# Patient Record
Sex: Female | Born: 1935 | Race: White | Hispanic: No | Smoking: Former smoker
Health system: Southern US, Community
[De-identification: ages and names within clinical notes are randomized; demographics above are authoritative.]

## PROBLEM LIST (undated history)

## (undated) DIAGNOSIS — J449 Chronic obstructive pulmonary disease, unspecified: Secondary | ICD-10-CM

## (undated) DIAGNOSIS — F329 Major depressive disorder, single episode, unspecified: Secondary | ICD-10-CM

## (undated) DIAGNOSIS — I10 Essential (primary) hypertension: Secondary | ICD-10-CM

## (undated) DIAGNOSIS — T7840XA Allergy, unspecified, initial encounter: Secondary | ICD-10-CM

## (undated) DIAGNOSIS — F419 Anxiety disorder, unspecified: Secondary | ICD-10-CM

## (undated) DIAGNOSIS — F32A Depression, unspecified: Secondary | ICD-10-CM

## (undated) HISTORY — DX: Anxiety disorder, unspecified: F41.9

## (undated) HISTORY — DX: Depression, unspecified: F32.A

## (undated) HISTORY — DX: Essential (primary) hypertension: I10

## (undated) HISTORY — DX: Chronic obstructive pulmonary disease, unspecified: J44.9

## (undated) HISTORY — PX: ABDOMINAL HYSTERECTOMY: SHX81

## (undated) HISTORY — DX: Allergy, unspecified, initial encounter: T78.40XA

---
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
## 2002-06-13 ENCOUNTER — Encounter: Admission: RE | Admit: 2002-06-13 | Discharge: 2002-06-13 | Payer: Self-pay | Admitting: Family Medicine

## 2002-06-13 ENCOUNTER — Encounter: Payer: Self-pay | Admitting: Family Medicine

## 2016-04-04 ENCOUNTER — Other Ambulatory Visit: Payer: Self-pay | Admitting: *Deleted

## 2016-04-07 MED ORDER — ALPRAZOLAM 0.5 MG PO TABS: 0.5000 mg | ORAL_TABLET | Freq: Two times a day (BID) | ORAL | 1 refills | Status: DC | PRN

## 2016-09-15 ENCOUNTER — Other Ambulatory Visit: Payer: Self-pay | Admitting: Physician Assistant

## 2016-09-25 NOTE — Patient Instructions (Signed)
Your procedure is scheduled on: 10/03/2016  Report to [facility] at  46  AM.  Call this number if you have problems the morning of surgery: [phone]   Do not eat food or drink liquids :After Midnight.      Take these medicines the morning of surgery with A SIP OF WATER: Norvasc and xanax (if needed).   Do not wear jewelry, make-up or nail polish.  Do not wear lotions, powders, or perfumes. You may wear deodorant.  Do not shave 48 hours prior to surgery.  Do not bring valuables to the hospital.  Contacts, dentures or bridgework may not be worn into surgery.  Leave suitcase in the car. After surgery it may be brought to your room.  For patients admitted to the hospital, checkout time is 11:00 AM the day of discharge.   Patients discharged the day of surgery will not be allowed to drive home.  :     Please read over the following fact sheets that you were given: Coughing and Deep Breathing, Surgical Site Infection Prevention, Anesthesia Post-op Instructions and Care and Recovery After Surgery    Cataract A cataract is a clouding of the lens of the eye. When a lens becomes cloudy, vision is reduced based on the degree and nature of the clouding. Many cataracts reduce vision to some degree. Some cataracts make people more near-sighted as they develop. Other cataracts increase glare. Cataracts that are ignored and become worse can sometimes look white. The white color can be seen through the pupil. CAUSES   Aging. However, cataracts may occur at any age, even in newborns.   Certain drugs.   Trauma to the eye.   Certain diseases such as diabetes.   Specific eye diseases such as chronic inflammation inside the eye or a sudden attack of a rare form of glaucoma.   Inherited or acquired medical problems.  SYMPTOMS   Gradual, progressive drop in vision in the affected eye.   Severe, rapid visual loss. This most often happens when trauma is the cause.  DIAGNOSIS  To detect a cataract,  an eye doctor examines the lens. Cataracts are best diagnosed with an exam of the eyes with the pupils enlarged (dilated) by drops.  TREATMENT  For an early cataract, vision may improve by using different eyeglasses or stronger lighting. If that does not help your vision, surgery is the only effective treatment. A cataract needs to be surgically removed when vision loss interferes with your everyday activities, such as driving, reading, or watching TV. A cataract may also have to be removed if it prevents examination or treatment of another eye problem. Surgery removes the cloudy lens and usually replaces it with a substitute lens (intraocular lens, IOL).  At a time when both you and your doctor agree, the cataract will be surgically removed. If you have cataracts in both eyes, only one is usually removed at a time. This allows the operated eye to heal and be out of danger from any possible problems after surgery (such as infection or poor wound healing). In rare cases, a cataract may be doing damage to your eye. In these cases, your caregiver may advise surgical removal right away. The vast majority of people who have cataract surgery have better vision afterward. HOME CARE INSTRUCTIONS  If you are not planning surgery, you may be asked to do the following:  Use different eyeglasses.   Use stronger or brighter lighting.   Ask your eye doctor about reducing  your medicine dose or changing medicines if it is thought that a medicine caused your cataract. Changing medicines does not make the cataract go away on its own.   Become familiar with your surroundings. Poor vision can lead to injury. Avoid bumping into things on the affected side. You are at a higher risk for tripping or falling.   Exercise extreme care when driving or operating machinery.   Wear sunglasses if you are sensitive to bright light or experiencing problems with glare.  SEEK IMMEDIATE MEDICAL CARE IF:   You have a worsening or  sudden vision loss.   You notice redness, swelling, or increasing pain in the eye.   You have a fever.  Document Released: 07/07/2005 Document Revised: 06/26/2011 Document Reviewed: 02/28/2011 [facility] Patient Information 2012 [place].PATIENT INSTRUCTIONS POST-ANESTHESIA  IMMEDIATELY FOLLOWING SURGERY:  Do not drive or operate machinery for the first twenty four hours after surgery.  Do not make any important decisions for twenty four hours after surgery or while taking narcotic pain medications or sedatives.  If you develop intractable nausea and vomiting or a severe headache please notify your doctor immediately.  FOLLOW-UP:  Please make an appointment with your surgeon as instructed. You do not need to follow up with anesthesia unless specifically instructed to do so.  WOUND CARE INSTRUCTIONS (if applicable):  Keep a dry clean dressing on the anesthesia/puncture wound site if there is drainage.  Once the wound has quit draining you may leave it open to air.  Generally you should leave the bandage intact for twenty four hours unless there is drainage.  If the epidural site drains for more than 36-48 hours please call the anesthesia department.  QUESTIONS?:  Please feel free to call your physician or the hospital operator if you have any questions, and they will be happy to assist you.

## 2016-09-26 ENCOUNTER — Encounter: Payer: Self-pay | Admitting: Physician Assistant

## 2016-09-26 ENCOUNTER — Ambulatory Visit (INDEPENDENT_AMBULATORY_CARE_PROVIDER_SITE_OTHER): Payer: Medicare Other | Admitting: Physician Assistant

## 2016-09-26 VITALS — BP 142/76 | HR 103 | Temp 96.7°F | Ht 61.0 in | Wt 109.8 lb

## 2016-09-26 DIAGNOSIS — N3000 Acute cystitis without hematuria: Secondary | ICD-10-CM | POA: Diagnosis not present

## 2016-09-26 DIAGNOSIS — R300 Dysuria: Secondary | ICD-10-CM | POA: Diagnosis not present

## 2016-09-26 DIAGNOSIS — I10 Essential (primary) hypertension: Secondary | ICD-10-CM | POA: Diagnosis not present

## 2016-09-26 DIAGNOSIS — K5900 Constipation, unspecified: Secondary | ICD-10-CM | POA: Diagnosis not present

## 2016-09-26 DIAGNOSIS — F419 Anxiety disorder, unspecified: Secondary | ICD-10-CM | POA: Insufficient documentation

## 2016-09-26 LAB — URINALYSIS, COMPLETE
Bilirubin, UA: NEGATIVE
Glucose, UA: NEGATIVE
Ketones, UA: NEGATIVE
Nitrite, UA: NEGATIVE
Protein, UA: NEGATIVE
Specific Gravity, UA: 1.01 (ref 1.005–1.030)
Urobilinogen, Ur: 0.2 mg/dL (ref 0.2–1.0)
pH, UA: 7 (ref 5.0–7.5)

## 2016-09-26 LAB — MICROSCOPIC EXAMINATION: Bacteria, UA: NONE SEEN

## 2016-09-26 MED ORDER — AMLODIPINE BESYLATE 5 MG PO TABS: 5.0000 mg | ORAL_TABLET | Freq: Every day | ORAL | 11 refills | Status: DC

## 2016-09-26 MED ORDER — POLYETHYLENE GLYCOL 3350 17 GM/SCOOP PO POWD: 17.0000 g | Freq: Once | ORAL | 1 refills | Status: AC

## 2016-09-26 MED ORDER — SULFAMETHOXAZOLE-TRIMETHOPRIM 800-160 MG PO TABS: 1.0000 | ORAL_TABLET | Freq: Two times a day (BID) | ORAL | 0 refills | Status: DC

## 2016-09-26 MED ORDER — ALPRAZOLAM 0.5 MG PO TABS: 0.5000 mg | ORAL_TABLET | Freq: Two times a day (BID) | ORAL | 2 refills | Status: DC | PRN

## 2016-09-26 MED ORDER — SENNA 8.6 MG PO TABS: 1.0000 | ORAL_TABLET | Freq: Every day | ORAL | 0 refills | Status: DC

## 2016-09-26 NOTE — Patient Instructions (Signed)
Constipation, Adult °Constipation is when a person: °· Poops (has a bowel movement) fewer times in a week than normal. °· Has a hard time pooping. °· Has poop that is dry, hard, or bigger than normal. ° °Follow these instructions at home: °Eating and drinking ° °· Eat foods that have a lot of fiber, such as: °? Fresh fruits and vegetables. °? Whole grains. °? Beans. °· Eat less of foods that are high in fat, low in fiber, or overly processed, such as: °? French fries. °? Hamburgers. °? Cookies. °? Candy. °? Soda. °· Drink enough fluid to keep your pee (urine) clear or pale yellow. °General instructions °· Exercise regularly or as told by your doctor. °· Go to the restroom when you feel like you need to poop. Do not hold it in. °· Take over-the-counter and prescription medicines only as told by your doctor. These include any fiber supplements. °· Do pelvic floor retraining exercises, such as: °? Doing deep breathing while relaxing your lower belly (abdomen). °? Relaxing your pelvic floor while pooping. °· Watch your condition for any changes. °· Keep all follow-up visits as told by your doctor. This is important. °Contact a doctor if: °· You have pain that gets worse. °· You have a fever. °· You have not pooped for 4 days. °· You throw up (vomit). °· You are not hungry. °· You lose weight. °· You are bleeding from the anus. °· You have thin, pencil-like poop (stool). °Get help right away if: °· You have a fever, and your symptoms suddenly get worse. °· You leak poop or have blood in your poop. °· Your belly feels hard or bigger than normal (is bloated). °· You have very bad belly pain. °· You feel dizzy or you faint. °This information is not intended to replace advice given to you by your health care provider. Make sure you discuss any questions you have with your health care provider. °Document Released: 12/24/2007 Document Revised: 01/25/2016 Document Reviewed: 12/26/2015 °Elsevier Interactive Patient Education ©  2017 [employer]. ° °

## 2016-09-26 NOTE — Progress Notes (Signed)
BP (!) 142/76   Pulse (!) 103   Temp (!) 96.7 F (35.9 C) (Oral)   Ht 5\' 1"  (1.549 m)   Wt 109 lb 12.8 oz (49.8 kg)   BMI 20.75 kg/m    Subjective:    Patient ID: Kelly Peters, female    DOB: March 20, 1930, 83 y.o.   MRN: [id number]  HPI: Kelly Peters is a 83 y.o. female presenting on 09/26/2016 for Dysuria; Urinary Frequency; and Urinary Tract Infection  This patient comes in for periodic recheck on medications and conditions including UTI, constipation, hypertension, anxiety.   Urine buring and frequency for one week. Denies fever or chills. Denies blood in urine. Nocturia 2-3 times. Has chronic constipation and had a very difficult time with a bowel movement in the days before the UTI symptoms.  Denies soiling. Has not been routinely caring for constipation.  All medications are reviewed today. There are no reports of any problems with the medications. All of the medical conditions are reviewed and updated.  Lab work is reviewed and will be ordered as medically necessary. There are no new problems reported with today's visit.   Past Medical History:  Diagnosis Date  . Hypertension    Relevant past medical, surgical, family and social history reviewed and updated as indicated. Interim medical history since our last visit reviewed. Allergies and medications reviewed and updated. DATA REVIEWED: CHART IN EPIC  Social History   Social History  . Marital status: Widowed    Spouse name: N/A  . Number of children: N/A  . Years of education: N/A   Occupational History  . Not on file.   Social History Main Topics  . Smoking status: Current Every Day Smoker  . Smokeless tobacco: Never Used  . Alcohol use No  . Drug use: No  . Sexual activity: Not on file   Other Topics Concern  . Not on file   Social History Narrative  . No narrative on file    History reviewed. No pertinent surgical history.  History reviewed. No pertinent family history.  Review of Systems    Constitutional: Negative.  Negative for activity change, fatigue and fever.  HENT: Negative.   Eyes: Negative.   Respiratory: Negative.  Negative for cough.   Cardiovascular: Negative.  Negative for chest pain.  Gastrointestinal: Positive for abdominal pain, constipation and rectal pain. Negative for anal bleeding, blood in stool, nausea and vomiting.  Endocrine: Negative.   Genitourinary: Positive for dysuria, frequency and urgency.  Musculoskeletal: Negative.   Skin: Negative.   Neurological: Negative.     Allergies as of 09/26/2016      Reactions   Penicillins Rash   Has patient had a PCN reaction causing immediate rash, facial/tongue/throat swelling, SOB or lightheadedness with hypotension:No Has patient had a PCN reaction causing severe rash involving mucus membranes or skin necrosis:Yes Has patient had a PCN reaction that required hospitalization:No Has patient had a PCN reaction occurring within the last 10 years:No If all of the above answers are "NO", then may proceed with Cephalosporin use.      Medication List       Accurate as of 09/26/16 12:20 PM. Always use your most recent med list.          ALPRAZolam 0.5 MG tablet Commonly known as:  XANAX Take 1 tablet (0.5 mg total) by mouth 2 (two) times daily as needed for anxiety.   amLODipine 5 MG tablet Commonly known as:  NORVASC Take 1 tablet (  5 mg total) by mouth daily.   aspirin EC 81 MG tablet Take 81 mg by mouth daily.   polyethylene glycol powder powder Commonly known as:  GLYCOLAX/MIRALAX Take 17 g by mouth once. Take 1/2 cap daily   senna 8.6 MG Tabs tablet Commonly known as:  SENOKOT Take 1 tablet (8.6 mg total) by mouth daily.   sulfamethoxazole-trimethoprim 800-160 MG tablet Commonly known as:  BACTRIM DS Take 1 tablet by mouth 2 (two) times daily.          Objective:    BP (!) 142/76   Pulse (!) 103   Temp (!) 96.7 F (35.9 C) (Oral)   Ht 5\' 1"  (1.549 m)   Wt 109 lb 12.8 oz (49.8 kg)    BMI 20.75 kg/m   Allergies  Allergen Reactions  . Penicillins Rash    Has patient had a PCN reaction causing immediate rash, facial/tongue/throat swelling, SOB or lightheadedness with hypotension:No Has patient had a PCN reaction causing severe rash involving mucus membranes or skin necrosis:Yes Has patient had a PCN reaction that required hospitalization:No Has patient had a PCN reaction occurring within the last 10 years:No If all of the above answers are "NO", then may proceed with Cephalosporin use.     Wt Readings from Last 3 Encounters:  09/26/16 109 lb 12.8 oz (49.8 kg)    Physical Exam  Constitutional: She is oriented to person, place, and time. She appears well-developed and well-nourished.  HENT:  Head: Normocephalic and atraumatic.  Eyes: Conjunctivae are normal. Pupils are equal, round, and reactive to light.  Cardiovascular: Normal rate, regular rhythm, normal heart sounds and intact distal pulses.   Pulmonary/Chest: Effort normal and breath sounds normal.  Abdominal: Soft. Bowel sounds are normal. She exhibits no distension and no mass. There is tenderness in the suprapubic area. There is no rebound, no guarding and no CVA tenderness.  Neurological: She is alert and oriented to person, place, and time. She has normal reflexes.  Skin: Skin is warm and dry. No rash noted.  Psychiatric: She has a normal mood and affect. Her behavior is normal. Judgment and thought content normal.  Nursing note and vitals reviewed.   No results found for this or any previous visit.    Assessment & Plan:   1. Dysuria - Urine culture - Urinalysis, Complete - sulfamethoxazole-trimethoprim (BACTRIM DS) 800-160 MG tablet; Take 1 tablet by mouth 2 (two) times daily.  Dispense: 14 tablet; Refill: 0  2. Essential hypertension - amLODipine (NORVASC) 5 MG tablet; Take 1 tablet (5 mg total) by mouth daily.  Dispense: 30 tablet; Refill: 11  3. Anxiety - ALPRAZolam (XANAX) 0.5 MG tablet;  Take 1 tablet (0.5 mg total) by mouth 2 (two) times daily as needed for anxiety.  Dispense: 60 tablet; Refill: 2  4. Constipation, unspecified constipation type - polyethylene glycol powder (GLYCOLAX/MIRALAX) powder; Take 17 g by mouth once. Take 1/2 cap daily  Dispense: 3350 g; Refill: 1 - senna (SENOKOT) 8.6 MG TABS tablet; Take 1 tablet (8.6 mg total) by mouth daily.  Dispense: 120 each; Refill: 0  5. Acute cystitis without hematuria - sulfamethoxazole-trimethoprim (BACTRIM DS) 800-160 MG tablet; Take 1 tablet by mouth 2 (two) times daily.  Dispense: 14 tablet; Refill: 0   Continue all other maintenance medications as listed above.  Follow up plan: Return in about 6 months (around 03/29/2017) for recheck.  Educational handout given for constipation  Terald Sleeper PA-C [facility] [address]  [place], [place] [address] [phone]   09/26/2016, 12:20 PM

## 2016-09-29 LAB — URINE CULTURE

## 2016-09-30 ENCOUNTER — Encounter (HOSPITAL_COMMUNITY)
Admission: RE | Admit: 2016-09-30 | Discharge: 2016-09-30 | Disposition: A | Discharge status: Home / Self Care | Payer: Medicare Other | Source: Ambulatory Visit | Attending: Ophthalmology | Admitting: Ophthalmology

## 2016-09-30 ENCOUNTER — Encounter (HOSPITAL_COMMUNITY): Payer: Self-pay

## 2016-09-30 DIAGNOSIS — H2512 Age-related nuclear cataract, left eye: Secondary | ICD-10-CM | POA: Diagnosis present

## 2016-09-30 DIAGNOSIS — I10 Essential (primary) hypertension: Secondary | ICD-10-CM | POA: Diagnosis not present

## 2016-09-30 DIAGNOSIS — F419 Anxiety disorder, unspecified: Secondary | ICD-10-CM | POA: Diagnosis not present

## 2016-09-30 DIAGNOSIS — Z79899 Other long term (current) drug therapy: Secondary | ICD-10-CM | POA: Diagnosis not present

## 2016-09-30 DIAGNOSIS — F17200 Nicotine dependence, unspecified, uncomplicated: Secondary | ICD-10-CM | POA: Diagnosis not present

## 2016-09-30 LAB — CBC WITH DIFFERENTIAL/PLATELET
Basophils Absolute: 0.1 10*3/uL (ref 0.0–0.1)
Basophils Relative: 1 %
EOS ABS: 0.1 10*3/uL (ref 0.0–0.7)
Eosinophils Relative: 1 %
HCT: 38.6 % (ref 36.0–46.0)
HEMOGLOBIN: 12.8 g/dL (ref 12.0–15.0)
LYMPHS ABS: 1.1 10*3/uL (ref 0.7–4.0)
LYMPHS PCT: 16 %
MCH: 31.2 pg (ref 26.0–34.0)
MCHC: 33.2 g/dL (ref 30.0–36.0)
MCV: 94.1 fL (ref 78.0–100.0)
Monocytes Absolute: 0.7 10*3/uL (ref 0.1–1.0)
Monocytes Relative: 10 %
NEUTROS PCT: 72 %
Neutro Abs: 4.8 10*3/uL (ref 1.7–7.7)
Platelets: 236 10*3/uL (ref 150–400)
RBC: 4.1 MIL/uL (ref 3.87–5.11)
RDW: 14 % (ref 11.5–15.5)
WBC: 6.7 10*3/uL (ref 4.0–10.5)

## 2016-09-30 LAB — BASIC METABOLIC PANEL
Anion gap: 12 (ref 5–15)
BUN: 14 mg/dL (ref 6–20)
CHLORIDE: 97 mmol/L — AB (ref 101–111)
CO2: 24 mmol/L (ref 22–32)
Calcium: 9.2 mg/dL (ref 8.9–10.3)
Creatinine, Ser: 1.57 mg/dL — ABNORMAL HIGH (ref 0.44–1.00)
GFR calc Af Amer: 34 mL/min — ABNORMAL LOW (ref >60)
GFR calc non Af Amer: 29 mL/min — ABNORMAL LOW (ref >60)
Glucose, Bld: 88 mg/dL (ref 65–99)
POTASSIUM: 3.6 mmol/L (ref 3.5–5.1)
SODIUM: 133 mmol/L — AB (ref 135–145)

## 2016-10-02 MED ORDER — CYCLOPENTOLATE-PHENYLEPHRINE 0.2-1 % OP SOLN
OPHTHALMIC | Status: AC
  Filled 2016-10-02: qty 2

## 2016-10-02 MED ORDER — LIDOCAINE HCL 3.5 % OP GEL
OPHTHALMIC | Status: AC
  Filled 2016-10-02: qty 1

## 2016-10-02 MED ORDER — PHENYLEPHRINE HCL 2.5 % OP SOLN
OPHTHALMIC | Status: AC
  Filled 2016-10-02: qty 15

## 2016-10-02 MED ORDER — TETRACAINE HCL 0.5 % OP SOLN
OPHTHALMIC | Status: AC
  Filled 2016-10-02: qty 4

## 2016-10-02 MED ORDER — LIDOCAINE HCL (PF) 1 % IJ SOLN
INTRAMUSCULAR | Status: AC
  Filled 2016-10-02: qty 2

## 2016-10-02 MED ORDER — NEOMYCIN-POLYMYXIN-DEXAMETH 3.5-10000-0.1 OP SUSP
OPHTHALMIC | Status: AC
  Filled 2016-10-02: qty 5

## 2016-10-03 ENCOUNTER — Encounter (HOSPITAL_COMMUNITY)
Admission: RE | Disposition: A | Discharge status: Home / Self Care | Payer: Self-pay | Source: Ambulatory Visit | Attending: Ophthalmology

## 2016-10-03 ENCOUNTER — Ambulatory Visit (HOSPITAL_COMMUNITY)
Admission: RE | Admit: 2016-10-03 | Discharge: 2016-10-03 | Disposition: A | Discharge status: Home / Self Care | Payer: Medicare Other | Source: Ambulatory Visit | Attending: Ophthalmology | Admitting: Ophthalmology

## 2016-10-03 ENCOUNTER — Ambulatory Visit (HOSPITAL_COMMUNITY): Payer: Medicare Other | Admitting: Anesthesiology

## 2016-10-03 DIAGNOSIS — I10 Essential (primary) hypertension: Secondary | ICD-10-CM | POA: Diagnosis not present

## 2016-10-03 DIAGNOSIS — H2512 Age-related nuclear cataract, left eye: Secondary | ICD-10-CM | POA: Insufficient documentation

## 2016-10-03 DIAGNOSIS — Z79899 Other long term (current) drug therapy: Secondary | ICD-10-CM | POA: Insufficient documentation

## 2016-10-03 DIAGNOSIS — F419 Anxiety disorder, unspecified: Secondary | ICD-10-CM | POA: Insufficient documentation

## 2016-10-03 DIAGNOSIS — F17200 Nicotine dependence, unspecified, uncomplicated: Secondary | ICD-10-CM | POA: Insufficient documentation

## 2016-10-03 HISTORY — PX: CATARACT EXTRACTION W/PHACO: SHX586

## 2016-10-03 SURGERY — PHACOEMULSIFICATION, CATARACT, WITH IOL INSERTION
Anesthesia: Monitor Anesthesia Care | Site: Eye | Laterality: Left

## 2016-10-03 MED ORDER — CYCLOPENTOLATE-PHENYLEPHRINE 0.2-1 % OP SOLN
1.0000 [drp] | OPHTHALMIC | Status: AC
  Administered 2016-10-03 (×3): 1 [drp] via OPHTHALMIC

## 2016-10-03 MED ORDER — TETRACAINE HCL 0.5 % OP SOLN
1.0000 [drp] | OPHTHALMIC | Status: AC
  Administered 2016-10-03 (×3): 1 [drp] via OPHTHALMIC

## 2016-10-03 MED ORDER — EPINEPHRINE PF 1 MG/ML IJ SOLN
INTRAMUSCULAR | Status: AC
  Filled 2016-10-03: qty 1

## 2016-10-03 MED ORDER — BSS IO SOLN
INTRAOCULAR | Status: DC | PRN
  Administered 2016-10-03: 15 mL via INTRAOCULAR

## 2016-10-03 MED ORDER — PHENYLEPHRINE HCL 2.5 % OP SOLN
1.0000 [drp] | OPHTHALMIC | Status: AC
  Administered 2016-10-03 (×3): 1 [drp] via OPHTHALMIC

## 2016-10-03 MED ORDER — SODIUM HYALURONATE 23 MG/ML IO SOLN
INTRAOCULAR | Status: DC | PRN
  Administered 2016-10-03: 0.6 mL via INTRAOCULAR

## 2016-10-03 MED ORDER — LIDOCAINE HCL 3.5 % OP GEL
1.0000 "application " | Freq: Once | OPHTHALMIC | Status: AC
  Administered 2016-10-03: 1 via OPHTHALMIC

## 2016-10-03 MED ORDER — FENTANYL CITRATE (PF) 100 MCG/2ML IJ SOLN
25.0000 ug | Freq: Once | INTRAMUSCULAR | Status: AC
  Administered 2016-10-03: 25 ug via INTRAVENOUS

## 2016-10-03 MED ORDER — FENTANYL CITRATE (PF) 100 MCG/2ML IJ SOLN
INTRAMUSCULAR | Status: AC
  Filled 2016-10-03: qty 2

## 2016-10-03 MED ORDER — NEOMYCIN-POLYMYXIN-DEXAMETH 3.5-10000-0.1 OP SUSP
OPHTHALMIC | Status: DC | PRN
  Administered 2016-10-03: 2 [drp] via OPHTHALMIC

## 2016-10-03 MED ORDER — EPINEPHRINE PF 1 MG/ML IJ SOLN
INTRAMUSCULAR | Status: DC | PRN
  Administered 2016-10-03: 500 mL

## 2016-10-03 MED ORDER — LACTATED RINGERS IV SOLN
INTRAVENOUS | Status: DC
  Administered 2016-10-03: 1000 mL via INTRAVENOUS

## 2016-10-03 MED ORDER — POVIDONE-IODINE 5 % OP SOLN
OPHTHALMIC | Status: DC | PRN
  Administered 2016-10-03: 1 via OPHTHALMIC

## 2016-10-03 MED ORDER — MIDAZOLAM HCL 2 MG/2ML IJ SOLN
INTRAMUSCULAR | Status: AC
  Filled 2016-10-03: qty 2

## 2016-10-03 MED ORDER — PROVISC 10 MG/ML IO SOLN
INTRAOCULAR | Status: DC | PRN
  Administered 2016-10-03: 0.85 mL via INTRAOCULAR

## 2016-10-03 MED ORDER — LIDOCAINE HCL (PF) 1 % IJ SOLN
INTRAOCULAR | Status: DC | PRN
  Administered 2016-10-03: .9 mL via OPHTHALMIC

## 2016-10-03 MED ORDER — MIDAZOLAM HCL 2 MG/2ML IJ SOLN
1.0000 mg | INTRAMUSCULAR | Status: AC
  Administered 2016-10-03: 2 mg via INTRAVENOUS

## 2016-10-03 SURGICAL SUPPLY — 18 items
CLOTH BEACON ORANGE TIMEOUT ST (SAFETY) ×3 IMPLANT
EYE SHIELD UNIVERSAL CLEAR (GAUZE/BANDAGES/DRESSINGS) ×3 IMPLANT
GLOVE BIOGEL PI IND STRL 6.5 (GLOVE) ×1 IMPLANT
GLOVE BIOGEL PI IND STRL 7.0 (GLOVE) ×1 IMPLANT
GLOVE BIOGEL PI IND STRL 7.5 (GLOVE) ×1 IMPLANT
GLOVE BIOGEL PI INDICATOR 6.5 (GLOVE) ×2
GLOVE BIOGEL PI INDICATOR 7.0 (GLOVE) ×2
GLOVE BIOGEL PI INDICATOR 7.5 (GLOVE) ×2
GLOVE EXAM NITRILE LRG STRL (GLOVE) ×3 IMPLANT
GLOVE EXAM NITRILE MD LF STRL (GLOVE) IMPLANT
NEEDLE HYPO 18GX1.5 BLUNT FILL (NEEDLE) ×3 IMPLANT
PAD ARMBOARD 7.5X6 YLW CONV (MISCELLANEOUS) ×3 IMPLANT
RING MALYGIN (MISCELLANEOUS) IMPLANT
SIGHTPATH CAT PROC W REG LENS (Ophthalmic Related) ×3 IMPLANT
SYR TB 1ML LL NO SAFETY (SYRINGE) ×3 IMPLANT
TAPE TRANSPARENT 1/2IN (GAUZE/BANDAGES/DRESSINGS) ×3 IMPLANT
VISCOELASTIC ADDITIONAL (OPHTHALMIC RELATED) IMPLANT
WATER STERILE IRR 250ML POUR (IV SOLUTION) ×3 IMPLANT

## 2016-10-03 NOTE — H&P (Signed)
The H and P was reviewed and updated. The patient was examined.  No changes were found after exam.  The surgical eye was marked.  

## 2016-10-03 NOTE — Anesthesia Procedure Notes (Signed)
Procedure Name: MAC Date/Time: 10/03/2016 7:30 AM Performed by: Vista Deck Pre-anesthesia Checklist: Patient identified, Emergency Drugs available, Suction available, Timeout performed and Patient being monitored Patient Re-evaluated:Patient Re-evaluated prior to inductionOxygen Delivery Method: Nasal Cannula

## 2016-10-03 NOTE — Anesthesia Postprocedure Evaluation (Signed)
Anesthesia Post Note  Patient: Kelly Peters  Procedure(s) Performed: Procedure(s) (LRB): CATARACT EXTRACTION PHACO AND INTRAOCULAR LENS PLACEMENT LEFT EYE CDE - 25.42 (Left)  Patient location during evaluation: Short Stay Anesthesia Type: MAC Level of consciousness: awake and alert Pain management: pain level controlled Vital Signs Assessment: post-procedure vital signs reviewed and stable Respiratory status: spontaneous breathing Anesthetic complications: no     Last Vitals:  Vitals:   10/03/16 0700 10/03/16 0705  BP: (!) 106/54 (!) 114/57  Resp: (!) 30 16  Temp:      Last Pain: There were no vitals filed for this visit.               Drucie Opitz

## 2016-10-03 NOTE — Discharge Instructions (Signed)
Please discharge patient when stable, will follow up today with Dr. Marisa Hua at the [facility] office at 11:15AM.  Leave shield in place until visit.  All paperwork with discharge instructions will be given at the office.

## 2016-10-03 NOTE — Op Note (Signed)
Date of procedure:   Pre-operative diagnosis: Visually significant cataract, Left Eye  Post-operative diagnosis: Visually significant cataract, Left Eye  Procedure: Removal of cataract via phacoemulsification and insertion of intra-ocular lens AMO PCB00 +26.5D into the capsular bag of the Left Eye  Attending surgeon: Gerda Diss. Chesnie Capell, MD, MA  Anesthesia: MAC, Topical Akten  Complications: None  Estimated Blood Loss: <81m (minimal)  Specimens: None  Implants: As above  Indications:  Visually significant cataract, Left Eye  Procedure:  The patient was seen and identified in the pre-operative area. The operative eye was identified and dilated.  The operative eye was marked.  Topical anesthesia was administered to the operative eye.     The patient was then to the operative suite and placed in the supine position.  A timeout was performed confirming the patient, procedure to be performed, and all other relevant information.   The patient's face was prepped and draped in the usual fashion for intra-ocular surgery.  A lid speculum was placed into the operative eye and the surgical microscope moved into place and focused.  A superotemporal paracentesis was created using a 20 gauge paracentesis blade.  Shugarcaine was injected into the anterior chamber.  Viscoelastic was injected into the anterior chamber.  A temporal clear-corneal main wound incision was created using a 2.461mmicrokeratome.  A continuous curvilinear capsulorrhexis was initiated using an irrigating cystitome and completed using capsulorrhexis forceps.  Hydrodissection and hydrodeliniation were performed.  Viscoelastic was injected into the anterior chamber.  A phacoemulsification handpiece and a chopper as a second instrument were used to remove the nucleus and epinucleus. The irrigation/aspiration handpiece was used to remove any remaining cortical material.   The capsular bag was reinflated with viscoelastic, checked, and found  to be intact. The AMO PCB00 +26.5 intraocular lens was inserted into the capsular bag and dialed into place using a Sinskey hook.  The irrigation/aspiration handpiece was used to remove any remaining viscoelastic.  The clear corneal wound and paracentesis wounds were then hydrated and checked with Weck-Cels to be watertight.  The lid-speculum and drape was removed, and the patient's face was cleaned with a wet and dry 4x4.  Maxitrol was instilled in the eye before a clear shield was taped over the eye. The patient was taken to the post-operative care unit in good condition, having tolerated the procedure well.  Post-Op Instructions: [facility] and will receive all other orders and instructions.

## 2016-10-03 NOTE — Transfer of Care (Signed)
Immediate Anesthesia Transfer of Care Note  Patient: Kelly Peters  Procedure(s) Performed: Procedure(s) (LRB): CATARACT EXTRACTION PHACO AND INTRAOCULAR LENS PLACEMENT LEFT EYE CDE - 25.42 (Left)  Patient Location: Shortstay  Anesthesia Type: MAC  Level of Consciousness: awake  Airway & Oxygen Therapy: Patient Spontanous Breathing   Post-op Assessment: Report given to PACU RN, Post -op Vital signs reviewed and stable and Patient moving all extremities  Post vital signs: Reviewed and stable  Complications: No apparent anesthesia complications

## 2016-10-03 NOTE — Anesthesia Preprocedure Evaluation (Signed)
Anesthesia Evaluation  Patient identified by MRN, date of birth, ID band Patient awake    Reviewed: Allergy & Precautions, NPO status , Patient's Chart, lab work & pertinent test results  Airway Mallampati: I  TM Distance: >3 FB Neck ROM: Full    Dental  (+) Edentulous Upper   Pulmonary Current Smoker,    breath sounds clear to auscultation       Cardiovascular hypertension, Pt. on medications  Rhythm:Regular Rate:Normal     Neuro/Psych PSYCHIATRIC DISORDERS Anxiety    GI/Hepatic negative GI ROS, Neg liver ROS,   Endo/Other    Renal/GU negative Renal ROS     Musculoskeletal   Abdominal   Peds  Hematology   Anesthesia Other Findings   Reproductive/Obstetrics                             Anesthesia Physical Anesthesia Plan  ASA: II  Anesthesia Plan: MAC   Post-op Pain Management:    Induction: Intravenous  Airway Management Planned: Nasal Cannula  Additional Equipment:   Intra-op Plan:   Post-operative Plan:   Informed Consent: I have reviewed the patients History and Physical, chart, labs and discussed the procedure including the risks, benefits and alternatives for the proposed anesthesia with the patient or authorized representative who has indicated his/her understanding and acceptance.     Plan Discussed with:   Anesthesia Plan Comments:         Anesthesia Quick Evaluation  

## 2016-10-06 ENCOUNTER — Encounter (HOSPITAL_COMMUNITY): Payer: Self-pay | Admitting: Ophthalmology

## 2016-10-16 ENCOUNTER — Ambulatory Visit (INDEPENDENT_AMBULATORY_CARE_PROVIDER_SITE_OTHER): Payer: Medicare Other | Admitting: Family Medicine

## 2016-10-16 ENCOUNTER — Encounter: Payer: Self-pay | Admitting: Family Medicine

## 2016-10-16 ENCOUNTER — Telehealth: Payer: Self-pay | Admitting: Physician Assistant

## 2016-10-16 VITALS — BP 128/78 | HR 98 | Temp 97.0°F | Ht 61.0 in | Wt 108.4 lb

## 2016-10-16 DIAGNOSIS — N3000 Acute cystitis without hematuria: Secondary | ICD-10-CM | POA: Diagnosis not present

## 2016-10-16 LAB — URINALYSIS, COMPLETE
Bilirubin, UA: NEGATIVE
Glucose, UA: NEGATIVE
Ketones, UA: NEGATIVE
Nitrite, UA: NEGATIVE
PH UA: 6.5 (ref 5.0–7.5)
PROTEIN UA: NEGATIVE
RBC, UA: NEGATIVE
Specific Gravity, UA: 1.01 (ref 1.005–1.030)
Urobilinogen, Ur: 1 mg/dL (ref 0.2–1.0)

## 2016-10-16 LAB — MICROSCOPIC EXAMINATION: RENAL EPITHEL UA: NONE SEEN /HPF

## 2016-10-16 MED ORDER — CIPROFLOXACIN HCL 250 MG PO TABS: 250.0000 mg | ORAL_TABLET | Freq: Two times a day (BID) | ORAL | 0 refills | Status: DC

## 2016-10-16 NOTE — Progress Notes (Signed)
   HPI  Patient presents today here with dysuria.  Patient states that she was treated for UTI earlier this month, she did complete resolution after that and has had return of symptoms about one week ago.  She describes dysuria and increased urinary frequency. She denies any foul-smelling urine or change in the appearance of urine.  She does not have any new abdominal pain, back pain. She does not have any fevers or chills. She states that she is cold natured and stays cold most the time.  She is tolerating foods and fluids normally.    PMH: Smoking status noted ROS: Per HPI  Objective: BP 128/78   Pulse 98   Temp 97 F (36.1 C) (Oral)   Ht 5\' 1"  (1.549 m)   Wt 108 lb 6.4 oz (49.2 kg)   BMI 20.48 kg/m  Gen: NAD, alert, cooperative with exam HEENT: NCAT CV: RRR, good S1/S2, no murmur Resp: CTABL, no wheezes, non-labored Abd: SNTND, BS present, no guarding or organomegaly, no CVA tenderness, no suprapubic tenderness Ext: No edema, warm Neuro: Alert and oriented, No gross deficits  Assessment and plan:  # UTI UA C/w UTI, Treat with cipro ( bactrim given earlier this month), seven-day duration given age Culture Non septic RTC with any concerns   Orders Placed This Encounter  Procedures  . Urine culture  . Urinalysis, Complete    Meds ordered this encounter  Medications  . ciprofloxacin (CIPRO) 250 MG tablet    Sig: Take 1 tablet (250 mg total) by mouth 2 (two) times daily.    Dispense:  14 tablet    Refill:  0    Laroy Apple, MD [facility] 10/16/2016, 11:07 AM

## 2016-10-16 NOTE — Telephone Encounter (Signed)
What is the name of the medication? Bactrim. syptoms from 3-9 are not gone yet. Thinks she needs a refill. Saw angel for this.  Have you contacted your pharmacy to request a refill? no  Which pharmacy would you like this sent to? The drug store in [place]   Patient notified that their request is being sent to the clinical staff for review and that they should receive a call once it is complete. If they do not receive a call within 24 hours they can check with their pharmacy or our office.

## 2016-10-16 NOTE — Patient Instructions (Signed)
Great to meet you!   Urinary Tract Infection, Adult A urinary tract infection (UTI) is an infection of any part of the urinary tract, which includes the kidneys, ureters, bladder, and urethra. These organs make, store, and get rid of urine in the body. UTI can be a bladder infection (cystitis) or kidney infection (pyelonephritis). What are the causes? This infection may be caused by fungi, viruses, or bacteria. Bacteria are the most common cause of UTIs. This condition can also be caused by repeated incomplete emptying of the bladder during urination. What increases the risk? This condition is more likely to develop if:  You ignore your need to urinate or hold urine for long periods of time.  You do not empty your bladder completely during urination.  You wipe back to front after urinating or having a bowel movement, if you are female.  You are uncircumcised, if you are female.  You are constipated.  You have a urinary catheter that stays in place (indwelling).  You have a weak defense (immune) system.  You have a medical condition that affects your bowels, kidneys, or bladder.  You have diabetes.  You take antibiotic medicines frequently or for long periods of time, and the antibiotics no longer work well against certain types of infections (antibiotic resistance).  You take medicines that irritate your urinary tract.  You are exposed to chemicals that irritate your urinary tract.  You are female. What are the signs or symptoms? Symptoms of this condition include:  Fever.  Frequent urination or passing small amounts of urine frequently.  Needing to urinate urgently.  Pain or burning with urination.  Urine that smells bad or unusual.  Cloudy urine.  Pain in the lower abdomen or back.  Trouble urinating.  Blood in the urine.  Vomiting or being less hungry than normal.  Diarrhea or abdominal pain.  Vaginal discharge, if you are female. How is this  diagnosed? This condition is diagnosed with a medical history and physical exam. You will also need to provide a urine sample to test your urine. Other tests may be done, including:  Blood tests.  Sexually transmitted disease (STD) testing. If you have had more than one UTI, a cystoscopy or imaging studies may be done to determine the cause of the infections. How is this treated? Treatment for this condition often includes a combination of two or more of the following:  Antibiotic medicine.  Other medicines to treat less common causes of UTI.  Over-the-counter medicines to treat pain.  Drinking enough water to stay hydrated. Follow these instructions at home:  Take over-the-counter and prescription medicines only as told by your health care provider.  If you were prescribed an antibiotic, take it as told by your health care provider. Do not stop taking the antibiotic even if you start to feel better.  Avoid alcohol, caffeine, tea, and carbonated beverages. They can irritate your bladder.  Drink enough fluid to keep your urine clear or pale yellow.  Keep all follow-up visits as told by your health care provider. This is important.  Make sure to:  Empty your bladder often and completely. Do not hold urine for long periods of time.  Empty your bladder before and after sex.  Wipe from front to back after a bowel movement if you are female. Use each tissue one time when you wipe. Contact a health care provider if:  You have back pain.  You have a fever.  You feel nauseous or vomit.  Your symptoms  vomit.  Your symptoms do not get better after 3 days.  Your symptoms go away and then return. Get help right away if:  You have severe back pain or lower abdominal pain.  You are vomiting and cannot keep down any medicines or water. This information is not intended to replace advice given to you by your health care provider. Make sure you discuss any questions you have with your health care  provider. Document Released: 04/16/2005 Document Revised: 12/19/2015 Document Reviewed: 05/28/2015 Elsevier Interactive Patient Education  2017 [employer]  

## 2016-10-16 NOTE — Telephone Encounter (Signed)
Patient advised that she needs to be seen and appointment scheduled.

## 2016-10-22 ENCOUNTER — Encounter (HOSPITAL_COMMUNITY): Payer: Self-pay

## 2016-10-22 LAB — URINE CULTURE

## 2016-10-23 ENCOUNTER — Encounter (HOSPITAL_COMMUNITY)
Admission: RE | Admit: 2016-10-23 | Discharge: 2016-10-23 | Disposition: A | Discharge status: Home / Self Care | Payer: Medicare Other | Source: Ambulatory Visit | Attending: Ophthalmology | Admitting: Ophthalmology

## 2016-10-24 ENCOUNTER — Ambulatory Visit (HOSPITAL_COMMUNITY)
Admission: RE | Admit: 2016-10-24 | Discharge: 2016-10-24 | Disposition: A | Discharge status: Home / Self Care | Payer: Medicare Other | Source: Ambulatory Visit | Attending: Ophthalmology | Admitting: Ophthalmology

## 2016-10-24 ENCOUNTER — Ambulatory Visit (HOSPITAL_COMMUNITY): Payer: Medicare Other | Admitting: Anesthesiology

## 2016-10-24 ENCOUNTER — Encounter (HOSPITAL_COMMUNITY)
Admission: RE | Disposition: A | Discharge status: Home / Self Care | Payer: Self-pay | Source: Ambulatory Visit | Attending: Ophthalmology

## 2016-10-24 ENCOUNTER — Encounter (HOSPITAL_COMMUNITY): Payer: Self-pay | Admitting: *Deleted

## 2016-10-24 DIAGNOSIS — F17200 Nicotine dependence, unspecified, uncomplicated: Secondary | ICD-10-CM | POA: Insufficient documentation

## 2016-10-24 DIAGNOSIS — H2511 Age-related nuclear cataract, right eye: Secondary | ICD-10-CM | POA: Diagnosis present

## 2016-10-24 DIAGNOSIS — I10 Essential (primary) hypertension: Secondary | ICD-10-CM | POA: Insufficient documentation

## 2016-10-24 DIAGNOSIS — Z79899 Other long term (current) drug therapy: Secondary | ICD-10-CM | POA: Insufficient documentation

## 2016-10-24 DIAGNOSIS — F419 Anxiety disorder, unspecified: Secondary | ICD-10-CM | POA: Diagnosis not present

## 2016-10-24 HISTORY — PX: CATARACT EXTRACTION W/PHACO: SHX586

## 2016-10-24 SURGERY — PHACOEMULSIFICATION, CATARACT, WITH IOL INSERTION
Anesthesia: Monitor Anesthesia Care | Laterality: Right

## 2016-10-24 MED ORDER — LIDOCAINE HCL 3.5 % OP GEL: 1.0000 "application " | Freq: Once | OPHTHALMIC | Status: DC

## 2016-10-24 MED ORDER — TETRACAINE HCL 0.5 % OP SOLN
1.0000 [drp] | OPHTHALMIC | Status: AC
  Administered 2016-10-24 (×3): 1 [drp] via OPHTHALMIC

## 2016-10-24 MED ORDER — FENTANYL CITRATE (PF) 100 MCG/2ML IJ SOLN
INTRAMUSCULAR | Status: AC
  Filled 2016-10-24: qty 2

## 2016-10-24 MED ORDER — FENTANYL CITRATE (PF) 100 MCG/2ML IJ SOLN
25.0000 ug | Freq: Once | INTRAMUSCULAR | Status: AC
  Administered 2016-10-24: 25 ug via INTRAVENOUS

## 2016-10-24 MED ORDER — PROVISC 10 MG/ML IO SOLN
INTRAOCULAR | Status: DC | PRN
  Administered 2016-10-24: 0.85 mL via INTRAOCULAR

## 2016-10-24 MED ORDER — LACTATED RINGERS IV SOLN
INTRAVENOUS | Status: DC
  Administered 2016-10-24: 1000 mL via INTRAVENOUS

## 2016-10-24 MED ORDER — BSS IO SOLN
INTRAOCULAR | Status: DC | PRN
  Administered 2016-10-24: 15 mL via INTRAOCULAR

## 2016-10-24 MED ORDER — EPINEPHRINE PF 1 MG/ML IJ SOLN
INTRAMUSCULAR | Status: AC
  Filled 2016-10-24: qty 1

## 2016-10-24 MED ORDER — BSS IO SOLN
INTRAOCULAR | Status: DC | PRN
  Administered 2016-10-24: 500 mL

## 2016-10-24 MED ORDER — MIDAZOLAM HCL 2 MG/2ML IJ SOLN
INTRAMUSCULAR | Status: AC
  Filled 2016-10-24: qty 2

## 2016-10-24 MED ORDER — LIDOCAINE HCL (PF) 1 % IJ SOLN
INTRAOCULAR | Status: DC | PRN
  Administered 2016-10-24: .9 mL via OPHTHALMIC

## 2016-10-24 MED ORDER — MIDAZOLAM HCL 2 MG/2ML IJ SOLN
1.0000 mg | INTRAMUSCULAR | Status: AC
  Administered 2016-10-24: 2 mg via INTRAVENOUS

## 2016-10-24 MED ORDER — CYCLOPENTOLATE-PHENYLEPHRINE 0.2-1 % OP SOLN
1.0000 [drp] | OPHTHALMIC | Status: AC
  Administered 2016-10-24 (×3): 1 [drp] via OPHTHALMIC

## 2016-10-24 MED ORDER — SODIUM HYALURONATE 23 MG/ML IO SOLN
INTRAOCULAR | Status: DC | PRN
  Administered 2016-10-24: 0.6 mL via INTRAOCULAR

## 2016-10-24 MED ORDER — NEOMYCIN-POLYMYXIN-DEXAMETH 3.5-10000-0.1 OP SUSP
OPHTHALMIC | Status: DC | PRN
  Administered 2016-10-24: 2 [drp] via OPHTHALMIC

## 2016-10-24 MED ORDER — PHENYLEPHRINE HCL 2.5 % OP SOLN
1.0000 [drp] | OPHTHALMIC | Status: AC
  Administered 2016-10-24 (×3): 1 [drp] via OPHTHALMIC

## 2016-10-24 MED ORDER — POVIDONE-IODINE 5 % OP SOLN
OPHTHALMIC | Status: DC | PRN
  Administered 2016-10-24: 1 via OPHTHALMIC

## 2016-10-24 SURGICAL SUPPLY — 18 items
CLOTH BEACON ORANGE TIMEOUT ST (SAFETY) ×3 IMPLANT
EYE SHIELD UNIVERSAL CLEAR (GAUZE/BANDAGES/DRESSINGS) ×3 IMPLANT
GLOVE BIOGEL PI IND STRL 6.5 (GLOVE) ×1 IMPLANT
GLOVE BIOGEL PI IND STRL 7.0 (GLOVE) ×1 IMPLANT
GLOVE BIOGEL PI IND STRL 7.5 (GLOVE) ×1 IMPLANT
GLOVE BIOGEL PI INDICATOR 6.5 (GLOVE) ×2
GLOVE BIOGEL PI INDICATOR 7.0 (GLOVE) ×2
GLOVE BIOGEL PI INDICATOR 7.5 (GLOVE) ×2
GLOVE EXAM NITRILE LRG STRL (GLOVE) IMPLANT
GLOVE EXAM NITRILE MD LF STRL (GLOVE) IMPLANT
NEEDLE HYPO 18GX1.5 BLUNT FILL (NEEDLE) ×3 IMPLANT
PAD ARMBOARD 7.5X6 YLW CONV (MISCELLANEOUS) ×3 IMPLANT
RING MALYGIN (MISCELLANEOUS) IMPLANT
SIGHTPATH CAT PROC W REG LENS (Ophthalmic Related) ×3 IMPLANT
SYR TB 1ML LL NO SAFETY (SYRINGE) ×3 IMPLANT
TAPE TRANSPARENT 1/2IN (GAUZE/BANDAGES/DRESSINGS) ×3 IMPLANT
VISCOELASTIC ADDITIONAL (OPHTHALMIC RELATED) IMPLANT
WATER STERILE IRR 250ML POUR (IV SOLUTION) ×3 IMPLANT

## 2016-10-24 NOTE — Anesthesia Preprocedure Evaluation (Signed)
Anesthesia Evaluation  Patient identified by MRN, date of birth, ID band Patient awake    Reviewed: Allergy & Precautions, NPO status , Patient's Chart, lab work & pertinent test results  Airway Mallampati: I  TM Distance: >3 FB Neck ROM: Full    Dental  (+) Edentulous Upper   Pulmonary Current Smoker,    breath sounds clear to auscultation       Cardiovascular hypertension, Pt. on medications  Rhythm:Regular Rate:Normal     Neuro/Psych PSYCHIATRIC DISORDERS Anxiety    GI/Hepatic negative GI ROS, Neg liver ROS,   Endo/Other    Renal/GU negative Renal ROS     Musculoskeletal   Abdominal   Peds  Hematology   Anesthesia Other Findings   Reproductive/Obstetrics                             Anesthesia Physical Anesthesia Plan  ASA: II  Anesthesia Plan: MAC   Post-op Pain Management:    Induction: Intravenous  Airway Management Planned: Nasal Cannula  Additional Equipment:   Intra-op Plan:   Post-operative Plan:   Informed Consent: I have reviewed the patients History and Physical, chart, labs and discussed the procedure including the risks, benefits and alternatives for the proposed anesthesia with the patient or authorized representative who has indicated his/her understanding and acceptance.     Plan Discussed with:   Anesthesia Plan Comments:         Anesthesia Quick Evaluation

## 2016-10-24 NOTE — Op Note (Addendum)
Date of procedure: 10/24/16  Pre-operative diagnosis: Visually significant cataract, Right Eye  Post-operative diagnosis: Visually significant cataract, Right Eye  Procedure: Removal of cataract via phacoemulsification and insertion of intra-ocular lens AMO PCB00  +23.0D into the capsular bag of the Right Eye  Attending surgeon: Gerda Diss. Zeferino Mounts, MD, MA  Anesthesia: MAC, Topical Akten  Complications: None  Estimated Blood Loss: <23m (minimal)  Specimens: None  Implants: As above  Indications:  Visually significant cataract, Right Eye  Procedure:  The patient was seen and identified in the pre-operative area. The operative eye was identified and dilated.  The operative eye was marked.  Topical anesthesia was administered to the operative eye.     The patient was then to the operative suite and placed in the supine position.  A timeout was performed confirming the patient, procedure to be performed, and all other relevant information.   The patient's face was prepped and draped in the usual fashion for intra-ocular surgery.  A lid speculum was placed into the operative eye and the surgical microscope moved into place and focused.  A superotemporal paracentesis was created using a 20 gauge paracentesis blade.  Shugarcaine was injected into the anterior chamber.  Viscoelastic was injected into the anterior chamber.  A temporal clear-corneal main wound incision was created using a 2.457mmicrokeratome.  A continuous curvilinear capsulorrhexis was initiated using an irrigating cystitome and completed using capsulorrhexis forceps.  Hydrodissection and hydrodeliniation were performed.  Viscoelastic was injected into the anterior chamber.  A phacoemulsification handpiece and a chopper as a second instrument were used to remove the nucleus and epinucleus. The irrigation/aspiration handpiece was used to remove any remaining cortical material.   The capsular bag was reinflated with viscoelastic,  checked, and found to be intact.  The intraocular lens was inserted into the capsular bag and dialed into place using a Kuglen hook.  The irrigation/aspiration handpiece was used to remove any remaining viscoelastic.  The clear corneal wound and paracentesis wounds were then hydrated and checked with Weck-Cels to be watertight.  The lid-speculum and drape was removed, and the patient's face was cleaned with a wet and dry 4x4.  Maxitrol was instilled in the eye before a clear shield was taped over the eye. The patient was taken to the post-operative care unit in good condition, having tolerated the procedure well.  Post-Op Instructions: [facility] and will receive all other orders and instructions.

## 2016-10-24 NOTE — Anesthesia Postprocedure Evaluation (Signed)
Anesthesia Post Note  Patient: Kelly Peters  Procedure(s) Performed: Procedure(s) (LRB): CATARACT EXTRACTION PHACO AND INTRAOCULAR LENS PLACEMENT RIGHT EYE CDE= 24.99 (Right)  Patient location during evaluation: Short Stay Anesthesia Type: MAC Level of consciousness: awake and alert, oriented and patient cooperative Pain management: pain level controlled Vital Signs Assessment: post-procedure vital signs reviewed and stable Respiratory status: spontaneous breathing Cardiovascular status: blood pressure returned to baseline and stable Postop Assessment: no headache, adequate PO intake and no backache Anesthetic complications: no     Last Vitals:  Vitals:   10/24/16 0715 10/24/16 0730  BP: 133/64 135/72  Resp: 19 (!) 32  Temp:      Last Pain:  Vitals:   10/24/16 0710  TempSrc: Oral                 Aracelly Tencza

## 2016-10-24 NOTE — H&P (Signed)
The H and P was reviewed and updated. The patient was examined.  No changes were found after exam.  The surgical eye was marked.  

## 2016-10-24 NOTE — Transfer of Care (Signed)
Immediate Anesthesia Transfer of Care Note  Patient: Kelly Peters  Procedure(s) Performed: Procedure(s) with comments: CATARACT EXTRACTION PHACO AND INTRAOCULAR LENS PLACEMENT RIGHT EYE CDE= 24.99 (Right) - right  Patient Location: Short Stay  Anesthesia Type:MAC  Level of Consciousness: awake, alert , oriented and patient cooperative  Airway & Oxygen Therapy: Patient Spontanous Breathing  Post-op Assessment: Report given to RN and Post -op Vital signs reviewed and stable  Post vital signs: Reviewed and stable  Last Vitals:  Vitals:   10/24/16 0715 10/24/16 0730  BP: 133/64 135/72  Resp: 19 (!) 32  Temp:      Last Pain:  Vitals:   10/24/16 0710  TempSrc: Oral      Patients Stated Pain Goal: 8 (94/80/16 [id number])  Complications: No apparent anesthesia complications

## 2016-10-24 NOTE — Discharge Instructions (Signed)
Cataract Surgery Cataract surgery is a procedure to remove a cataract from your eye. A cataract is cloudiness on the lens of your eye. The lens focuses light inside the eye. When a lens becomes cloudy, your vision is affected. Cataract surgery is a procedure to remove the cloudy lens. A substitute lens (intraocular lens or IOL) is usually inserted as a replacement for the cloudy lens. Tell a health care provider about:  Any allergies you have.  All medicines you are taking, including vitamins, herbs, eye drops, creams, and over-the-counter medicines.  Any problems you or family members have had with anesthetic medicines.  Any blood disorders you have.  Any surgeries you have had, especially eye surgeries that include refractive surgery, such as PRK and LASIK.  Any medical conditions you have.  Whether you are pregnant or may be pregnant. What are the risks? Generally, this is a safe procedure. However, problems may occur, including:  Infection.  Bleeding.  Glaucoma.  Retinal detachment.  Allergic reactions to medicines.  Damage to other structures or organs.  Inflammation of the eye.  Clouding of the part of your eye that holds an IOL in place (after-cataract), if an IOL was inserted. This is fairly common.  An IOL moving out of position, if an IOL was inserted. This is very rare.  Loss of vision. This is rare. What happens before the procedure?  Follow instructions from your health care provider about eating or drinking restrictions.  Ask your health care provider about:  Changing or stopping your regular medicines, including any eye drops you have been prescribed. This is especially important if you are taking diabetes medicines or blood thinners.  Taking medicines such as aspirin and ibuprofen. These medicines can thin your blood. Do not take these medicines before your procedure if your health care provider instructs you not to.  Do not put contact lenses in  either eye on the day of your surgery.  Plan for someone to drive you to and from the procedure.  If you will be going home right after the procedure, plan to have someone with you for 24 hours. What happens during the procedure?  An IV tube may be inserted into one of your veins.  You will be given one or more of the following:  A medicine to help you relax (sedative).  A medicine to numb the area (local anesthetic). This may be numbing eye drops or an injection that is given behind the eye.  A small cut (incision) will be made to the edge of the clear, dome-shaped surface that covers the front of the eye (cornea).  A small probe will be inserted into the eye. This device gives off ultrasound waves that soften and break up the cloudy center of the lens. This makes it easier for the cloudy lens to be removed by suction.  An IOL may be implanted.  Part of the capsule that surrounds the lens will be left in the eye to support the IOL.  Your surgeon may use stitches (sutures) to close the incision. The procedure may vary among health care providers and hospitals. What happens after the procedure?  Your blood pressure, heart rate, breathing rate, and blood oxygen level will be monitored often until the medicines you were given have worn off.  You may be given a protective shield to wear over your eyes.  Do not drive for 24 hours if you received a sedative. This information is not intended to replace advice given to you  by your health care provider. Make sure you discuss any questions you have with your health care provider. Document Released: 06/26/2011 Document Revised: 12/13/2015 Document Reviewed: 05/17/2015 Elsevier Interactive Patient Education  2017 [employer]. Please discharge patient when stable, will follow up today with Dr. Marisa Hua at the [facility] office at 10:10AM.  Leave shield in place until visit.  All paperwork with discharge instructions will be given at  the office.

## 2016-10-27 ENCOUNTER — Encounter (HOSPITAL_COMMUNITY): Payer: Self-pay | Admitting: Ophthalmology

## 2016-11-17 ENCOUNTER — Other Ambulatory Visit: Payer: Medicare Other

## 2016-11-17 DIAGNOSIS — I10 Essential (primary) hypertension: Secondary | ICD-10-CM

## 2016-11-18 LAB — LIPID PANEL
CHOL/HDL RATIO: 3.7 ratio (ref 0.0–4.4)
Cholesterol, Total: 222 mg/dL — ABNORMAL HIGH (ref 100–199)
HDL: 60 mg/dL (ref >39)
LDL CALC: 139 mg/dL — AB (ref 0–99)
TRIGLYCERIDES: 114 mg/dL (ref 0–149)
VLDL CHOLESTEROL CAL: 23 mg/dL (ref 5–40)

## 2016-11-18 LAB — CMP14+EGFR
ALT: 7 IU/L (ref 0–32)
AST: 15 IU/L (ref 0–40)
Albumin/Globulin Ratio: 2 (ref 1.2–2.2)
Albumin: 4.9 g/dL — ABNORMAL HIGH (ref 3.5–4.7)
Alkaline Phosphatase: 103 IU/L (ref 39–117)
BUN/Creatinine Ratio: 14 (ref 12–28)
BUN: 16 mg/dL (ref 8–27)
Bilirubin Total: 0.6 mg/dL (ref 0.0–1.2)
CALCIUM: 10 mg/dL (ref 8.7–10.3)
CO2: 26 mmol/L (ref 18–29)
CREATININE: 1.11 mg/dL — AB (ref 0.57–1.00)
Chloride: 95 mmol/L — ABNORMAL LOW (ref 96–106)
GFR calc Af Amer: 53 mL/min/{1.73_m2} — ABNORMAL LOW (ref >59)
GFR, EST NON AFRICAN AMERICAN: 46 mL/min/{1.73_m2} — AB (ref >59)
GLOBULIN, TOTAL: 2.5 g/dL (ref 1.5–4.5)
GLUCOSE: 129 mg/dL — AB (ref 65–99)
Potassium: 4.9 mmol/L (ref 3.5–5.2)
SODIUM: 137 mmol/L (ref 134–144)
Total Protein: 7.4 g/dL (ref 6.0–8.5)

## 2016-11-21 ENCOUNTER — Other Ambulatory Visit: Payer: Self-pay | Admitting: Physician Assistant

## 2016-11-21 DIAGNOSIS — F419 Anxiety disorder, unspecified: Secondary | ICD-10-CM

## 2016-11-25 ENCOUNTER — Ambulatory Visit (INDEPENDENT_AMBULATORY_CARE_PROVIDER_SITE_OTHER): Payer: Medicare Other | Admitting: Physician Assistant

## 2016-11-25 ENCOUNTER — Encounter: Payer: Self-pay | Admitting: Physician Assistant

## 2016-11-25 VITALS — BP 116/65 | HR 89 | Temp 96.6°F | Ht 61.0 in | Wt 107.0 lb

## 2016-11-25 DIAGNOSIS — F419 Anxiety disorder, unspecified: Secondary | ICD-10-CM | POA: Diagnosis not present

## 2016-11-25 DIAGNOSIS — R300 Dysuria: Secondary | ICD-10-CM

## 2016-11-25 DIAGNOSIS — I10 Essential (primary) hypertension: Secondary | ICD-10-CM

## 2016-11-25 DIAGNOSIS — K5900 Constipation, unspecified: Secondary | ICD-10-CM

## 2016-11-25 DIAGNOSIS — Z1211 Encounter for screening for malignant neoplasm of colon: Secondary | ICD-10-CM | POA: Diagnosis not present

## 2016-11-25 LAB — MICROSCOPIC EXAMINATION
Bacteria, UA: NONE SEEN
Epithelial Cells (non renal): >10 /hpf — AB (ref 0–10)
RBC MICROSCOPIC, UA: NONE SEEN /HPF (ref 0–?)

## 2016-11-25 LAB — URINALYSIS, COMPLETE
Bilirubin, UA: NEGATIVE
GLUCOSE, UA: NEGATIVE
Ketones, UA: NEGATIVE
Leukocytes, UA: NEGATIVE
Nitrite, UA: NEGATIVE
Protein, UA: NEGATIVE
RBC, UA: NEGATIVE
Specific Gravity, UA: <1.005 — ABNORMAL LOW (ref 1.005–1.030)
Urobilinogen, Ur: 0.2 mg/dL (ref 0.2–1.0)
pH, UA: 6 (ref 5.0–7.5)

## 2016-11-25 MED ORDER — ALPRAZOLAM 0.5 MG PO TABS: 0.5000 mg | ORAL_TABLET | Freq: Two times a day (BID) | ORAL | 0 refills | Status: DC | PRN

## 2016-11-25 NOTE — Patient Instructions (Signed)
Stool for Occult Blood Test Why am I having this test? Stool for occult blood, or fecal occult blood test (FOBT), is a test that is used to screen for gastrointestinal (GI) bleeding, which may be an indicator of colon cancer. This test can also detect small amounts of blood in your stool (feces) from other causes, such as ulcers or hemorrhoids. This test is usually done as part of an annual routine examination after age 81. What kind of sample is taken? A sample of your stool is required for this test. Your health care provider may collect the sample with a swab of the rectum. Or, you may be instructed to collect the sample in a container at home. If you are instructed to collect the sample, your health care provider will provide you with the instructions and the supplies that you will need to do that. How do I collect samples at home? A stool sample may need to be collected at home. When collecting a sample at home, make sure that you:  Use the sterile containers and other supplies that were given to you from the lab.  Do not mix urine, toilet paper, or water with your sample.  Label all slides and containers with your name and the date when you collected the sample. Your health care provider or lab staff will give you one or more test "cards." You will collect a separate sample from three different stools, usually on different days that follow each other. Follow these steps for each sample: 1. Collect a stool sample into a clean container. 2. With an applicator stick, apply a thin smear of stool sample onto each filter paper square or window that is on the card. 3. Allow the filter paper to dry. After it is dry, the sample will be stable. Usually, you will return all of the samples to your health care provider or lab at the same time. How do I prepare for this test?  Do not eat any red meat within three days before testing.  Follow your health care provider's instructions about eating and  drinking prior to the test. Your health care provider may instruct you to avoid other foods or substances.  Ask your health care provider about taking or not taking your medicines prior to the test. You may be instructed to avoid certain medicines that are known to interfere with this test. How are the results reported? Your test results will be reported as either positive or negative. It is your responsibility to obtain your test results. Ask the lab or department performing the test when and how you will get your results. What do the results mean? A negative test result means that there is no occult blood within the stool. A negative result is normal. A positive test result may mean that there is blood in the stool. Causes of blood in the stool include:  GI tumors.  Certain GI diseases.  GI trauma or recent surgery.  Hemorrhoids. Talk with your health care provider to discuss your results, treatment options, and if necessary, the need for more tests. Talk with your health care provider if you have any questions about your results. Talk with your health care provider to discuss your results, treatment options, and if necessary, the need for more tests. Talk with your health care provider if you have any questions about your results. This information is not intended to replace advice given to you by your health care provider. Make sure you discuss any questions you  have with your health care provider. Document Released: 08/01/2004 Document Revised: 03/05/2016 Document Reviewed: 12/02/2013 Elsevier Interactive Patient Education  2017 [employer].

## 2016-11-27 NOTE — Progress Notes (Signed)
BP 116/65   Pulse 89   Temp (!) 96.6 F (35.9 C) (Oral)   Ht 5\' 1"  (1.549 m)   Wt 107 lb (48.5 kg)   BMI 20.22 kg/m    Subjective:    Patient ID: Kelly Peters, female    DOB: 1930-05-11, 83 y.o.   MRN: [id number]  HPI: Kelly Peters is a 83 y.o. female presenting on 11/25/2016 for Medication Refill and Follow-up  This patient comes in for periodic recheck on medications and conditions including hypertension, anxiety, constipation, dysuria. She has been stable overall and no complaints otherwise.  All medications are reviewed today. There are no reports of any problems with the medications. All of the medical conditions are reviewed and updated.  Lab work is reviewed and will be ordered as medically necessary. There are no new problems reported with today's visit.   Relevant past medical, surgical, family and social history reviewed and updated as indicated. Allergies and medications reviewed and updated.  Past Medical History:  Diagnosis Date  . Hypertension     Past Surgical History:  Procedure Laterality Date  . ABDOMINAL HYSTERECTOMY    . CATARACT EXTRACTION W/PHACO Left 10/03/2016   Procedure: CATARACT EXTRACTION PHACO AND INTRAOCULAR LENS PLACEMENT LEFT EYE CDE - 25.42;  Surgeon: Baruch Goldmann, MD;  Location: [facility];  Service: Ophthalmology;  Laterality: Left;  left  . CATARACT EXTRACTION W/PHACO Right 10/24/2016   Procedure: CATARACT EXTRACTION PHACO AND INTRAOCULAR LENS PLACEMENT RIGHT EYE CDE= 24.99;  Surgeon: Baruch Goldmann, MD;  Location: [facility];  Service: Ophthalmology;  Laterality: Right;  right    Review of Systems  Constitutional: Negative.   HENT: Negative.   Eyes: Negative.   Respiratory: Negative.   Gastrointestinal: Negative.   Genitourinary: Negative.     Allergies as of 11/25/2016      Reactions   Penicillins Rash   Has patient had a PCN reaction causing immediate rash, facial/tongue/throat swelling, SOB or lightheadedness with hypotension:No Has  patient had a PCN reaction causing severe rash involving mucus membranes or skin necrosis:Yes Has patient had a PCN reaction that required hospitalization:No Has patient had a PCN reaction occurring within the last 10 years:No If all of the above answers are "NO", then may proceed with Cephalosporin use.      Medication List       Accurate as of 11/25/16 11:59 PM. Always use your most recent med list.          acetaminophen 500 MG tablet Commonly known as:  TYLENOL Take 500 mg by mouth as needed for moderate pain or headache.   ALPRAZolam 0.5 MG tablet Commonly known as:  XANAX Take 1 tablet (0.5 mg total) by mouth 2 (two) times daily as needed for anxiety.   amLODipine 5 MG tablet Commonly known as:  NORVASC Take 1 tablet (5 mg total) by mouth daily.   aspirin EC 81 MG tablet Take 81 mg by mouth daily.   polyethylene glycol packet Commonly known as:  MIRALAX / GLYCOLAX Take 8.5 g by mouth at bedtime.   senna 8.6 MG Tabs tablet Commonly known as:  SENOKOT Take 1 tablet (8.6 mg total) by mouth daily.          Objective:    BP 116/65   Pulse 89   Temp (!) 96.6 F (35.9 C) (Oral)   Ht 5\' 1"  (1.549 m)   Wt 107 lb (48.5 kg)   BMI 20.22 kg/m   Allergies  Allergen Reactions  .  Penicillins Rash    Has patient had a PCN reaction causing immediate rash, facial/tongue/throat swelling, SOB or lightheadedness with hypotension:No Has patient had a PCN reaction causing severe rash involving mucus membranes or skin necrosis:Yes Has patient had a PCN reaction that required hospitalization:No Has patient had a PCN reaction occurring within the last 10 years:No If all of the above answers are "NO", then may proceed with Cephalosporin use.     Physical Exam  Constitutional: She is oriented to person, place, and time. She appears well-developed and well-nourished.  HENT:  Head: Normocephalic and atraumatic.  Eyes: Conjunctivae and EOM are normal. Pupils are equal, round,  and reactive to light.  Cardiovascular: Normal rate, regular rhythm, normal heart sounds and intact distal pulses.   Pulmonary/Chest: Effort normal and breath sounds normal.  Abdominal: Soft. Bowel sounds are normal.  Neurological: She is alert and oriented to person, place, and time. She has normal reflexes.  Skin: Skin is warm and dry. No rash noted.  Psychiatric: She has a normal mood and affect. Her behavior is normal. Judgment and thought content normal.  Nursing note and vitals reviewed.   Results for orders placed or performed in visit on 11/25/16  Microscopic Examination  Result Value Ref Range   WBC, UA 0-5 0 - 5 /hpf   RBC, UA None seen 0 - 2 /hpf   Epithelial Cells (non renal) >10 (A) 0 - 10 /hpf   Bacteria, UA None seen None seen/Few  Urinalysis, Complete  Result Value Ref Range   Specific Gravity, UA <1.005 (L) 1.005 - 1.030   pH, UA 6.0 5.0 - 7.5   Color, UA Yellow Yellow   Appearance Ur Clear Clear   Leukocytes, UA Negative Negative   Protein, UA Negative Negative/Trace   Glucose, UA Negative Negative   Ketones, UA Negative Negative   RBC, UA Negative Negative   Bilirubin, UA Negative Negative   Urobilinogen, Ur 0.2 0.2 - 1.0 mg/dL   Nitrite, UA Negative Negative   Microscopic Examination See below:       Assessment & Plan:   1. Dysuria - Urinalysis, Complete - Microscopic Examination  2. Essential hypertension  3. Constipation, unspecified constipation type  4. Anxiety - ALPRAZolam (XANAX) 0.5 MG tablet; Take 1 tablet (0.5 mg total) by mouth 2 (two) times daily as needed for anxiety.  Dispense: 180 tablet; Refill: 0  5. Screening for colon cancer - Fecal occult blood, imunochemical; Future   Current Outpatient Prescriptions:  .  acetaminophen (TYLENOL) 500 MG tablet, Take 500 mg by mouth as needed for moderate pain or headache., Disp: , Rfl:  .  ALPRAZolam (XANAX) 0.5 MG tablet, Take 1 tablet (0.5 mg total) by mouth 2 (two) times daily as needed  for anxiety., Disp: 180 tablet, Rfl: 0 .  amLODipine (NORVASC) 5 MG tablet, Take 1 tablet (5 mg total) by mouth daily., Disp: 30 tablet, Rfl: 11 .  aspirin EC 81 MG tablet, Take 81 mg by mouth daily., Disp: , Rfl:  .  polyethylene glycol (MIRALAX / GLYCOLAX) packet, Take 8.5 g by mouth at bedtime., Disp: , Rfl:  .  senna (SENOKOT) 8.6 MG TABS tablet, Take 1 tablet (8.6 mg total) by mouth daily. (Patient taking differently: Take 1 tablet by mouth at bedtime. ), Disp: 120 each, Rfl: 0  Continue all other maintenance medications as listed above.  Follow up plan: Return in about 6 months (around 05/28/2017).  Educational handout given for occult blood  Terald Sleeper PA-C  [facility] [address]  [place], [place] [address] [phone]   11/27/2016, 9:37 PM

## 2017-03-05 ENCOUNTER — Other Ambulatory Visit: Payer: Self-pay | Admitting: Physician Assistant

## 2017-03-05 DIAGNOSIS — F419 Anxiety disorder, unspecified: Secondary | ICD-10-CM

## 2017-03-05 DIAGNOSIS — I10 Essential (primary) hypertension: Secondary | ICD-10-CM

## 2017-03-06 NOTE — Telephone Encounter (Signed)
Last seen 11/25/16  Kelly Peters  If approved route to nurse to call into [facility]

## 2017-03-09 NOTE — Telephone Encounter (Signed)
Phoned in.

## 2017-06-05 ENCOUNTER — Other Ambulatory Visit: Payer: Self-pay | Admitting: Physician Assistant

## 2017-06-05 DIAGNOSIS — I10 Essential (primary) hypertension: Secondary | ICD-10-CM

## 2017-06-08 ENCOUNTER — Ambulatory Visit (INDEPENDENT_AMBULATORY_CARE_PROVIDER_SITE_OTHER): Payer: Medicare Other | Admitting: Physician Assistant

## 2017-06-08 ENCOUNTER — Encounter: Payer: Self-pay | Admitting: Physician Assistant

## 2017-06-08 VITALS — BP 136/83 | HR 101 | Temp 96.8°F | Ht 61.0 in | Wt 107.6 lb

## 2017-06-08 DIAGNOSIS — F419 Anxiety disorder, unspecified: Secondary | ICD-10-CM

## 2017-06-08 DIAGNOSIS — R300 Dysuria: Secondary | ICD-10-CM

## 2017-06-08 DIAGNOSIS — K649 Unspecified hemorrhoids: Secondary | ICD-10-CM | POA: Diagnosis not present

## 2017-06-08 DIAGNOSIS — N3000 Acute cystitis without hematuria: Secondary | ICD-10-CM | POA: Diagnosis not present

## 2017-06-08 LAB — MICROSCOPIC EXAMINATION
RBC, UA: NONE SEEN /hpf (ref 0–?)
RENAL EPITHEL UA: NONE SEEN /HPF

## 2017-06-08 LAB — URINALYSIS, COMPLETE
Bilirubin, UA: NEGATIVE
GLUCOSE, UA: NEGATIVE
Ketones, UA: NEGATIVE
NITRITE UA: NEGATIVE
PH UA: 7 (ref 5.0–7.5)
Protein, UA: NEGATIVE
RBC, UA: NEGATIVE
Specific Gravity, UA: <1.005 — ABNORMAL LOW (ref 1.005–1.030)
Urobilinogen, Ur: 0.2 mg/dL (ref 0.2–1.0)

## 2017-06-08 MED ORDER — SULFAMETHOXAZOLE-TRIMETHOPRIM 800-160 MG PO TABS: 1.0000 | ORAL_TABLET | Freq: Two times a day (BID) | ORAL | 0 refills | Status: DC

## 2017-06-08 MED ORDER — CITALOPRAM HYDROBROMIDE 20 MG PO TABS: 20.0000 mg | ORAL_TABLET | Freq: Every day | ORAL | 3 refills | Status: DC

## 2017-06-08 MED ORDER — HYDROCORTISONE 2.5 % RE CREA: 1.0000 "application " | TOPICAL_CREAM | Freq: Two times a day (BID) | RECTAL | 2 refills | Status: DC

## 2017-06-08 NOTE — Patient Instructions (Signed)
In a few days you may receive a survey in the mail or online from [employer] regarding your visit with us today. Please take a moment to fill this out. Your feedback is very important to our whole office. It can help us better understand your needs as well as improve your experience and satisfaction. Thank you for taking your time to complete it. We care about you.  Lashe Oliveira, PA-C  

## 2017-06-08 NOTE — Progress Notes (Signed)
BP 136/83   Pulse (!) 101   Temp (!) 96.8 F (36 C) (Oral)   Ht 5\' 1"  (1.549 m)   Wt 107 lb 9.6 oz (48.8 kg)   BMI 20.33 kg/m    Subjective:    Patient ID: Kelly Peters, female    DOB: Oct 20, 1929, 83 y.o.   MRN: [id number]  HPI: Kelly Peters is a 83 y.o. female presenting on 06/08/2017 for Dysuria and Urinary Frequency  Patient comes in for symptoms of a urinary tract infection.  She has had 3 this year.  He has not had any fever or chills.  It is causing urinary frequency and burning in the perineal area.  She also has had increased constipation and therefore her hemorrhoid is flared up.  She is only been using over-the-counter Preparation H without any benefit.  She does not have constant hemorrhoids only when she gets constipated.  She has been trying to use Senokot regularly and MiraLAX as needed.  I have encouraged her to take the Senokot daily as her stool softener and the MiraLAX to or more times per week as maintenance.  Patient is also having increased anxiety.  She had stopped her citalopram because she thought she did not need it anymore.  She would like to go back on it.  She has had more down episodes and feeling very anxious a lot of the time. There was also questions about the need for colonoscopy.  She is 83 years old and I will determine what the recommendations are.  Relevant past medical, surgical, family and social history reviewed and updated as indicated. Allergies and medications reviewed and updated.  Past Medical History:  Diagnosis Date  . Hypertension     Past Surgical History:  Procedure Laterality Date  . ABDOMINAL HYSTERECTOMY    . CATARACT EXTRACTION PHACO AND INTRAOCULAR LENS PLACEMENT LEFT EYE CDE - 25.42 Left 10/03/2016   Performed by Baruch Goldmann, MD at [facility]  . CATARACT EXTRACTION PHACO AND INTRAOCULAR LENS PLACEMENT RIGHT EYE CDE= 24.99 Right 10/24/2016   Performed by Baruch Goldmann, MD at [facility]    Review of Systems  Constitutional:  Negative.   HENT: Negative.   Eyes: Negative.   Respiratory: Negative.   Gastrointestinal: Positive for blood in stool and rectal pain.  Genitourinary: Positive for difficulty urinating, dysuria and urgency. Negative for flank pain.    Allergies as of 06/08/2017      Reactions   Penicillins Rash   Has patient had a PCN reaction causing immediate rash, facial/tongue/throat swelling, SOB or lightheadedness with hypotension:No Has patient had a PCN reaction causing severe rash involving mucus membranes or skin necrosis:Yes Has patient had a PCN reaction that required hospitalization:No Has patient had a PCN reaction occurring within the last 10 years:No If all of the above answers are "NO", then may proceed with Cephalosporin use.      Medication List        Accurate as of 06/08/17 12:12 PM. Always use your most recent med list.          acetaminophen 500 MG tablet Commonly known as:  TYLENOL Take 500 mg by mouth as needed for moderate pain or headache.   ALPRAZolam 0.5 MG tablet Commonly known as:  XANAX TAKE ONE TABLET TWICE DAILY AS NEEDED   amLODipine 5 MG tablet Commonly known as:  NORVASC TAKE ONE (1) TABLET EACH DAY   aspirin EC 81 MG tablet Take 81 mg by mouth daily.  citalopram 20 MG tablet Commonly known as:  CELEXA Take 1 tablet (20 mg total) daily by mouth.   hydrocortisone 2.5 % rectal cream Commonly known as:  ANUSOL-HC Place 1 application 2 (two) times daily rectally.   meloxicam 7.5 MG tablet Commonly known as:  MOBIC TAKE ONE (1) TABLET EACH DAY   polyethylene glycol packet Commonly known as:  MIRALAX / GLYCOLAX Take 8.5 g by mouth at bedtime.   senna 8.6 MG Tabs tablet Commonly known as:  SENOKOT Take 1 tablet (8.6 mg total) by mouth daily.   sulfamethoxazole-trimethoprim 800-160 MG tablet Commonly known as:  BACTRIM DS Take 1 tablet 2 (two) times daily by mouth.          Objective:    BP 136/83   Pulse (!) 101   Temp (!) 96.8  F (36 C) (Oral)   Ht 5\' 1"  (1.549 m)   Wt 107 lb 9.6 oz (48.8 kg)   BMI 20.33 kg/m   Allergies  Allergen Reactions  . Penicillins Rash    Has patient had a PCN reaction causing immediate rash, facial/tongue/throat swelling, SOB or lightheadedness with hypotension:No Has patient had a PCN reaction causing severe rash involving mucus membranes or skin necrosis:Yes Has patient had a PCN reaction that required hospitalization:No Has patient had a PCN reaction occurring within the last 10 years:No If all of the above answers are "NO", then may proceed with Cephalosporin use.     Physical Exam  Constitutional: She is oriented to person, place, and time. She appears well-developed and well-nourished.  HENT:  Head: Normocephalic and atraumatic.  Eyes: Conjunctivae are normal. Pupils are equal, round, and reactive to light.  Cardiovascular: Normal rate, regular rhythm, normal heart sounds and intact distal pulses.  Pulmonary/Chest: Effort normal and breath sounds normal.  Abdominal: Soft. Bowel sounds are normal. She exhibits no distension and no mass. There is tenderness in the suprapubic area. There is no rebound, no guarding and no CVA tenderness.  Neurological: She is alert and oriented to person, place, and time. She has normal reflexes.  Skin: Skin is warm and dry. No rash noted.  Psychiatric: She has a normal mood and affect. Her behavior is normal. Judgment and thought content normal.  Nursing note and vitals reviewed.       Assessment & Plan:   1. Dysuria - Urine Culture - Urinalysis, Complete  2. Acute cystitis without hematuria - sulfamethoxazole-trimethoprim (BACTRIM DS) 800-160 MG tablet; Take 1 tablet 2 (two) times daily by mouth.  Dispense: 20 tablet; Refill: 0  3. Anxiety - citalopram (CELEXA) 20 MG tablet; Take 1 tablet (20 mg total) daily by mouth.  Dispense: 30 tablet; Refill: 3  4. Hemorrhoids, unspecified hemorrhoid type - hydrocortisone (ANUSOL-HC) 2.5 %  rectal cream; Place 1 application 2 (two) times daily rectally.  Dispense: 30 g; Refill: 2    Current Outpatient Medications:  .  acetaminophen (TYLENOL) 500 MG tablet, Take 500 mg by mouth as needed for moderate pain or headache., Disp: , Rfl:  .  ALPRAZolam (XANAX) 0.5 MG tablet, TAKE ONE TABLET TWICE DAILY AS NEEDED, Disp: 180 tablet, Rfl: 0 .  amLODipine (NORVASC) 5 MG tablet, TAKE ONE (1) TABLET EACH DAY, Disp: 90 tablet, Rfl: 0 .  aspirin EC 81 MG tablet, Take 81 mg by mouth daily., Disp: , Rfl:  .  citalopram (CELEXA) 20 MG tablet, Take 1 tablet (20 mg total) daily by mouth., Disp: 30 tablet, Rfl: 3 .  hydrocortisone (ANUSOL-HC) 2.5 % rectal  cream, Place 1 application 2 (two) times daily rectally., Disp: 30 g, Rfl: 2 .  meloxicam (MOBIC) 7.5 MG tablet, TAKE ONE (1) TABLET EACH DAY, Disp: 90 tablet, Rfl: 0 .  polyethylene glycol (MIRALAX / GLYCOLAX) packet, Take 8.5 g by mouth at bedtime., Disp: , Rfl:  .  senna (SENOKOT) 8.6 MG TABS tablet, Take 1 tablet (8.6 mg total) by mouth daily. (Patient taking differently: Take 1 tablet by mouth at bedtime. ), Disp: 120 each, Rfl: 0 .  sulfamethoxazole-trimethoprim (BACTRIM DS) 800-160 MG tablet, Take 1 tablet 2 (two) times daily by mouth., Disp: 20 tablet, Rfl: 0 Continue all other maintenance medications as listed above.  Follow up plan: Return in about 4 weeks (around 07/06/2017) for recheck.  Educational handout given for [place] PA-C [facility] [address]  [place], [place] [address] [phone]   06/08/2017, 12:12 PM

## 2017-06-10 LAB — URINE CULTURE

## 2017-06-15 ENCOUNTER — Other Ambulatory Visit: Payer: Self-pay | Admitting: *Deleted

## 2017-06-15 DIAGNOSIS — R3911 Hesitancy of micturition: Secondary | ICD-10-CM

## 2017-06-15 DIAGNOSIS — R300 Dysuria: Secondary | ICD-10-CM

## 2017-06-15 NOTE — Progress Notes (Signed)
Recommend urology appointment.  Please place referral for [facility] for dysuria and urinary hesitancy.

## 2017-06-16 ENCOUNTER — Telehealth: Payer: Self-pay | Admitting: Physician Assistant

## 2017-06-16 NOTE — Telephone Encounter (Signed)
Pt has no DPR on file; Called daughter and informed her of this and that she needs to bring her in and had a form signed. Daughter was given the number to [facility]

## 2017-06-23 ENCOUNTER — Encounter (HOSPITAL_COMMUNITY): Payer: Self-pay | Admitting: Emergency Medicine

## 2017-06-23 ENCOUNTER — Emergency Department (HOSPITAL_COMMUNITY)
Admission: EM | Admit: 2017-06-23 | Discharge: 2017-06-23 | Disposition: A | Discharge status: Home / Self Care | Payer: Medicare Other | Attending: Emergency Medicine | Admitting: Emergency Medicine

## 2017-06-23 ENCOUNTER — Emergency Department (HOSPITAL_COMMUNITY): Payer: Medicare Other

## 2017-06-23 ENCOUNTER — Other Ambulatory Visit: Payer: Self-pay

## 2017-06-23 DIAGNOSIS — Z79899 Other long term (current) drug therapy: Secondary | ICD-10-CM | POA: Insufficient documentation

## 2017-06-23 DIAGNOSIS — I10 Essential (primary) hypertension: Secondary | ICD-10-CM | POA: Diagnosis not present

## 2017-06-23 DIAGNOSIS — Z7982 Long term (current) use of aspirin: Secondary | ICD-10-CM | POA: Diagnosis not present

## 2017-06-23 DIAGNOSIS — F17200 Nicotine dependence, unspecified, uncomplicated: Secondary | ICD-10-CM | POA: Diagnosis not present

## 2017-06-23 DIAGNOSIS — R39198 Other difficulties with micturition: Secondary | ICD-10-CM | POA: Diagnosis not present

## 2017-06-23 DIAGNOSIS — N814 Uterovaginal prolapse, unspecified: Secondary | ICD-10-CM | POA: Insufficient documentation

## 2017-06-23 DIAGNOSIS — R102 Pelvic and perineal pain: Secondary | ICD-10-CM

## 2017-06-23 DIAGNOSIS — K5900 Constipation, unspecified: Secondary | ICD-10-CM | POA: Insufficient documentation

## 2017-06-23 LAB — URINALYSIS, ROUTINE W REFLEX MICROSCOPIC
BILIRUBIN URINE: NEGATIVE
GLUCOSE, UA: NEGATIVE mg/dL
HGB URINE DIPSTICK: NEGATIVE
KETONES UR: NEGATIVE mg/dL
Leukocytes, UA: NEGATIVE
Nitrite: NEGATIVE
PROTEIN: NEGATIVE mg/dL
Specific Gravity, Urine: 1.003 — ABNORMAL LOW (ref 1.005–1.030)
pH: 6 (ref 5.0–8.0)

## 2017-06-23 LAB — CBC WITH DIFFERENTIAL/PLATELET
BASOS ABS: 0 10*3/uL (ref 0.0–0.1)
BASOS PCT: 1 %
EOS PCT: 0 %
Eosinophils Absolute: 0 10*3/uL (ref 0.0–0.7)
HCT: 39 % (ref 36.0–46.0)
Hemoglobin: 12.3 g/dL (ref 12.0–15.0)
Lymphocytes Relative: 15 %
Lymphs Abs: 1 10*3/uL (ref 0.7–4.0)
MCH: 30.6 pg (ref 26.0–34.0)
MCHC: 31.5 g/dL (ref 30.0–36.0)
MCV: 97 fL (ref 78.0–100.0)
MONO ABS: 0.5 10*3/uL (ref 0.1–1.0)
Monocytes Relative: 7 %
Neutro Abs: 5.3 10*3/uL (ref 1.7–7.7)
Neutrophils Relative %: 77 %
PLATELETS: 234 10*3/uL (ref 150–400)
RBC: 4.02 MIL/uL (ref 3.87–5.11)
RDW: 13.2 % (ref 11.5–15.5)
WBC: 6.8 10*3/uL (ref 4.0–10.5)

## 2017-06-23 LAB — COMPREHENSIVE METABOLIC PANEL
ALBUMIN: 4.3 g/dL (ref 3.5–5.0)
ALT: 11 U/L — ABNORMAL LOW (ref 14–54)
ANION GAP: 9 (ref 5–15)
AST: 17 U/L (ref 15–41)
Alkaline Phosphatase: 76 U/L (ref 38–126)
BUN: 15 mg/dL (ref 6–20)
CHLORIDE: 98 mmol/L — AB (ref 101–111)
CO2: 28 mmol/L (ref 22–32)
Calcium: 9.6 mg/dL (ref 8.9–10.3)
Creatinine, Ser: 0.97 mg/dL (ref 0.44–1.00)
GFR calc Af Amer: >60 mL/min (ref >60)
GFR, EST NON AFRICAN AMERICAN: 52 mL/min — AB (ref >60)
Glucose, Bld: 109 mg/dL — ABNORMAL HIGH (ref 65–99)
POTASSIUM: 4.5 mmol/L (ref 3.5–5.1)
Sodium: 135 mmol/L (ref 135–145)
Total Bilirubin: 0.8 mg/dL (ref 0.3–1.2)
Total Protein: 7.2 g/dL (ref 6.5–8.1)

## 2017-06-23 MED ORDER — IOPAMIDOL (ISOVUE-300) INJECTION 61%
100.0000 mL | Freq: Once | INTRAVENOUS | Status: AC | PRN
  Administered 2017-06-23: 100 mL via INTRAVENOUS

## 2017-06-23 NOTE — ED Triage Notes (Signed)
Pt c/o intermittent lower abdominal pain, back pain, and pain in rectum/vagina for months. Reports pain has gotten worse. States she was treated for UTI in November. Pt also reports constipation and difficulty urinating.

## 2017-06-23 NOTE — ED Provider Notes (Signed)
[facility] EMERGENCY DEPARTMENT Provider Note   CSN: [id number] Arrival date & time: 06/23/17  0814     History   Chief Complaint Chief Complaint  Patient presents with  . Abdominal Pain    HPI Kelly Peters is a 83 y.o. female. Chief complaint is pelvic pain  HPI: 83 year old female who by review of her chart is complained of pelvic and urinary symptoms for several months. Seen by primary care and treated for possible UTI with antibiotics about 1 week ago. However culture was negative/multiple organisms.  She complains of difficulty obtaining her bladder at times. She complains of difficulty having bowel movements at times. She is a senna. She was told you senna daily by her primary care physician and use MiraLAX when necessary. She occasionally uses Anusol suppositories or Preparation H as this relieves rectal pain. No vaginal bleeding or discharge. No blood in her urine. No blood in her stools or known external hemorrhoids.  Per family report has history of diverticuli. His never had frank diverticulitis although she states that she eats nuts or seeds that "flares me up".  Past Medical History:  Diagnosis Date  . Hypertension     Patient Active Problem List   Diagnosis Date Noted  . Essential hypertension 09/26/2016  . Anxiety 09/26/2016  . Constipation 09/26/2016  . Acute cystitis without hematuria 09/26/2016    Past Surgical History:  Procedure Laterality Date  . ABDOMINAL HYSTERECTOMY    . CATARACT EXTRACTION W/PHACO Left 10/03/2016   Procedure: CATARACT EXTRACTION PHACO AND INTRAOCULAR LENS PLACEMENT LEFT EYE CDE - 25.42;  Surgeon: Baruch Goldmann, MD;  Location: [facility];  Service: Ophthalmology;  Laterality: Left;  left  . CATARACT EXTRACTION W/PHACO Right 10/24/2016   Procedure: CATARACT EXTRACTION PHACO AND INTRAOCULAR LENS PLACEMENT RIGHT EYE CDE= 24.99;  Surgeon: Baruch Goldmann, MD;  Location: [facility];  Service: Ophthalmology;  Laterality: Right;  right    OB  History    No data available       Home Medications    Prior to Admission medications   Medication Sig Start Date End Date Taking? Authorizing Provider  acetaminophen (TYLENOL) 500 MG tablet Take 500 mg by mouth as needed for moderate pain or headache.   Yes [provider]  ALPRAZolam Duanne Moron) 0.5 MG tablet TAKE ONE TABLET TWICE DAILY AS NEEDED 03/09/17  Yes Terald Sleeper, PA-C  amLODipine (NORVASC) 5 MG tablet TAKE ONE (1) TABLET EACH DAY 06/05/17  Yes Terald Sleeper, PA-C  aspirin EC 81 MG tablet Take 81 mg by mouth daily.   Yes [provider]  citalopram (CELEXA) 20 MG tablet Take 1 tablet (20 mg total) daily by mouth. 06/08/17  Yes Terald Sleeper, PA-C  hydrocortisone (ANUSOL-HC) 2.5 % rectal cream Place 1 application 2 (two) times daily rectally. 06/08/17  Yes Terald Sleeper, PA-C  meloxicam (MOBIC) 7.5 MG tablet Take 7.5 mg by mouth daily as needed for pain.   Yes [provider]  polyethylene glycol (MIRALAX / GLYCOLAX) packet Take 17 g by mouth daily as needed for moderate constipation.    Yes [provider]  senna (SENOKOT) 8.6 MG TABS tablet Take 1 tablet (8.6 mg total) by mouth daily. Patient taking differently: Take 1 tablet by mouth at bedtime.  09/26/16  Yes Terald Sleeper, PA-C  sulfamethoxazole-trimethoprim (BACTRIM DS) 800-160 MG tablet Take 1 tablet 2 (two) times daily by mouth. Patient not taking: Reported on 06/23/2017 06/08/17   Terald Sleeper, PA-C  Family History No family history on file.  Social History Social History   Tobacco Use  . Smoking status: Current Every Day Smoker    Packs/day: 1.00    Years: 50.00    Pack years: 50.00  . Smokeless tobacco: Never Used  Substance Use Topics  . Alcohol use: No  . Drug use: No     Allergies   Penicillins   Review of Systems Review of Systems  Constitutional: Negative for appetite change, chills, diaphoresis, fatigue and fever.  HENT: Negative for mouth sores, sore  throat and trouble swallowing.   Eyes: Negative for visual disturbance.  Respiratory: Negative for cough, chest tightness, shortness of breath and wheezing.   Cardiovascular: Negative for chest pain.  Gastrointestinal: Positive for abdominal pain and constipation. Negative for abdominal distention, diarrhea, nausea and vomiting.  Endocrine: Negative for polydipsia, polyphagia and polyuria.  Genitourinary: Positive for difficulty urinating and pelvic pain. Negative for dysuria, frequency and hematuria.  Musculoskeletal: Negative for gait problem.  Skin: Negative for color change, pallor and rash.  Neurological: Negative for dizziness, syncope, light-headedness and headaches.  Hematological: Does not bruise/bleed easily.  Psychiatric/Behavioral: Negative for behavioral problems and confusion. The patient is nervous/anxious.      Physical Exam Updated Vital Signs BP (!) 142/78   Pulse 78   Temp 97.7 F (36.5 C) (Oral)   Resp 17   Ht 5\' 1"  (1.549 m)   Wt 48.5 kg (107 lb)   SpO2 96%   BMI 20.22 kg/m   Physical Exam  Constitutional: She is oriented to person, place, and time. She appears well-developed and well-nourished. No distress.  HENT:  Head: Normocephalic.  Eyes: Conjunctivae are normal. Pupils are equal, round, and reactive to light. No scleral icterus.  Neck: Normal range of motion. Neck supple. No thyromegaly present.  Cardiovascular: Normal rate and regular rhythm. Exam reveals no gallop and no friction rub.  No murmur heard. Pulmonary/Chest: Effort normal and breath sounds normal. No respiratory distress. She has no wheezes. She has no rales.  Abdominal: Soft. Bowel sounds are normal. She exhibits no distension. There is no tenderness. There is no rebound.  She indicates an area just left of midline or suprapubic abdomen with deep palpation "hurts a little" no peritoneal irritation.  Genitourinary:  Genitourinary Comments: Pelvic exam performed. Patient is surgically  absent uterus. She has cystocele with Valsalva  Musculoskeletal: Normal range of motion.  Neurological: She is alert and oriented to person, place, and time.  Skin: Skin is warm and dry. No rash noted.  Psychiatric: She has a normal mood and affect. Her behavior is normal.     ED Treatments / Results  Labs (all labs ordered are listed, but only abnormal results are displayed) Labs Reviewed  URINALYSIS, ROUTINE W REFLEX MICROSCOPIC - Abnormal; Notable for the following components:      Result Value   Color, Urine STRAW (*)    Specific Gravity, Urine 1.003 (*)    All other components within normal limits  COMPREHENSIVE METABOLIC PANEL - Abnormal; Notable for the following components:   Chloride 98 (*)    Glucose, Bld 109 (*)    ALT 11 (*)    GFR calc non Af Amer 52 (*)    All other components within normal limits  CBC WITH DIFFERENTIAL/PLATELET    EKG  EKG Interpretation None       Radiology Ct Abdomen Pelvis W Contrast  Result Date: 06/23/2017 CLINICAL DATA:  Abdominal pain with diverticulitis suspected. EXAM: CT  ABDOMEN AND PELVIS WITH CONTRAST TECHNIQUE: Multidetector CT imaging of the abdomen and pelvis was performed using the standard protocol following bolus administration of intravenous contrast. CONTRAST:  151mL ISOVUE-300 IOPAMIDOL (ISOVUE-300) INJECTION 61% COMPARISON:  None. FINDINGS: Lower chest: Atherosclerotic calcifications seen in the RCA. No acute finding. Hepatobiliary: No focal liver abnormality.No evidence of biliary obstruction or stone. Pancreas: No acute or aggressive finding.  Generalized atrophy. Spleen: Unremarkable. Adrenals/Urinary Tract: Negative adrenals. No hydronephrosis or stone. Mild symmetric bilateral renal atrophy. Unremarkable bladder. Stomach/Bowel: No obstruction. No appendicitis. Advanced diverticulosis of the colon, especially at the sigmoid. No active inflammation is seen. Vascular/Lymphatic: Advanced atherosclerosis. Separate origins of  the common hepatic and splenic arteries that are both atheromatous and narrowed. Atherosclerosis of the patent SMA with likely moderate narrowing proximally. Bulky plaque at the aortic bifurcation and proximal iliacs with bilateral proximal iliac stenosis. No mass or adenopathy. Reproductive:  Hysterectomy.  Negative adnexae. Other: No ascites or pneumoperitoneum. Small fatty left inguinal hernia. Musculoskeletal: No acute abnormalities. Advanced lumbar facet arthropathy with slight anterolisthesis at L5-S1. Advanced lower thoracic degenerative disc narrowing with endplate sclerosis. IMPRESSION: 1. No acute finding. 2. Extensive colonic diverticulosis without visible diverticulitis. 3. 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 4. Small fatty left inguinal hernia. Electronically Signed   By: Monte Fantasia M.D.   On: 06/23/2017 11:45    Procedures Procedures (including critical care time)  Medications Ordered in ED Medications  iopamidol (ISOVUE-300) 61 % injection 100 mL (100 mLs Intravenous Contrast Given 06/23/17 1122)     Initial Impression / Assessment and Plan / ED Course  I have reviewed the triage vital signs and the nursing notes.  Pertinent labs & imaging results that were available during my care of the patient were reviewed by me and considered in my medical decision making (see chart for details).    Urine does not appear infected. Patient and family state that she is "consumed "by the fact that she prayed she has cancer. As discussed with her that there is no indication for emergency on CT scanning today. Try to relieve her anxiety about her constipation. Take senna twice per day and daily MiraLAX. Anusol to relieve symptoms. If she has difficulty urinating sitting position with her cystocele asked her to use the shower or bathtub to urinate standing or laying. GYN follow-up.  Final Clinical Impressions(s) / ED Diagnoses   Final diagnoses:  Pelvic  pain in female  Cystocele with prolapse    ED Discharge Orders    None       Tanna Furry, MD 06/23/17 1306

## 2017-06-23 NOTE — ED Notes (Signed)
In with edp to perfrom pelvic

## 2017-06-23 NOTE — Discharge Instructions (Signed)
Senna am and pm MiraLax daily if no BM for 24 hours. Urinate in shower or bath if having difficulty emptying in sitting position.

## 2017-06-23 NOTE — ED Notes (Signed)
Pt returned from ct

## 2017-07-06 ENCOUNTER — Encounter: Payer: Self-pay | Admitting: Physician Assistant

## 2017-07-06 ENCOUNTER — Ambulatory Visit (INDEPENDENT_AMBULATORY_CARE_PROVIDER_SITE_OTHER): Payer: Medicare Other | Admitting: Physician Assistant

## 2017-07-06 VITALS — BP 117/63 | HR 81 | Temp 97.0°F | Ht 61.0 in | Wt 106.2 lb

## 2017-07-06 DIAGNOSIS — F419 Anxiety disorder, unspecified: Secondary | ICD-10-CM | POA: Diagnosis not present

## 2017-07-06 DIAGNOSIS — F339 Major depressive disorder, recurrent, unspecified: Secondary | ICD-10-CM

## 2017-07-06 DIAGNOSIS — K5900 Constipation, unspecified: Secondary | ICD-10-CM

## 2017-07-06 DIAGNOSIS — N8111 Cystocele, midline: Secondary | ICD-10-CM | POA: Insufficient documentation

## 2017-07-06 NOTE — Patient Instructions (Addendum)
Magnesium 400 mg Cranberry   In a few days you may receive a survey in the mail or online from [employer] regarding your visit with [place] today. Please take a moment to fill this out. Your feedback is very important to our whole office. It can help [place] better understand your needs as well as improve your experience and satisfaction. Thank you for taking your time to complete it. We care about you.  Particia Nearing, PA-C

## 2017-07-06 NOTE — Progress Notes (Signed)
BP 117/63   Pulse 81   Temp (!) 97 F (36.1 C) (Oral)   Ht 5\' 1"  (1.549 m)   Wt 106 lb 3.2 oz (48.2 kg)   BMI 20.07 kg/m    Subjective:    Patient ID: Kelly Peters, female    DOB: [birth year], 83 y.o.   MRN: [id number]  HPI: Kelly Peters is a 83 y.o. female presenting on 07/06/2017 for Follow-up (4 week)  This patient comes in for periodic recheck on medications and conditions including her anxiety and depression.  She took one Celexa and had some itchiness on her back and she thought it was due to this.  She did not take another pill.  Her depression is not been greatly improved.  Her relative is here with her today.  Try the Celexa one more time.  She also is still having problems with her cystocele.  An appointment had been made in [place] but due to transportation issues she would like to go to the [place] office.  We also continue to work on her constipation issues.  She has a plan of what to use went she is having difficulties.  She is using something every day at this point.  She takes the Senokot daily and MiraLAX every 2-3 days.  She has had a great improvement in her bowels..   All medications are reviewed today. There are no reports of any problems with the medications. All of the medical conditions are reviewed and updated.  Lab work is reviewed and will be ordered as medically necessary. There are no new problems reported with today's visit.   Relevant past medical, surgical, family and social history reviewed and updated as indicated. Allergies and medications reviewed and updated.  Past Medical History:  Diagnosis Date  . Hypertension     Past Surgical History:  Procedure Laterality Date  . ABDOMINAL HYSTERECTOMY    . CATARACT EXTRACTION W/PHACO Left 10/03/2016   Procedure: CATARACT EXTRACTION PHACO AND INTRAOCULAR LENS PLACEMENT LEFT EYE CDE - 25.42;  Surgeon: Baruch Goldmann, MD;  Location: [facility];  Service: Ophthalmology;  Laterality: Left;  left  .  CATARACT EXTRACTION W/PHACO Right 10/24/2016   Procedure: CATARACT EXTRACTION PHACO AND INTRAOCULAR LENS PLACEMENT RIGHT EYE CDE= 24.99;  Surgeon: Baruch Goldmann, MD;  Location: [facility];  Service: Ophthalmology;  Laterality: Right;  right    Review of Systems  Constitutional: Negative.  Negative for activity change, fatigue and fever.  HENT: Negative.   Eyes: Negative.   Respiratory: Negative.  Negative for cough.   Cardiovascular: Negative.  Negative for chest pain.  Gastrointestinal: Positive for constipation. Negative for abdominal pain.  Endocrine: Negative.   Genitourinary: Positive for difficulty urinating, dysuria and vaginal pain.  Musculoskeletal: Negative.   Skin: Negative.   Neurological: Negative.   Psychiatric/Behavioral: Positive for dysphoric mood. Negative for sleep disturbance and suicidal ideas. The patient is nervous/anxious.     Allergies as of 07/06/2017      Reactions   Penicillins Rash   Has patient had a PCN reaction causing immediate rash, facial/tongue/throat swelling, SOB or lightheadedness with hypotension:No Has patient had a PCN reaction causing severe rash involving mucus membranes or skin necrosis:Yes Has patient had a PCN reaction that required hospitalization:No Has patient had a PCN reaction occurring within the last 10 years:No If all of the above answers are "NO", then may proceed with Cephalosporin use.      Medication List        Accurate as  of 07/06/17  1:28 PM. Always use your most recent med list.          acetaminophen 500 MG tablet Commonly known as:  TYLENOL Take 500 mg by mouth as needed for moderate pain or headache.   ALPRAZolam 0.5 MG tablet Commonly known as:  XANAX TAKE ONE TABLET TWICE DAILY AS NEEDED   amLODipine 5 MG tablet Commonly known as:  NORVASC TAKE ONE (1) TABLET EACH DAY   aspirin EC 81 MG tablet Take 81 mg by mouth daily.   citalopram 20 MG tablet Commonly known as:  CELEXA Take 1 tablet (20 mg total)  daily by mouth.   hydrocortisone 2.5 % rectal cream Commonly known as:  ANUSOL-HC Place 1 application 2 (two) times daily rectally.   meloxicam 7.5 MG tablet Commonly known as:  MOBIC Take 7.5 mg by mouth daily as needed for pain.   polyethylene glycol packet Commonly known as:  MIRALAX / GLYCOLAX Take 17 g by mouth daily as needed for moderate constipation.   senna 8.6 MG Tabs tablet Commonly known as:  SENOKOT Take 1 tablet (8.6 mg total) by mouth daily.          Objective:    BP 117/63   Pulse 81   Temp (!) 97 F (36.1 C) (Oral)   Ht 5\' 1"  (1.549 m)   Wt 106 lb 3.2 oz (48.2 kg)   BMI 20.07 kg/m   Allergies  Allergen Reactions  . Penicillins Rash    Has patient had a PCN reaction causing immediate rash, facial/tongue/throat swelling, SOB or lightheadedness with hypotension:No Has patient had a PCN reaction causing severe rash involving mucus membranes or skin necrosis:Yes Has patient had a PCN reaction that required hospitalization:No Has patient had a PCN reaction occurring within the last 10 years:No If all of the above answers are "NO", then may proceed with Cephalosporin use.     Physical Exam  Constitutional: She is oriented to person, place, and time. She appears well-developed and well-nourished.  HENT:  Head: Normocephalic and atraumatic.  Right Ear: Tympanic membrane, external ear and ear canal normal.  Left Ear: Tympanic membrane, external ear and ear canal normal.  Nose: Nose normal. No rhinorrhea.  Mouth/Throat: Oropharynx is clear and moist and mucous membranes are normal. No oropharyngeal exudate or posterior oropharyngeal erythema.  Eyes: Conjunctivae and EOM are normal. Pupils are equal, round, and reactive to light.  Neck: Normal range of motion. Neck supple.  Cardiovascular: Normal rate, regular rhythm, normal heart sounds and intact distal pulses.  Pulmonary/Chest: Effort normal and breath sounds normal.  Abdominal: Soft. Bowel sounds are  normal.  Neurological: She is alert and oriented to person, place, and time. She has normal reflexes.  Skin: Skin is warm and dry. No rash noted.  Psychiatric: She has a normal mood and affect. Her behavior is normal. Judgment and thought content normal.  Nursing note and vitals reviewed.   Results for orders placed or performed during the hospital encounter of 06/23/17  Urinalysis, Routine w reflex microscopic  Result Value Ref Range   Color, Urine STRAW (A) YELLOW   APPearance CLEAR CLEAR   Specific Gravity, Urine 1.003 (L) 1.005 - 1.030   pH 6.0 5.0 - 8.0   Glucose, UA NEGATIVE NEGATIVE mg/dL   Hgb urine dipstick NEGATIVE NEGATIVE   Bilirubin Urine NEGATIVE NEGATIVE   Ketones, ur NEGATIVE NEGATIVE mg/dL   Protein, ur NEGATIVE NEGATIVE mg/dL   Nitrite NEGATIVE NEGATIVE   Leukocytes, UA NEGATIVE NEGATIVE  CBC with Differential/Platelet  Result Value Ref Range   WBC 6.8 4.0 - 10.5 K/uL   RBC 4.02 3.87 - 5.11 MIL/uL   Hemoglobin 12.3 12.0 - 15.0 g/dL   HCT 39.0 36.0 - 46.0 %   MCV 97.0 78.0 - 100.0 fL   MCH 30.6 26.0 - 34.0 pg   MCHC 31.5 30.0 - 36.0 g/dL   RDW 13.2 11.5 - 15.5 %   Platelets 234 150 - 400 K/uL   Neutrophils Relative % 77 %   Neutro Abs 5.3 1.7 - 7.7 K/uL   Lymphocytes Relative 15 %   Lymphs Abs 1.0 0.7 - 4.0 K/uL   Monocytes Relative 7 %   Monocytes Absolute 0.5 0.1 - 1.0 K/uL   Eosinophils Relative 0 %   Eosinophils Absolute 0.0 0.0 - 0.7 K/uL   Basophils Relative 1 %   Basophils Absolute 0.0 0.0 - 0.1 K/uL  Comprehensive metabolic panel  Result Value Ref Range   Sodium 135 135 - 145 mmol/L   Potassium 4.5 3.5 - 5.1 mmol/L   Chloride 98 (L) 101 - 111 mmol/L   CO2 28 22 - 32 mmol/L   Glucose, Bld 109 (H) 65 - 99 mg/dL   BUN 15 6 - 20 mg/dL   Creatinine, Ser 0.97 0.44 - 1.00 mg/dL   Calcium 9.6 8.9 - 10.3 mg/dL   Total Protein 7.2 6.5 - 8.1 g/dL   Albumin 4.3 3.5 - 5.0 g/dL   AST 17 15 - 41 U/L   ALT 11 (L) 14 - 54 U/L   Alkaline Phosphatase 76  38 - 126 U/L   Total Bilirubin 0.8 0.3 - 1.2 mg/dL   GFR calc non Af Amer 52 (L) >60 mL/min   GFR calc Af Amer >60 >60 mL/min   Anion gap 9 5 - 15      Assessment & Plan:   1. Anxiety  2. Depression, recurrent ([place])  3. Constipation, unspecified constipation type  4. Cystocele, midline - Ambulatory referral to Urology Pelvic performed at ED visit, cystocele observed   Current Outpatient Medications:  .  acetaminophen (TYLENOL) 500 MG tablet, Take 500 mg by mouth as needed for moderate pain or headache., Disp: , Rfl:  .  ALPRAZolam (XANAX) 0.5 MG tablet, TAKE ONE TABLET TWICE DAILY AS NEEDED, Disp: 180 tablet, Rfl: 0 .  amLODipine (NORVASC) 5 MG tablet, TAKE ONE (1) TABLET EACH DAY, Disp: 90 tablet, Rfl: 0 .  aspirin EC 81 MG tablet, Take 81 mg by mouth daily., Disp: , Rfl:  .  citalopram (CELEXA) 20 MG tablet, Take 1 tablet (20 mg total) daily by mouth., Disp: 30 tablet, Rfl: 3 .  hydrocortisone (ANUSOL-HC) 2.5 % rectal cream, Place 1 application 2 (two) times daily rectally., Disp: 30 g, Rfl: 2 .  meloxicam (MOBIC) 7.5 MG tablet, Take 7.5 mg by mouth daily as needed for pain., Disp: , Rfl:  .  polyethylene glycol (MIRALAX / GLYCOLAX) packet, Take 17 g by mouth daily as needed for moderate constipation. , Disp: , Rfl:  .  senna (SENOKOT) 8.6 MG TABS tablet, Take 1 tablet (8.6 mg total) by mouth daily. (Patient taking differently: Take 1 tablet by mouth at bedtime. ), Disp: 120 each, Rfl: 0 Continue all other maintenance medications as listed above.  Follow up plan: Return in about 3 months (around 10/04/2017) for recheck.  Educational handout given for [place] PA-C [facility] [address]  [place], [place] [address]  [phone]   07/06/2017, 1:28 PM

## 2017-07-22 ENCOUNTER — Other Ambulatory Visit: Payer: Self-pay | Admitting: Physician Assistant

## 2017-07-22 DIAGNOSIS — F419 Anxiety disorder, unspecified: Secondary | ICD-10-CM

## 2017-09-04 ENCOUNTER — Ambulatory Visit: Payer: Medicare Other | Admitting: Urology

## 2017-09-04 DIAGNOSIS — N816 Rectocele: Secondary | ICD-10-CM

## 2017-09-04 DIAGNOSIS — N3582 Other urethral stricture, female: Secondary | ICD-10-CM | POA: Diagnosis not present

## 2017-09-04 DIAGNOSIS — N952 Postmenopausal atrophic vaginitis: Secondary | ICD-10-CM | POA: Diagnosis not present

## 2017-09-05 ENCOUNTER — Other Ambulatory Visit: Payer: Self-pay | Admitting: Physician Assistant

## 2017-09-05 DIAGNOSIS — I10 Essential (primary) hypertension: Secondary | ICD-10-CM

## 2017-11-05 ENCOUNTER — Other Ambulatory Visit: Payer: Self-pay | Admitting: Physician Assistant

## 2017-11-05 DIAGNOSIS — F419 Anxiety disorder, unspecified: Secondary | ICD-10-CM

## 2017-12-09 ENCOUNTER — Other Ambulatory Visit: Payer: Self-pay | Admitting: Physician Assistant

## 2017-12-09 DIAGNOSIS — I10 Essential (primary) hypertension: Secondary | ICD-10-CM

## 2018-02-11 ENCOUNTER — Ambulatory Visit (INDEPENDENT_AMBULATORY_CARE_PROVIDER_SITE_OTHER): Payer: Medicare Other | Admitting: Physician Assistant

## 2018-02-11 ENCOUNTER — Encounter: Payer: Self-pay | Admitting: Physician Assistant

## 2018-02-11 VITALS — BP 137/75 | HR 104 | Temp 97.7°F | Ht 61.0 in | Wt 104.4 lb

## 2018-02-11 DIAGNOSIS — R5382 Chronic fatigue, unspecified: Secondary | ICD-10-CM | POA: Diagnosis not present

## 2018-02-11 DIAGNOSIS — R300 Dysuria: Secondary | ICD-10-CM

## 2018-02-11 DIAGNOSIS — R634 Abnormal weight loss: Secondary | ICD-10-CM

## 2018-02-11 LAB — MICROSCOPIC EXAMINATION: BACTERIA UA: NONE SEEN

## 2018-02-11 LAB — URINALYSIS, COMPLETE
Bilirubin, UA: NEGATIVE
Glucose, UA: NEGATIVE
KETONES UA: NEGATIVE
Nitrite, UA: NEGATIVE
PROTEIN UA: NEGATIVE
Urobilinogen, Ur: 0.2 mg/dL (ref 0.2–1.0)
pH, UA: 6.5 (ref 5.0–7.5)

## 2018-02-11 NOTE — Patient Instructions (Signed)
In a few days you may receive a survey in the mail or online from [employer] regarding your visit with us today. Please take a moment to fill this out. Your feedback is very important to our whole office. It can help us better understand your needs as well as improve your experience and satisfaction. Thank you for taking your time to complete it. We care about you.  Cyndal Kasson, PA-C  

## 2018-02-11 NOTE — Progress Notes (Signed)
BP 137/75   Pulse (!) 104   Temp 97.7 F (36.5 C) (Oral)   Ht '5\' 1"'  (1.549 m)   Wt 104 lb 6.4 oz (47.4 kg)   BMI 19.73 kg/m    Subjective:    Patient ID: Kelly Peters, female    DOB: [birth year], 84 y.o.   MRN: [id number]  HPI: Kelly Peters is a 84 y.o. female presenting on 02/11/2018 for Urinary Tract Infection and Urinary Frequency  Few days ago the patient was experiencing some nausea through the night.  She did get up and vomit.  She went back to bed she did have a lot of chills.  She had a put extra blankets on.  For the next 2 days she had significant amount of sleeping even throughout the day.  She denies any other fever or chills.  She states that sometimes her urine is a little bit dark.  She would like to have a check for infection.  It has been as sometime since she has had a urinary tract infection.  She does not know of any time when she has had a major pneumonia.  Past Medical History:  Diagnosis Date  . Hypertension    Relevant past medical, surgical, family and social history reviewed and updated as indicated. Interim medical history since our last visit reviewed. Allergies and medications reviewed and updated. DATA REVIEWED: CHART IN EPIC  Family History reviewed for pertinent findings.  Review of Systems  Constitutional: Positive for fatigue.  HENT: Negative.   Eyes: Negative.   Respiratory: Negative.  Negative for cough, shortness of breath and wheezing.   Cardiovascular: Negative for chest pain, palpitations and leg swelling.  Gastrointestinal: Positive for nausea and vomiting.  Genitourinary: Positive for dysuria. Negative for difficulty urinating.    Allergies as of 02/11/2018      Reactions   Penicillins Rash   Has patient had a PCN reaction causing immediate rash, facial/tongue/throat swelling, SOB or lightheadedness with hypotension:No Has patient had a PCN reaction causing severe rash involving mucus membranes or skin necrosis:Yes Has patient  had a PCN reaction that required hospitalization:No Has patient had a PCN reaction occurring within the last 10 years:No If all of the above answers are "NO", then may proceed with Cephalosporin use.      Medication List        Accurate as of 02/11/18 12:36 PM. Always use your most recent med list.          acetaminophen 500 MG tablet Commonly known as:  TYLENOL Take 500 mg by mouth as needed for moderate pain or headache.   ALPRAZolam 0.5 MG tablet Commonly known as:  XANAX TAKE ONE TABLET TWICE DAILY AS NEEDED   amLODipine 5 MG tablet Commonly known as:  NORVASC TAKE ONE (1) TABLET EACH DAY   aspirin EC 81 MG tablet Take 81 mg by mouth daily.   citalopram 20 MG tablet Commonly known as:  CELEXA Take 1 tablet (20 mg total) daily by mouth.   hydrocortisone 2.5 % rectal cream Commonly known as:  ANUSOL-HC Place 1 application 2 (two) times daily rectally.   meloxicam 7.5 MG tablet Commonly known as:  MOBIC Take 7.5 mg by mouth daily as needed for pain.   polyethylene glycol packet Commonly known as:  MIRALAX / GLYCOLAX Take 17 g by mouth daily as needed for moderate constipation.   senna 8.6 MG Tabs tablet Commonly known as:  SENOKOT Take 1 tablet (8.6 mg total) by mouth  daily.          Objective:    BP 137/75   Pulse (!) 104   Temp 97.7 F (36.5 C) (Oral)   Ht '5\' 1"'  (1.549 m)   Wt 104 lb 6.4 oz (47.4 kg)   BMI 19.73 kg/m   Allergies  Allergen Reactions  . Penicillins Rash    Has patient had a PCN reaction causing immediate rash, facial/tongue/throat swelling, SOB or lightheadedness with hypotension:No Has patient had a PCN reaction causing severe rash involving mucus membranes or skin necrosis:Yes Has patient had a PCN reaction that required hospitalization:No Has patient had a PCN reaction occurring within the last 10 years:No If all of the above answers are "NO", then may proceed with Cephalosporin use.     Wt Readings from Last 3 Encounters:    02/11/18 104 lb 6.4 oz (47.4 kg)  07/06/17 106 lb 3.2 oz (48.2 kg)  06/23/17 107 lb (48.5 kg)    Physical Exam  Constitutional: She is oriented to person, place, and time. She appears well-developed and well-nourished.  HENT:  Head: Normocephalic and atraumatic.  Right Ear: Tympanic membrane, external ear and ear canal normal.  Left Ear: Tympanic membrane, external ear and ear canal normal.  Nose: Nose normal. No rhinorrhea.  Mouth/Throat: Oropharynx is clear and moist and mucous membranes are normal. No oropharyngeal exudate or posterior oropharyngeal erythema.  Eyes: Pupils are equal, round, and reactive to light. Conjunctivae and EOM are normal.  Neck: Normal range of motion. Neck supple.  Cardiovascular: Normal rate, regular rhythm, normal heart sounds and intact distal pulses.  Pulmonary/Chest: Effort normal and breath sounds normal.  Abdominal: Soft. Bowel sounds are normal.  Neurological: She is alert and oriented to person, place, and time. She has normal reflexes.  Skin: Skin is warm and dry. No rash noted.  Psychiatric: She has a normal mood and affect. Her behavior is normal. Judgment and thought content normal.    Results for orders placed or performed in visit on 02/11/18  Microscopic Examination  Result Value Ref Range   WBC, UA 0-5 0 - 5 /hpf   RBC, UA 0-2 0 - 2 /hpf   Epithelial Cells (non renal) 0-10 0 - 10 /hpf   Renal Epithel, UA 0-10 (A) None seen /hpf   Bacteria, UA None seen None seen/Few  Urinalysis, Complete  Result Value Ref Range   Specific Gravity, UA <1.005 (L) 1.005 - 1.030   pH, UA 6.5 5.0 - 7.5   Color, UA Yellow Yellow   Appearance Ur Clear Clear   Leukocytes, UA Trace (A) Negative   Protein, UA Negative Negative/Trace   Glucose, UA Negative Negative   Ketones, UA Negative Negative   RBC, UA Trace (A) Negative   Bilirubin, UA Negative Negative   Urobilinogen, Ur 0.2 0.2 - 1.0 mg/dL   Nitrite, UA Negative Negative   Microscopic Examination  See below:       Assessment & Plan:   1. Dysuria - Urine Culture - Urinalysis, Complete   2. Chronic fatigue - CBC with Differential/Platelet - CMP14+EGFR - TSH  3. Weight loss - CBC with Differential/Platelet - CMP14+EGFR - TSH   Continue all other maintenance medications as listed above.  Follow up plan: Return if symptoms worsen or fail to improve.  Educational handout given for [place] PA-C [facility] [address]  [place], [place] [address] [phone]   02/11/2018, 12:36 PM

## 2018-02-12 LAB — CMP14+EGFR
ALT: 7 IU/L (ref 0–32)
AST: 12 IU/L (ref 0–40)
Albumin/Globulin Ratio: 1.8 (ref 1.2–2.2)
Albumin: 4.4 g/dL (ref 3.5–4.7)
Alkaline Phosphatase: 94 IU/L (ref 39–117)
BUN/Creatinine Ratio: 15 (ref 12–28)
BUN: 17 mg/dL (ref 8–27)
Bilirubin Total: 0.5 mg/dL (ref 0.0–1.2)
CALCIUM: 9.5 mg/dL (ref 8.7–10.3)
CO2: 24 mmol/L (ref 20–29)
CREATININE: 1.17 mg/dL — AB (ref 0.57–1.00)
Chloride: 98 mmol/L (ref 96–106)
GFR calc Af Amer: 49 mL/min/{1.73_m2} — ABNORMAL LOW (ref >59)
GFR, EST NON AFRICAN AMERICAN: 42 mL/min/{1.73_m2} — AB (ref >59)
GLOBULIN, TOTAL: 2.5 g/dL (ref 1.5–4.5)
Glucose: 113 mg/dL — ABNORMAL HIGH (ref 65–99)
Potassium: 4.6 mmol/L (ref 3.5–5.2)
Sodium: 140 mmol/L (ref 134–144)
TOTAL PROTEIN: 6.9 g/dL (ref 6.0–8.5)

## 2018-02-12 LAB — CBC WITH DIFFERENTIAL/PLATELET
BASOS: 1 %
Basophils Absolute: 0.1 10*3/uL (ref 0.0–0.2)
EOS (ABSOLUTE): 0.1 10*3/uL (ref 0.0–0.4)
EOS: 2 %
HEMATOCRIT: 39.5 % (ref 34.0–46.6)
HEMOGLOBIN: 12.9 g/dL (ref 11.1–15.9)
IMMATURE GRANS (ABS): 0 10*3/uL (ref 0.0–0.1)
IMMATURE GRANULOCYTES: 0 %
LYMPHS ABS: 1.1 10*3/uL (ref 0.7–3.1)
LYMPHS: 16 %
MCH: 30.1 pg (ref 26.6–33.0)
MCHC: 32.7 g/dL (ref 31.5–35.7)
MCV: 92 fL (ref 79–97)
MONOCYTES: 8 %
MONOS ABS: 0.6 10*3/uL (ref 0.1–0.9)
NEUTROS ABS: 5.2 10*3/uL (ref 1.4–7.0)
NEUTROS PCT: 73 %
Platelets: 188 10*3/uL (ref 150–450)
RBC: 4.29 x10E6/uL (ref 3.77–5.28)
RDW: 13.8 % (ref 12.3–15.4)
WBC: 7.1 10*3/uL (ref 3.4–10.8)

## 2018-02-12 LAB — URINE CULTURE

## 2018-02-12 LAB — TSH: TSH: 3.53 u[IU]/mL (ref 0.450–4.500)

## 2018-02-23 ENCOUNTER — Other Ambulatory Visit: Payer: Self-pay | Admitting: Physician Assistant

## 2018-02-23 DIAGNOSIS — F419 Anxiety disorder, unspecified: Secondary | ICD-10-CM

## 2018-03-10 ENCOUNTER — Ambulatory Visit: Payer: Medicare Other | Admitting: *Deleted

## 2018-03-19 ENCOUNTER — Other Ambulatory Visit: Payer: Self-pay | Admitting: Physician Assistant

## 2018-03-19 DIAGNOSIS — I10 Essential (primary) hypertension: Secondary | ICD-10-CM

## 2018-04-08 ENCOUNTER — Encounter: Payer: Self-pay | Admitting: Family Medicine

## 2018-04-08 ENCOUNTER — Ambulatory Visit (INDEPENDENT_AMBULATORY_CARE_PROVIDER_SITE_OTHER): Payer: Medicare Other

## 2018-04-08 ENCOUNTER — Ambulatory Visit (INDEPENDENT_AMBULATORY_CARE_PROVIDER_SITE_OTHER): Payer: Medicare Other | Admitting: Family Medicine

## 2018-04-08 VITALS — BP 135/82 | HR 100 | Temp 97.9°F | Ht 61.0 in | Wt 103.0 lb

## 2018-04-08 DIAGNOSIS — R05 Cough: Secondary | ICD-10-CM | POA: Diagnosis not present

## 2018-04-08 DIAGNOSIS — J441 Chronic obstructive pulmonary disease with (acute) exacerbation: Secondary | ICD-10-CM

## 2018-04-08 DIAGNOSIS — R062 Wheezing: Secondary | ICD-10-CM

## 2018-04-08 DIAGNOSIS — R0902 Hypoxemia: Secondary | ICD-10-CM

## 2018-04-08 DIAGNOSIS — R059 Cough, unspecified: Secondary | ICD-10-CM

## 2018-04-08 DIAGNOSIS — R5382 Chronic fatigue, unspecified: Secondary | ICD-10-CM | POA: Diagnosis not present

## 2018-04-08 DIAGNOSIS — F419 Anxiety disorder, unspecified: Secondary | ICD-10-CM

## 2018-04-08 DIAGNOSIS — F339 Major depressive disorder, recurrent, unspecified: Secondary | ICD-10-CM

## 2018-04-08 MED ORDER — METHYLPREDNISOLONE SODIUM SUCC 125 MG IJ SOLR
125.0000 mg | Freq: Once | INTRAMUSCULAR | Status: AC
  Administered 2018-04-08: 125 mg via INTRAMUSCULAR

## 2018-04-08 MED ORDER — ALBUTEROL SULFATE 1.25 MG/3ML IN NEBU
1.2500 mg | INHALATION_SOLUTION | Freq: Once | RESPIRATORY_TRACT | Status: AC
  Administered 2018-04-08: 1.25 mg via RESPIRATORY_TRACT

## 2018-04-08 MED ORDER — LEVOFLOXACIN 500 MG PO TABS: 500.0000 mg | ORAL_TABLET | Freq: Every day | ORAL | 0 refills | Status: DC

## 2018-04-08 MED ORDER — ALBUTEROL SULFATE HFA 108 (90 BASE) MCG/ACT IN AERS: 2.0000 | INHALATION_SPRAY | Freq: Four times a day (QID) | RESPIRATORY_TRACT | 3 refills | Status: DC

## 2018-04-08 MED ORDER — CITALOPRAM HYDROBROMIDE 20 MG PO TABS: 20.0000 mg | ORAL_TABLET | Freq: Every day | ORAL | 3 refills | Status: DC

## 2018-04-08 MED ORDER — ALBUTEROL SULFATE HFA 108 (90 BASE) MCG/ACT IN AERS
2.0000 | INHALATION_SPRAY | RESPIRATORY_TRACT | 0 refills | Status: DC | PRN
Start: 2018-04-08 — End: 2018-04-08

## 2018-04-08 NOTE — Progress Notes (Signed)
Subjective:    Patient ID: Kelly Peters, female    DOB: [birth year], 83 y.o.   MRN: [id number]  Chief Complaint:  Chest congestion, cough, SOB, runny nose (x 3-4 days; has not tried anything OTC)   HPI: Kelly Peters is a 84 y.o. female presenting on 04/08/2018 for Chest congestion, cough, SOB, runny nose (x 3-4 days; has not tried anything OTC)  Pt presents today with complaints of ongoing fatigue for 3-4 weeks. She reports chest congestion, productive cough, shortness of breath, and lack of appetite and energy with the fatigue. She denies fever, reports chills at times. Pt states the shortness of breath is worse with exertion and being outside. She denies orthopnea, chest pain, palpitations, edema, or syncope. She states the cough has been productive over the past 3 days, greenish-yellow in color and thick consistency. She is a 50 pack years smoker. States she has cut back to 1/2 pack per day over the last few months. She denies a history of respiratory disease.  Pt also reports she has been feeling more anxious and depressed and that she has not taken her medications in a long time. Pt denies HI/SI, but reports she does feel depressed. She states she does not like taking medications and this is why she stopped her Celexa.    Relevant past medical, surgical, family and social history reviewed and updated as indicated. Interim medical history since our last visit reviewed. Allergies and medications reviewed and updated. DATA REVIEWED: CHART IN EPIC  Family History reviewed for pertinent findings.  Past Medical History:  Diagnosis Date  . Hypertension     Past Surgical History:  Procedure Laterality Date  . ABDOMINAL HYSTERECTOMY    . CATARACT EXTRACTION W/PHACO Left 10/03/2016   Procedure: CATARACT EXTRACTION PHACO AND INTRAOCULAR LENS PLACEMENT LEFT EYE CDE - 25.42;  Surgeon: Baruch Goldmann, MD;  Location: [facility];  Service: Ophthalmology;  Laterality: Left;  left  . CATARACT  EXTRACTION W/PHACO Right 10/24/2016   Procedure: CATARACT EXTRACTION PHACO AND INTRAOCULAR LENS PLACEMENT RIGHT EYE CDE= 24.99;  Surgeon: Baruch Goldmann, MD;  Location: [facility];  Service: Ophthalmology;  Laterality: Right;  right    Social History   Socioeconomic History  . Marital status: Widowed    Spouse name: Not on file  . Number of children: Not on file  . Years of education: Not on file  . Highest education level: Not on file  Occupational History  . Not on file  Social Needs  . Financial resource strain: Not on file  . Food insecurity:    Worry: Not on file    Inability: Not on file  . Transportation needs:    Medical: Not on file    Non-medical: Not on file  Tobacco Use  . Smoking status: Current Every Day Smoker    Packs/day: 1.00    Years: 50.00    Pack years: 50.00  . Smokeless tobacco: Never Used  Substance and Sexual Activity  . Alcohol use: No  . Drug use: No  . Sexual activity: Not on file  Lifestyle  . Physical activity:    Days per week: Not on file    Minutes per session: Not on file  . Stress: Not on file  Relationships  . Social connections:    Talks on phone: Not on file    Gets together: Not on file    Attends religious service: Not on file    Active member of club or organization:  Not on file    Attends meetings of clubs or organizations: Not on file    Relationship status: Not on file  . Intimate partner violence:    Fear of current or ex partner: Not on file    Emotionally abused: Not on file    Physically abused: Not on file    Forced sexual activity: Not on file  Other Topics Concern  . Not on file  Social History Narrative  . Not on file    Outpatient Encounter Medications as of 04/08/2018  Medication Sig  . acetaminophen (TYLENOL) 500 MG tablet Take 500 mg by mouth as needed for moderate pain or headache.  . ALPRAZolam (XANAX) 0.5 MG tablet TAKE ONE TABLET TWICE DAILY AS NEEDED  . amLODipine (NORVASC) 5 MG tablet TAKE ONE (1) TABLET  EACH DAY  . aspirin EC 81 MG tablet Take 81 mg by mouth daily.  . citalopram (CELEXA) 20 MG tablet Take 1 tablet (20 mg total) by mouth daily.  . hydrocortisone (ANUSOL-HC) 2.5 % rectal cream Place 1 application 2 (two) times daily rectally.  . meloxicam (MOBIC) 7.5 MG tablet Take 7.5 mg by mouth daily as needed for pain.  . polyethylene glycol (MIRALAX / GLYCOLAX) packet Take 17 g by mouth daily as needed for moderate constipation.   . senna (SENOKOT) 8.6 MG TABS tablet Take 1 tablet (8.6 mg total) by mouth daily. (Patient taking differently: Take 1 tablet by mouth at bedtime. )  . [DISCONTINUED] citalopram (CELEXA) 20 MG tablet Take 1 tablet (20 mg total) daily by mouth.  Marland Kitchen albuterol (PROVENTIL HFA;VENTOLIN HFA) 108 (90 Base) MCG/ACT inhaler Inhale 2 puffs into the lungs QID.  Marland Kitchen levofloxacin (LEVAQUIN) 500 MG tablet Take 1 tablet (500 mg total) by mouth daily.  . [DISCONTINUED] albuterol (PROVENTIL HFA;VENTOLIN HFA) 108 (90 Base) MCG/ACT inhaler Inhale 2 puffs into the lungs every 4 (four) hours as needed for wheezing or shortness of breath (cough).  . [EXPIRED] albuterol (ACCUNEB) nebulizer solution 1.25 mg   . [EXPIRED] methylPREDNISolone sodium succinate (SOLU-MEDROL) 125 mg/2 mL injection 125 mg    No facility-administered encounter medications on file as of 04/08/2018.     Allergies  Allergen Reactions  . Penicillins Rash    Has patient had a PCN reaction causing immediate rash, facial/tongue/throat swelling, SOB or lightheadedness with hypotension:No Has patient had a PCN reaction causing severe rash involving mucus membranes or skin necrosis:Yes Has patient had a PCN reaction that required hospitalization:No Has patient had a PCN reaction occurring within the last 10 years:No If all of the above answers are "NO", then may proceed with Cephalosporin use.     Review of Systems  Constitutional: Positive for activity change, appetite change, chills and fatigue. Negative for  diaphoresis, fever and unexpected weight change.  HENT: Positive for congestion. Negative for postnasal drip, rhinorrhea and sore throat.   Respiratory: Positive for cough, shortness of breath and wheezing. Negative for choking and chest tightness.   Cardiovascular: Negative for chest pain, palpitations and leg swelling.  Gastrointestinal: Positive for constipation. Negative for diarrhea, nausea and vomiting.  Endocrine: Positive for cold intolerance. Negative for heat intolerance.  Musculoskeletal: Negative for myalgias.  Skin: Negative for color change and pallor.  Neurological: Negative for dizziness, syncope, weakness and numbness.  Psychiatric/Behavioral: Negative for suicidal ideas.       Depression and anxiety  All other systems reviewed and are negative.       Objective:    BP 135/82   Pulse 100  Temp 97.9 F (36.6 C) (Oral)   Ht _0  (1.549 m)   Wt 103 lb (46.7 kg)   SpO2 94% Comment: post albuterol nebulizer  BMI 19.46 kg/m    Wt Readings from Last 3 Encounters:  04/08/18 103 lb (46.7 kg)  02/11/18 104 lb 6.4 oz (47.4 kg)  07/06/17 106 lb 3.2 oz (48.2 kg)    Physical Exam  Constitutional: She is oriented to person, place, and time. She appears well-developed and well-nourished. She appears distressed (mildly).  HENT:  Head: Normocephalic.  Right Ear: Hearing, tympanic membrane, external ear and ear canal normal. Tympanic membrane is not erythematous.  Left Ear: Hearing, tympanic membrane, external ear and ear canal normal. Tympanic membrane is not erythematous.  Mouth/Throat: Uvula is midline, oropharynx is clear and moist and mucous membranes are normal.  Eyes: Pupils are equal, round, and reactive to light. Conjunctivae are normal.  Cardiovascular: Normal rate, regular rhythm, S1 normal, S2 normal, normal heart sounds, intact distal pulses and normal pulses. Exam reveals no S3 and no S4.  Pulmonary/Chest: No accessory muscle usage or stridor. She is in  respiratory distress (mild). She has wheezes. She has rhonchi. She has no rales. She exhibits no tenderness.  Neurological: She is alert and oriented to person, place, and time.  Skin: Skin is warm and dry. Capillary refill takes less than 2 seconds. No cyanosis. Nails show clubbing.  Psychiatric: Her speech is normal and behavior is normal. Judgment and thought content normal. Her mood appears anxious. She expresses no homicidal and no suicidal ideation.  Nursing note and vitals reviewed.  Oxygen saturation via pulse oximetry 86% on room air upon arrival to treatment room. Pt mildly short of breath. Chest x-ray, solu-medrol injection, albuterol nebulizer completed in office. Post albuterol nebulizer: Pt no longer short of breath, oxygen saturation increased to 93%, and wheezing minimal.  Results for orders placed or performed in visit on 02/11/18  Urine Culture  Result Value Ref Range   Urine Culture, Routine Final report    Organism ID, Bacteria Comment   Microscopic Examination  Result Value Ref Range   WBC, UA 0-5 0 - 5 /hpf   RBC, UA 0-2 0 - 2 /hpf   Epithelial Cells (non renal) 0-10 0 - 10 /hpf   Renal Epithel, UA 0-10 (A) None seen /hpf   Bacteria, UA None seen None seen/Few  Urinalysis, Complete  Result Value Ref Range   Specific Gravity, UA <1.005 (L) 1.005 - 1.030   pH, UA 6.5 5.0 - 7.5   Color, UA Yellow Yellow   Appearance Ur Clear Clear   Leukocytes, UA Trace (A) Negative   Protein, UA Negative Negative/Trace   Glucose, UA Negative Negative   Ketones, UA Negative Negative   RBC, UA Trace (A) Negative   Bilirubin, UA Negative Negative   Urobilinogen, Ur 0.2 0.2 - 1.0 mg/dL   Nitrite, UA Negative Negative   Microscopic Examination See below:   CBC with Differential/Platelet  Result Value Ref Range   WBC 7.1 3.4 - 10.8 x10E3/uL   RBC 4.29 3.77 - 5.28 x10E6/uL   Hemoglobin 12.9 11.1 - 15.9 g/dL   Hematocrit 39.5 34.0 - 46.6 %   MCV 92 79 - 97 fL   MCH 30.1 26.6 -  33.0 pg   MCHC 32.7 31.5 - 35.7 g/dL   RDW 13.8 12.3 - 15.4 %   Platelets 188 150 - 450 x10E3/uL   Neutrophils 73 Not Estab. %   Lymphs 16 Not  Estab. %   Monocytes 8 Not Estab. %   Eos 2 Not Estab. %   Basos 1 Not Estab. %   Neutrophils Absolute 5.2 1.4 - 7.0 x10E3/uL   Lymphocytes Absolute 1.1 0.7 - 3.1 x10E3/uL   Monocytes Absolute 0.6 0.1 - 0.9 x10E3/uL   EOS (ABSOLUTE) 0.1 0.0 - 0.4 x10E3/uL   Basophils Absolute 0.1 0.0 - 0.2 x10E3/uL   Immature Granulocytes 0 Not Estab. %   Immature Grans (Abs) 0.0 0.0 - 0.1 x10E3/uL  CMP14+EGFR  Result Value Ref Range   Glucose 113 (H) 65 - 99 mg/dL   BUN 17 8 - 27 mg/dL   Creatinine, Ser 1.17 (H) 0.57 - 1.00 mg/dL   GFR calc non Af Amer 42 (L) >59 mL/min/1.73   GFR calc Af Amer 49 (L) >59 mL/min/1.73   BUN/Creatinine Ratio 15 12 - 28   Sodium 140 134 - 144 mmol/L   Potassium 4.6 3.5 - 5.2 mmol/L   Chloride 98 96 - 106 mmol/L   CO2 24 20 - 29 mmol/L   Calcium 9.5 8.7 - 10.3 mg/dL   Total Protein 6.9 6.0 - 8.5 g/dL   Albumin 4.4 3.5 - 4.7 g/dL   Globulin, Total 2.5 1.5 - 4.5 g/dL   Albumin/Globulin Ratio 1.8 1.2 - 2.2   Bilirubin Total 0.5 0.0 - 1.2 mg/dL   Alkaline Phosphatase 94 39 - 117 IU/L   AST 12 0 - 40 IU/L   ALT 7 0 - 32 IU/L  TSH  Result Value Ref Range   TSH 3.530 0.450 - 4.500 uIU/mL     X:Ray: Chest: revealed COPD/chronic changes, hyperinflation consistent with COPD. No active disease.   Assessment & Plan:   1. Cough Stop smoking. Increase water intake. - DG Chest 2 View; Future - albuterol (PROVENTIL HFA;VENTOLIN HFA) 108 (90 Base) MCG/ACT inhaler; Inhale 2 puffs into the lungs QID.  Dispense: 1 Inhaler; Refill: 3  2. Hypoxia Oxygen saturation via pulse oximetry 86% on room air upon arrival to treatment room. Oxygen saturation increased to 93% post nebulizer treatment.  - DG Chest 2 View; Future - albuterol (ACCUNEB) nebulizer solution 1.25 mg  3. Chronic fatigue Ensure daily. Sleep hygiene. Restart Celexa.     4. Depression, recurrent ([place]) Restart medications. Report any increased depression or thoughts of suicide.  - citalopram (CELEXA) 20 MG tablet; Take 1 tablet (20 mg total) by mouth daily.  Dispense: 30 tablet; Refill: 3  5. Diffuse wheezing Albuterol as prescribed.  - albuterol (ACCUNEB) nebulizer solution 1.25 mg - methylPREDNISolone sodium succinate (SOLU-MEDROL) 125 mg/2 mL injection 125 mg - albuterol (PROVENTIL HFA;VENTOLIN HFA) 108 (90 Base) MCG/ACT inhaler; Inhale 2 puffs into the lungs QID.  Dispense: 1 Inhaler; Refill: 3  6. COPD with acute exacerbation (HCC) No previous diagnosis of COPD. Chest xray, long history of smoking, and presentation consistent with COPD. Pt with exacerbation today. Pt will need to return to office when well to have spirometry testing for diagnosis and to be placed on maintenance medications. Albuterol four times daily for control of symptoms until follow up appointment.  - methylPREDNISolone sodium succinate (SOLU-MEDROL) 125 mg/2 mL injection 125 mg - levofloxacin (LEVAQUIN) 500 MG tablet; Take 1 tablet (500 mg total) by mouth daily.  Dispense: 7 tablet; Refill: 0  7. Anxiety Pt has not been taking medications as prescribed. Pt encouraged to restart medication and to report any problems with the medication.  - citalopram (CELEXA) 20 MG tablet; Take 1 tablet (20 mg total)  by mouth daily.  Dispense: 30 tablet; Refill: 3   Continue all other maintenance medications.  Follow up plan: Return in about 5 days (around 04/13/2018), or if symptoms worsen or fail to improve.  Educational handout given for COPD, smoking cessation   The above assessment and management plan was discussed with the patient. The patient verbalized understanding of and has agreed to the management plan. Patient is aware to call the clinic if symptoms persist or worsen. Patient is aware when to return to the clinic for a follow-up visit. Patient educated on when it is appropriate to  go to the emergency department.   Monia Pouch, FNP-C [facility] [phone]

## 2018-04-08 NOTE — Patient Instructions (Signed)
Ventolin inhaler 4 times per day Follow up in 5-7 days for spirometry testing  Stop Smoking   Steps to Quit Smoking Smoking tobacco can be bad for your health. It can also affect almost every organ in your body. Smoking puts you and people around you at risk for many serious long-lasting (chronic) diseases. Quitting smoking is hard, but it is one of the best things that you can do for your health. It is never too late to quit. What are the benefits of quitting smoking? When you quit smoking, you lower your risk for getting serious diseases and conditions. They can include:  Lung cancer or lung disease.  Heart disease.  Stroke.  Heart attack.  Not being able to have children (infertility).  Weak bones (osteoporosis) and broken bones (fractures).  If you have coughing, wheezing, and shortness of breath, those symptoms may get better when you quit. You may also get sick less often. If you are pregnant, quitting smoking can help to lower your chances of having a baby of low birth weight. What can I do to help me quit smoking? Talk with your doctor about what can help you quit smoking. Some things you can do (strategies) include:  Quitting smoking totally, instead of slowly cutting back how much you smoke over a period of time.  Going to in-person counseling. You are more likely to quit if you go to many counseling sessions.  Using resources and support systems, such as: ? Database administrator with a Social worker. ? Phone quitlines. ? Careers information officer. ? Support groups or group counseling. ? Text messaging programs. ? Mobile phone apps or applications.  Taking medicines. Some of these medicines may have nicotine in them. If you are pregnant or breastfeeding, do not take any medicines to quit smoking unless your doctor says it is okay. Talk with your doctor about counseling or other things that can help you.  Talk with your doctor about using more than one strategy at the same time,  such as taking medicines while you are also going to in-person counseling. This can help make quitting easier. What things can I do to make it easier to quit? Quitting smoking might feel very hard at first, but there is a lot that you can do to make it easier. Take these steps:  Talk to your family and friends. Ask them to support and encourage you.  Call phone quitlines, reach out to support groups, or work with a Social worker.  Ask people who smoke to not smoke around you.  Avoid places that make you want (trigger) to smoke, such as: ? Bars. ? Parties. ? Smoke-break areas at work.  Spend time with people who do not smoke.  Lower the stress in your life. Stress can make you want to smoke. Try these things to help your stress: ? Getting regular exercise. ? Deep-breathing exercises. ? Yoga. ? Meditating. ? Doing a body scan. To do this, close your eyes, focus on one area of your body at a time from head to toe, and notice which parts of your body are tense. Try to relax the muscles in those areas.  Download or buy apps on your mobile phone or tablet that can help you stick to your quit plan. There are many free apps, such as QuitGuide from the [employer] Office manager for Disease Control and Prevention). You can find more support from smokefree.gov and other websites.  This information is not intended to replace advice given to you by your health  care provider. Make sure you discuss any questions you have with your health care provider. Document Released: 05/03/2009 Document Revised: 03/04/2016 Document Reviewed: 11/21/2014 Elsevier Interactive Patient Education  2018 [employer]. Albuterol inhalation aerosol What is this medicine? ALBUTEROL (al [place]) is a bronchodilator. It helps open up the airways in your lungs to make it easier to breathe. This medicine is used to treat and to prevent bronchospasm. This medicine may be used for other purposes; ask your health care provider or pharmacist  if you have questions. COMMON BRAND NAME(S): Proair HFA, Proventil, Proventil HFA, Respirol, Ventolin, Ventolin HFA What should I tell my health care provider before I take this medicine? They need to know if you have any of the following conditions: -diabetes -heart disease or irregular heartbeat -high blood pressure -pheochromocytoma -seizures -thyroid disease -an unusual or allergic reaction to albuterol, levalbuterol, sulfites, other medicines, foods, dyes, or preservatives -pregnant or trying to get pregnant -breast-feeding How should I use this medicine? This medicine is for inhalation through the mouth. Follow the directions on your prescription label. Take your medicine at regular intervals. Do not use more often than directed. Make sure that you are using your inhaler correctly. Ask you doctor or health care provider if you have any questions. Talk to your pediatrician regarding the use of this medicine in children. Special care may be needed. Overdosage: If you think you have taken too much of this medicine contact a poison control center or emergency room at once. NOTE: This medicine is only for you. Do not share this medicine with others. What if I miss a dose? If you miss a dose, use it as soon as you can. If it is almost time for your next dose, use only that dose. Do not use double or extra doses. What may interact with this medicine? -anti-infectives like chloroquine and pentamidine -caffeine -cisapride -diuretics -medicines for colds -medicines for depression or for emotional or psychotic conditions -medicines for weight loss including some herbal products -methadone -some antibiotics like clarithromycin, erythromycin, levofloxacin, and linezolid -some heart medicines -steroid hormones like dexamethasone, cortisone, hydrocortisone -theophylline -thyroid hormones This list may not describe all possible interactions. Give your health care provider a list of all the  medicines, herbs, non-prescription drugs, or dietary supplements you use. Also tell them if you smoke, drink alcohol, or use illegal drugs. Some items may interact with your medicine. What should I watch for while using this medicine? Tell your doctor or health care professional if your symptoms do not improve. Do not use extra albuterol. If your asthma or bronchitis gets worse while you are using this medicine, call your doctor right away. If your mouth gets dry try chewing sugarless gum or sucking hard candy. Drink water as directed. What side effects may I notice from receiving this medicine? Side effects that you should report to your doctor or health care professional as soon as possible: -allergic reactions like skin rash, itching or hives, swelling of the face, lips, or tongue -breathing problems -chest pain -feeling faint or lightheaded, falls -high blood pressure -irregular heartbeat -fever -muscle cramps or weakness -pain, tingling, numbness in the hands or feet -vomiting Side effects that usually do not require medical attention (report to your doctor or health care professional if they continue or are bothersome): -cough -difficulty sleeping -headache -nervousness or trembling -stomach upset -stuffy or runny nose -throat irritation -unusual taste This list may not describe all possible side effects. Call your doctor for medical advice about  side effects. You may report side effects to FDA at 1-800-FDA-1088. Where should I keep my medicine? Keep out of the reach of children. Store at room temperature between 15 and 30 degrees C (59 and 86 degrees F). The contents are under pressure and may burst when exposed to heat or flame. Do not freeze. This medicine does not work as well if it is too cold. Throw away any unused medicine after the expiration date. Inhalers need to be thrown away after the labeled number of puffs have been used or by the expiration date; whichever comes  first. Ventolin HFA should be thrown away 12 months after removing from foil pouch. Check the instructions that come with your medicine. NOTE: This sheet is a summary. It may not cover all possible information. If you have questions about this medicine, talk to your doctor, pharmacist, or health care provider.  2018 Elsevier/Gold Standard (2012-12-23 10:57:17)  Chronic Obstructive Pulmonary Disease Chronic obstructive pulmonary disease (COPD) is a long-term (chronic) lung problem. When you have COPD, it is hard for air to get in and out of your lungs. The way your lungs work will never return to normal. Usually the condition gets worse over time. There are things you can do to keep yourself as healthy as possible. Your doctor may treat your condition with:  Medicines.  Quitting smoking, if you smoke.  Rehabilitation. This may involve a team of specialists.  Oxygen.  Exercise and changes to your diet.  Lung surgery.  Comfort measures (palliative care).  Follow these instructions at home: Medicines  Take over-the-counter and prescription medicines only as told by your doctor.  Talk to your doctor before taking any cough or allergy medicines. You may need to avoid medicines that cause your lungs to be dry. Lifestyle  If you smoke, stop. Smoking makes the problem worse. If you need help quitting, ask your doctor.  Avoid being around things that make your breathing worse. This may include smoke, chemicals, and fumes.  Stay active, but remember to also rest.  Learn and use tips on how to relax.  Make sure you get enough sleep. Most adults need at least 7 hours a night.  Eat healthy foods. Eat smaller meals more often. Rest before meals. Controlled breathing  Learn and use tips on how to control your breathing as told by your doctor. Try: ? Breathing in (inhaling) through your nose for 1 second. Then, pucker your lips and breath out (exhale) through your lips for 2  seconds. ? Putting one hand on your belly (abdomen). Breathe in slowly through your nose for 1 second. Your hand on your belly should move out. Pucker your lips and breathe out slowly through your lips. Your hand on your belly should move in as you breathe out. Controlled coughing  Learn and use controlled coughing to clear mucus from your lungs. The steps are: 1. Lean your head a little forward. 2. Breathe in deeply. 3. Try to hold your breath for 3 seconds. 4. Keep your mouth slightly open while coughing 2 times. 5. Spit any mucus out into a tissue. 6. Rest and do the steps again 1 or 2 times as needed. General instructions  Make sure you get all the shots (vaccines) that your doctor recommends. Ask your doctor about a flu shot and a pneumonia shot.  Use oxygen therapy and therapy to help improve your lungs (pulmonary rehabilitation) if told by your doctor. If you need home oxygen therapy, ask your doctor if you  should buy a tool to measure your oxygen level (oximeter).  Make a COPD action plan with your doctor. This helps you know what to do if you feel worse than usual.  Manage any other conditions you have as told by your doctor.  Avoid going outside when it is very hot, cold, or humid.  Avoid people who have a sickness you can catch (contagious).  Keep all follow-up visits as told by your doctor. This is important. Contact a doctor if:  You cough up more mucus than usual.  There is a change in the color or thickness of the mucus.  It is harder to breathe than usual.  Your breathing is faster than usual.  You have trouble sleeping.  You need to use your medicines more often than usual.  You have trouble doing your normal activities such as getting dressed or walking around the house. Get help right away if:  You have shortness of breath while resting.  You have shortness of breath that stops you from: ? Being able to talk. ? Doing normal activities.  Your chest  hurts for longer than 5 minutes.  Your skin color is more blue than usual.  Your pulse oximeter shows that you have low oxygen for longer than 5 minutes.  You have a fever.  You feel too tired to breathe normally. Summary  Chronic obstructive pulmonary disease (COPD) is a long-term lung problem.  The way your lungs work will never return to normal. Usually the condition gets worse over time. There are things you can do to keep yourself as healthy as possible.  Take over-the-counter and prescription medicines only as told by your doctor.  If you smoke, stop. Smoking makes the problem worse. This information is not intended to replace advice given to you by your health care provider. Make sure you discuss any questions you have with your health care provider. Document Released: 12/24/2007 Document Revised: 12/13/2015 Document Reviewed: 03/03/2013 Elsevier Interactive Patient Education  2017 [employer]. and re-evaluation

## 2018-04-13 ENCOUNTER — Observation Stay (HOSPITAL_COMMUNITY)
Admission: EM | Admit: 2018-04-13 | Discharge: 2018-04-14 | Disposition: A | Discharge status: Home / Self Care | Payer: Medicare Other | Attending: Internal Medicine | Admitting: Internal Medicine

## 2018-04-13 ENCOUNTER — Encounter (HOSPITAL_COMMUNITY): Payer: Self-pay

## 2018-04-13 ENCOUNTER — Emergency Department (HOSPITAL_COMMUNITY): Payer: Medicare Other

## 2018-04-13 ENCOUNTER — Other Ambulatory Visit: Payer: Self-pay

## 2018-04-13 DIAGNOSIS — J441 Chronic obstructive pulmonary disease with (acute) exacerbation: Secondary | ICD-10-CM | POA: Diagnosis not present

## 2018-04-13 DIAGNOSIS — Z79899 Other long term (current) drug therapy: Secondary | ICD-10-CM | POA: Insufficient documentation

## 2018-04-13 DIAGNOSIS — J9600 Acute respiratory failure, unspecified whether with hypoxia or hypercapnia: Secondary | ICD-10-CM | POA: Diagnosis present

## 2018-04-13 DIAGNOSIS — R0902 Hypoxemia: Secondary | ICD-10-CM

## 2018-04-13 DIAGNOSIS — F17200 Nicotine dependence, unspecified, uncomplicated: Secondary | ICD-10-CM | POA: Insufficient documentation

## 2018-04-13 DIAGNOSIS — J9601 Acute respiratory failure with hypoxia: Secondary | ICD-10-CM | POA: Diagnosis not present

## 2018-04-13 DIAGNOSIS — I10 Essential (primary) hypertension: Secondary | ICD-10-CM | POA: Diagnosis not present

## 2018-04-13 DIAGNOSIS — Z7982 Long term (current) use of aspirin: Secondary | ICD-10-CM | POA: Insufficient documentation

## 2018-04-13 LAB — CBC WITH DIFFERENTIAL/PLATELET
BASOS PCT: 0 %
Basophils Absolute: 0 10*3/uL (ref 0.0–0.1)
EOS ABS: 0 10*3/uL (ref 0.0–0.7)
EOS PCT: 0 %
HCT: 40.2 % (ref 36.0–46.0)
Hemoglobin: 13.4 g/dL (ref 12.0–15.0)
Lymphocytes Relative: 11 %
Lymphs Abs: 1.1 10*3/uL (ref 0.7–4.0)
MCH: 31 pg (ref 26.0–34.0)
MCHC: 33.3 g/dL (ref 30.0–36.0)
MCV: 93.1 fL (ref 78.0–100.0)
Monocytes Absolute: 0.8 10*3/uL (ref 0.1–1.0)
Monocytes Relative: 8 %
NEUTROS ABS: 8.1 10*3/uL — AB (ref 1.7–7.7)
Neutrophils Relative %: 81 %
PLATELETS: 240 10*3/uL (ref 150–400)
RBC: 4.32 MIL/uL (ref 3.87–5.11)
RDW: 13.1 % (ref 11.5–15.5)
WBC: 10.1 10*3/uL (ref 4.0–10.5)

## 2018-04-13 LAB — BASIC METABOLIC PANEL
Anion gap: 3 — ABNORMAL LOW (ref 5–15)
BUN: 15 mg/dL (ref 8–23)
CO2: 32 mmol/L (ref 22–32)
CREATININE: 1.16 mg/dL — AB (ref 0.44–1.00)
Calcium: 8.9 mg/dL (ref 8.9–10.3)
Chloride: 95 mmol/L — ABNORMAL LOW (ref 98–111)
GFR, EST AFRICAN AMERICAN: 48 mL/min — AB (ref >60)
GFR, EST NON AFRICAN AMERICAN: 41 mL/min — AB (ref >60)
Glucose, Bld: 128 mg/dL — ABNORMAL HIGH (ref 70–99)
Potassium: 4.5 mmol/L (ref 3.5–5.1)
SODIUM: 130 mmol/L — AB (ref 135–145)

## 2018-04-13 LAB — TROPONIN I: Troponin I: <0.03 ng/mL (ref <0.03)

## 2018-04-13 MED ORDER — ONDANSETRON HCL 4 MG/2ML IJ SOLN: 4.0000 mg | Freq: Four times a day (QID) | INTRAMUSCULAR | Status: DC | PRN

## 2018-04-13 MED ORDER — SODIUM CHLORIDE 0.9 % IV SOLN
INTRAVENOUS | Status: DC
  Administered 2018-04-13 – 2018-04-14 (×2): via INTRAVENOUS

## 2018-04-13 MED ORDER — ACETAMINOPHEN 650 MG RE SUPP: 650.0000 mg | Freq: Four times a day (QID) | RECTAL | Status: DC | PRN

## 2018-04-13 MED ORDER — ASPIRIN EC 81 MG PO TBEC
81.0000 mg | DELAYED_RELEASE_TABLET | Freq: Every day | ORAL | Status: DC
  Administered 2018-04-14: 81 mg via ORAL
  Filled 2018-04-13: qty 1

## 2018-04-13 MED ORDER — ENOXAPARIN SODIUM 40 MG/0.4ML ~~LOC~~ SOLN
40.0000 mg | SUBCUTANEOUS | Status: DC
  Administered 2018-04-13: 40 mg via SUBCUTANEOUS
  Filled 2018-04-13: qty 0.4

## 2018-04-13 MED ORDER — IPRATROPIUM-ALBUTEROL 0.5-2.5 (3) MG/3ML IN SOLN
3.0000 mL | Freq: Once | RESPIRATORY_TRACT | Status: AC
  Administered 2018-04-13: 3 mL via RESPIRATORY_TRACT
  Filled 2018-04-13: qty 3

## 2018-04-13 MED ORDER — ALBUTEROL SULFATE (2.5 MG/3ML) 0.083% IN NEBU: 2.5000 mg | INHALATION_SOLUTION | Freq: Four times a day (QID) | RESPIRATORY_TRACT | Status: DC | PRN

## 2018-04-13 MED ORDER — ACETAMINOPHEN 325 MG PO TABS: 650.0000 mg | ORAL_TABLET | Freq: Four times a day (QID) | ORAL | Status: DC | PRN

## 2018-04-13 MED ORDER — POLYETHYLENE GLYCOL 3350 17 G PO PACK: 17.0000 g | PACK | Freq: Every day | ORAL | Status: DC | PRN

## 2018-04-13 MED ORDER — IOPAMIDOL (ISOVUE-370) INJECTION 76%
100.0000 mL | Freq: Once | INTRAVENOUS | Status: AC | PRN
  Administered 2018-04-13: 100 mL via INTRAVENOUS

## 2018-04-13 MED ORDER — ALPRAZOLAM 0.5 MG PO TABS
0.5000 mg | ORAL_TABLET | Freq: Two times a day (BID) | ORAL | Status: DC | PRN
  Administered 2018-04-13: 0.5 mg via ORAL
  Filled 2018-04-13: qty 1

## 2018-04-13 MED ORDER — ONDANSETRON HCL 4 MG PO TABS: 4.0000 mg | ORAL_TABLET | Freq: Four times a day (QID) | ORAL | Status: DC | PRN

## 2018-04-13 MED ORDER — AMLODIPINE BESYLATE 5 MG PO TABS: 5.0000 mg | ORAL_TABLET | Freq: Every day | ORAL | Status: DC

## 2018-04-13 MED ORDER — IPRATROPIUM BROMIDE 0.02 % IN SOLN: 0.5000 mg | Freq: Four times a day (QID) | RESPIRATORY_TRACT | Status: DC | PRN

## 2018-04-13 MED ORDER — DEXAMETHASONE SODIUM PHOSPHATE 4 MG/ML IJ SOLN
10.0000 mg | Freq: Once | INTRAMUSCULAR | Status: AC
  Administered 2018-04-13: 10 mg via INTRAVENOUS
  Filled 2018-04-13: qty 3

## 2018-04-13 MED ORDER — PREDNISONE 20 MG PO TABS
40.0000 mg | ORAL_TABLET | Freq: Every day | ORAL | Status: DC
  Administered 2018-04-13: 40 mg via ORAL
  Filled 2018-04-13 (×2): qty 2

## 2018-04-13 MED ORDER — CITALOPRAM HYDROBROMIDE 20 MG PO TABS
20.0000 mg | ORAL_TABLET | Freq: Every day | ORAL | Status: DC
  Administered 2018-04-14: 20 mg via ORAL
  Filled 2018-04-13: qty 1

## 2018-04-13 NOTE — ED Notes (Signed)
O2 at 3 L/min via [place] initiated sats up to 90 %

## 2018-04-13 NOTE — ED Notes (Signed)
Patient walked to bathroom and towards nurses station.  Oxygen  Saturation at 77% room air.

## 2018-04-13 NOTE — H&P (Signed)
TRH H&P    Patient Demographics:    Kelly Peters, is a 84 y.o. female  MRN: [id number]  DOB - 1930-07-04  Admit Date - 04/13/2018  Referring MD/NP/PA: Dr. Dina Rich  Outpatient Primary MD for the patient is Terald Sleeper, PA-C  Patient coming from: Home  Chief complaint-hypoxia   HPI:    Kelly Peters  is a 84 y.o. female, was brought to the hospital with chief complaint of not feeling well.  Patient's daughter has pulse oximetry at home and checked that she was hypoxic at home.  Patient denies any shortness of breath.  No chest pain.  Denies coughing up any phlegm. Patient was recently seen at her PCP office on 919 at that time she had bilateral wheezing, cough.  She was treated for COPD exacerbation with 1 dose of Solu-Medrol and discharged on Levaquin with inhaler.  She received DuoNeb in the office with increase of O2 sats from 86% to 93% on room air. Today O2 sats in the ED is 85% on room air. Patient was diagnosed with COPD many years ago but has not required any treatment as per patient's daughter. Patient still smokes 1 pack of cigarettes per day. No chest pain. Denies nausea vomiting or diarrhea. Denies fever or chills. Chest x-ray shows no acute changes.    Review of systems:    In addition to the HPI above,   All other systems reviewed and are negative.   With Past History of the following :    Past Medical History:  Diagnosis Date  . Hypertension       Past Surgical History:  Procedure Laterality Date  . ABDOMINAL HYSTERECTOMY    . CATARACT EXTRACTION W/PHACO Left 10/03/2016   Procedure: CATARACT EXTRACTION PHACO AND INTRAOCULAR LENS PLACEMENT LEFT EYE CDE - 25.42;  Surgeon: Baruch Goldmann, MD;  Location: [facility];  Service: Ophthalmology;  Laterality: Left;  left  . CATARACT EXTRACTION W/PHACO Right 10/24/2016   Procedure: CATARACT EXTRACTION PHACO AND INTRAOCULAR LENS PLACEMENT RIGHT  EYE CDE= 24.99;  Surgeon: Baruch Goldmann, MD;  Location: [facility];  Service: Ophthalmology;  Laterality: Right;  right      Social History:      Social History   Tobacco Use  . Smoking status: Current Every Day Smoker    Packs/day: 1.00    Years: 50.00    Pack years: 50.00  . Smokeless tobacco: Never Used  Substance Use Topics  . Alcohol use: No       Family History :   Noncontributory   Home Medications:   Prior to Admission medications   Medication Sig Start Date End Date Taking? Authorizing Provider  albuterol (PROVENTIL HFA;VENTOLIN HFA) 108 (90 Base) MCG/ACT inhaler Inhale 2 puffs into the lungs QID. 04/08/18  Yes Rakes, Connye Burkitt, FNP  ALPRAZolam Duanne Moron) 0.5 MG tablet TAKE ONE TABLET TWICE DAILY AS NEEDED Patient taking differently: Take 0.5 mg by mouth 2 (two) times daily as needed for anxiety.  11/05/17  Yes Terald Sleeper, PA-C  amLODipine (NORVASC) 5 MG tablet TAKE  ONE (1) TABLET EACH DAY Patient taking differently: Take 5 mg by mouth daily.  03/23/18  Yes Terald Sleeper, PA-C  aspirin EC 81 MG tablet Take 81 mg by mouth daily.   Yes [provider]  citalopram (CELEXA) 20 MG tablet Take 1 tablet (20 mg total) by mouth daily. 04/08/18  Yes Rakes, Connye Burkitt, FNP  levofloxacin (LEVAQUIN) 500 MG tablet Take 1 tablet (500 mg total) by mouth daily. 04/08/18  Yes Rakes, Connye Burkitt, FNP  meloxicam (MOBIC) 7.5 MG tablet Take 7.5 mg by mouth daily as needed for pain.   Yes [provider]  polyethylene glycol (MIRALAX / GLYCOLAX) packet Take 17 g by mouth daily as needed for moderate constipation.    Yes [provider]  senna (SENOKOT) 8.6 MG TABS tablet Take 1 tablet (8.6 mg total) by mouth daily. Patient taking differently: Take 1 tablet by mouth at bedtime.  09/26/16  Yes Terald Sleeper, PA-C     Allergies:     Allergies  Allergen Reactions  . Penicillins Rash    Has patient had a PCN reaction causing immediate rash, facial/tongue/throat swelling, SOB or  lightheadedness with hypotension:No Has patient had a PCN reaction causing severe rash involving mucus membranes or skin necrosis:Yes Has patient had a PCN reaction that required hospitalization:No Has patient had a PCN reaction occurring within the last 10 years:No If all of the above answers are "NO", then may proceed with Cephalosporin use.      Physical Exam:   Vitals  Blood pressure 128/60, pulse 94, temperature 97.9 F (36.6 C), temperature source Oral, resp. rate (!) 21, weight 46.7 kg, SpO2 94 %.  1.  General: Appears in no acute distress  2. Psychiatric:  Intact judgement and  insight, awake alert, oriented x 3.  3. Neurologic: No focal neurological deficits, all cranial nerves intact.Strength 5/5 all 4 extremities, sensation intact all 4 extremities, plantars down going.  4. Eyes :  anicteric sclerae, moist conjunctivae with no lid lag. PERRLA.  5. ENMT:  Oropharynx clear with moist mucous membranes and good dentition  6. Neck:  supple, no cervical lymphadenopathy appriciated, No thyromegaly  7. Respiratory : Normal respiratory effort, good air movement bilaterally,clear to  auscultation bilaterally  8. Cardiovascular : RRR, no gallops, rubs or murmurs, no leg edema  9. Gastrointestinal:  Positive bowel sounds, abdomen soft, non-tender to palpation,no hepatosplenomegaly, no rigidity or guarding       10. Skin:  No cyanosis, normal texture and turgor, no rash, lesions or ulcers  11.Musculoskeletal:  Good muscle tone,  joints appear normal , no effusions,  normal range of motion    Data Review:    CBC Recent Labs  Lab 04/13/18 1949  WBC 10.1  HGB 13.4  HCT 40.2  PLT 240  MCV 93.1  MCH 31.0  MCHC 33.3  RDW 13.1  LYMPHSABS 1.1  MONOABS 0.8  EOSABS 0.0  BASOSABS 0.0   ------------------------------------------------------------------------------------------------------------------  Results for orders placed or performed during the hospital  encounter of 04/13/18 (from the past 48 hour(s))  CBC with Differential     Status: Abnormal   Collection Time: 04/13/18  7:49 PM  Result Value Ref Range   WBC 10.1 4.0 - 10.5 K/uL   RBC 4.32 3.87 - 5.11 MIL/uL   Hemoglobin 13.4 12.0 - 15.0 g/dL   HCT 40.2 36.0 - 46.0 %   MCV 93.1 78.0 - 100.0 fL   MCH 31.0 26.0 - 34.0 pg   MCHC  33.3 30.0 - 36.0 g/dL   RDW 13.1 11.5 - 15.5 %   Platelets 240 150 - 400 K/uL   Neutrophils Relative % 81 %   Neutro Abs 8.1 (H) 1.7 - 7.7 K/uL   Lymphocytes Relative 11 %   Lymphs Abs 1.1 0.7 - 4.0 K/uL   Monocytes Relative 8 %   Monocytes Absolute 0.8 0.1 - 1.0 K/uL   Eosinophils Relative 0 %   Eosinophils Absolute 0.0 0.0 - 0.7 K/uL   Basophils Relative 0 %   Basophils Absolute 0.0 0.0 - 0.1 K/uL    Comment: Performed at [facility], [address] [place], [place] [address]  Basic metabolic panel     Status: Abnormal   Collection Time: 04/13/18  7:49 PM  Result Value Ref Range   Sodium 130 (L) 135 - 145 mmol/L   Potassium 4.5 3.5 - 5.1 mmol/L   Chloride 95 (L) 98 - 111 mmol/L   CO2 32 22 - 32 mmol/L   Glucose, Bld 128 (H) 70 - 99 mg/dL   BUN 15 8 - 23 mg/dL   Creatinine, Ser 1.16 (H) 0.44 - 1.00 mg/dL   Calcium 8.9 8.9 - 10.3 mg/dL   GFR calc non Af Amer 41 (L) >60 mL/min   GFR calc Af Amer 48 (L) >60 mL/min    Comment: (NOTE) The eGFR has been calculated using the CKD EPI equation. This calculation has not been validated in all clinical situations. eGFR's persistently <60 mL/min signify possible Chronic Kidney Disease.    Anion gap 3 (L) 5 - 15    Comment: Performed at [facility], [address], [place], [place] [address]  Troponin I     Status: None   Collection Time: 04/13/18  7:49 PM  Result Value Ref Range   Troponin I <0.03 <0.03 ng/mL    Comment: Performed at [facility], [address] [place], [place] [address]    Chemistries  Recent Labs  Lab 04/13/18 1949  NA 130*  K 4.5  CL 95*  CO2 32  GLUCOSE 128*  BUN 15    CREATININE 1.16*  CALCIUM 8.9   ------------------------------------------------------------------------------------------------------------------  ------------------------------------------------------------------------------------------------------------------ GFR: Estimated Creatinine Clearance: 25.7 mL/min (A) (by C-G formula based on SCr of 1.16 mg/dL (H)). Liver Function Tests: No results for input(s): AST, ALT, ALKPHOS, BILITOT, PROT, ALBUMIN in the last 168 hours. No results for input(s): LIPASE, AMYLASE in the last 168 hours. No results for input(s): AMMONIA in the last 168 hours. Coagulation Profile: No results for input(s): INR, PROTIME in the last 168 hours. Cardiac Enzymes: Recent Labs  Lab 04/13/18 1949  TROPONINI <0.03   BNP (last 3 results) No results for input(s): PROBNP in the last 8760 hours. HbA1C: No results for input(s): HGBA1C in the last 72 hours. CBG: No results for input(s): GLUCAP in the last 168 hours. Lipid Profile: No results for input(s): CHOL, HDL, LDLCALC, TRIG, CHOLHDL, LDLDIRECT in the last 72 hours. Thyroid Function Tests: No results for input(s): TSH, T4TOTAL, FREET4, T3FREE, THYROIDAB in the last 72 hours. Anemia Panel: No results for input(s): VITAMINB12, FOLATE, FERRITIN, TIBC, IRON, RETICCTPCT in the last 72 hours.  --------------------------------------------------------------------------------------------------------------- Urine analysis:    Component Value Date/Time   COLORURINE STRAW (A) 06/23/2017 0830   APPEARANCEUR Clear 02/11/2018 1148   LABSPEC 1.003 (L) 06/23/2017 0830   PHURINE 6.0 06/23/2017 0830   GLUCOSEU Negative 02/11/2018 1148   HGBUR NEGATIVE 06/23/2017 0830   BILIRUBINUR Negative 02/11/2018 1148   KETONESUR NEGATIVE 06/23/2017  0830   PROTEINUR Negative 02/11/2018 1148   PROTEINUR NEGATIVE 06/23/2017 0830   NITRITE Negative 02/11/2018 1148   NITRITE NEGATIVE 06/23/2017 0830   LEUKOCYTESUR Trace (A)  02/11/2018 1148      Imaging Results:    Dg Chest 2 View  Result Date: 04/13/2018 CLINICAL DATA:  Shortness of breath. EXAM: CHEST - 2 VIEW COMPARISON:  Radiographs of April 08, 2018. FINDINGS: The heart size and mediastinal contours are within normal limits. Both lungs are clear. Atherosclerosis of thoracic aorta is noted. Hyperexpansion of the lungs is noted. The visualized skeletal structures are unremarkable. IMPRESSION: Findings consistent with chronic obstructive pulmonary disease. No active cardiopulmonary disease. Aortic Atherosclerosis (ICD10-I70.0). Electronically Signed   By: Marijo Conception, M.D.   On: 04/13/2018 19:28    My personal review of EKG: Rhythm NSR   Assessment & Plan:    Active Problems:   Acute respiratory failure ([place])   1. Acute respiratory failure-patient is hypoxic in the ED, on 3 L of oxygen via nasal cannula.  She may have underlying COPD but we will also do a CT of the chest to rule out pulmonary embolism.  If PE is negative, consider discharging patient home on oxygen in next 24 hours.  2. COPD-not in exacerbation, O2 sats dropping to 85% on room air.  Will start prednisone taper, start prednisone 40 mg today and to put over taper  next few days.  3. Hypertension-blood pressure stable, continue Norvasc    DVT Prophylaxis-   Lovenox   AM Labs Ordered, also please review Full Orders  Family Communication: Admission, patients condition and plan of care including tests being ordered have been discussed with the patient her daughters at bedside who indicate understanding and agree with the plan and Code Status.  Code Status: DNR  Admission status: Observation  Time spent in minutes : 60 minutes   Oswald Hillock M.D on 04/13/2018 at 10:15 PM  Between 7am to 7pm - Pager - [phone]. After 7pm go to [email or web address] - password [facility]  [facility] - Office  [phone]

## 2018-04-13 NOTE — ED Provider Notes (Signed)
[facility] EMERGENCY DEPARTMENT Provider Note   CSN: [id number] Arrival date & time: 04/13/18  1854     History   Chief Complaint Chief Complaint  Patient presents with  . Shortness of Breath    HPI Kelly Peters is a 84 y.o. female.  HPI  This is an 84 year old female who presents with continued shortness of breath.  Patient was seen and evaluated at her primary office on 9/19.  She was presumptively treated for COPD.  No formal diagnosis in the past although she has a history of significant smoking and continues to smoke.  Patient reports that in general she has been very fatigued.  She has not felt shortness of breath or chest pain.  She had a cough and was treated with Levaquin but reports that she finished a course of that yesterday.  She also reports that she received a steroid shot and was started on an inhaler which seems to help "sometimes."  She is not on home oxygen at baseline.  She states in general she just has not gotten much better.  She states that she generally has not had any appetite and she feels generally weak.  No fevers at home.  Patient denies leg swelling or history of heart disease.  I have reviewed her office records.  She received 1 dose of Solu-Medrol and was discharged with Levaquin and an inhaler.  Presumptive diagnosis of COPD.  She received a DuoNeb in office with increase of O2 sats from 86% to 93% on room air.  O2 sats at triage today 85% on room air.  Past Medical History:  Diagnosis Date  . Hypertension     Patient Active Problem List   Diagnosis Date Noted  . Chronic fatigue 04/08/2018  . Depression, recurrent ([place]) 07/06/2017  . Cystocele, midline 07/06/2017  . Essential hypertension 09/26/2016  . Anxiety 09/26/2016  . Constipation 09/26/2016  . Acute cystitis without hematuria 09/26/2016    Past Surgical History:  Procedure Laterality Date  . ABDOMINAL HYSTERECTOMY    . CATARACT EXTRACTION W/PHACO Left 10/03/2016   Procedure:  CATARACT EXTRACTION PHACO AND INTRAOCULAR LENS PLACEMENT LEFT EYE CDE - 25.42;  Surgeon: Baruch Goldmann, MD;  Location: [facility];  Service: Ophthalmology;  Laterality: Left;  left  . CATARACT EXTRACTION W/PHACO Right 10/24/2016   Procedure: CATARACT EXTRACTION PHACO AND INTRAOCULAR LENS PLACEMENT RIGHT EYE CDE= 24.99;  Surgeon: Baruch Goldmann, MD;  Location: [facility];  Service: Ophthalmology;  Laterality: Right;  right     OB History   None      Home Medications    Prior to Admission medications   Medication Sig Start Date End Date Taking? Authorizing Provider  acetaminophen (TYLENOL) 500 MG tablet Take 500 mg by mouth as needed for moderate pain or headache.    [provider]  albuterol (PROVENTIL HFA;VENTOLIN HFA) 108 (90 Base) MCG/ACT inhaler Inhale 2 puffs into the lungs QID. 04/08/18   Rakes, Connye Burkitt, FNP  ALPRAZolam Duanne Moron) 0.5 MG tablet TAKE ONE TABLET TWICE DAILY AS NEEDED 11/05/17   Terald Sleeper, PA-C  amLODipine (NORVASC) 5 MG tablet TAKE ONE (1) TABLET EACH DAY 03/23/18   Terald Sleeper, PA-C  aspirin EC 81 MG tablet Take 81 mg by mouth daily.    [provider]  citalopram (CELEXA) 20 MG tablet Take 1 tablet (20 mg total) by mouth daily. 04/08/18   Baruch Gouty, FNP  hydrocortisone (ANUSOL-HC) 2.5 % rectal cream Place 1 application 2 (two) times  daily rectally. 06/08/17   Terald Sleeper, PA-C  levofloxacin (LEVAQUIN) 500 MG tablet Take 1 tablet (500 mg total) by mouth daily. 04/08/18   Baruch Gouty, FNP  meloxicam (MOBIC) 7.5 MG tablet Take 7.5 mg by mouth daily as needed for pain.    [provider]  polyethylene glycol (MIRALAX / GLYCOLAX) packet Take 17 g by mouth daily as needed for moderate constipation.     [provider]  senna (SENOKOT) 8.6 MG TABS tablet Take 1 tablet (8.6 mg total) by mouth daily. Patient taking differently: Take 1 tablet by mouth at bedtime.  09/26/16   Terald Sleeper, PA-C    Family History No family history on  file.  Social History Social History   Tobacco Use  . Smoking status: Current Every Day Smoker    Packs/day: 1.00    Years: 50.00    Pack years: 50.00  . Smokeless tobacco: Never Used  Substance Use Topics  . Alcohol use: No  . Drug use: No     Allergies   Penicillins   Review of Systems Review of Systems  Constitutional: Positive for appetite change. Negative for fever.  Respiratory: Positive for cough. Negative for shortness of breath.   Cardiovascular: Negative for chest pain.  Gastrointestinal: Negative for abdominal pain, nausea and vomiting.  Genitourinary: Negative for dysuria.  All other systems reviewed and are negative.    Physical Exam Updated Vital Signs BP 123/61   Pulse 82   Temp 97.9 F (36.6 C) (Oral)   Resp (!) 34   Wt 46.7 kg   SpO2 98%   BMI 19.46 kg/m   Physical Exam  Constitutional: She is oriented to person, place, and time.  Elderly, nontoxic-appearing  HENT:  Head: Normocephalic and atraumatic.  Eyes: Pupils are equal, round, and reactive to light.  Neck: Neck supple.  Cardiovascular: Normal rate, regular rhythm and normal heart sounds.  Pulmonary/Chest: Effort normal. No respiratory distress. She has no wheezes.  Expiratory wheezing upper lung fields, diminished breath sounds lower lung fields, no tachypnea or respiratory distress noted  Abdominal: Soft. Bowel sounds are normal.  Musculoskeletal:       Right lower leg: She exhibits no edema.       Left lower leg: She exhibits no edema.  Neurological: She is alert and oriented to person, place, and time.  Skin: Skin is warm and dry.  Psychiatric: She has a normal mood and affect.  Nursing note and vitals reviewed.    ED Treatments / Results  Labs (all labs ordered are listed, but only abnormal results are displayed) Labs Reviewed  CBC WITH DIFFERENTIAL/PLATELET - Abnormal; Notable for the following components:      Result Value   Neutro Abs 8.1 (*)    All other components  within normal limits  BASIC METABOLIC PANEL - Abnormal; Notable for the following components:   Sodium 130 (*)    Chloride 95 (*)    Glucose, Bld 128 (*)    Creatinine, Ser 1.16 (*)    GFR calc non Af Amer 41 (*)    GFR calc Af Amer 48 (*)    Anion gap 3 (*)    All other components within normal limits  TROPONIN I    EKG EKG Interpretation  Date/Time:  Tuesday April 13 2018 19:33:53 EDT Ventricular Rate:  90 PR Interval:    QRS Duration: 86 QT Interval:  347 QTC Calculation: 425 R Axis:   60 Text Interpretation:  Sinus  rhythm Low voltage, extremity and precordial leads Confirmed by Thayer Jew [phone]) on 04/13/2018 8:38:29 PM   Radiology Dg Chest 2 View  Result Date: 04/13/2018 CLINICAL DATA:  Shortness of breath. EXAM: CHEST - 2 VIEW COMPARISON:  Radiographs of April 08, 2018. FINDINGS: The heart size and mediastinal contours are within normal limits. Both lungs are clear. Atherosclerosis of thoracic aorta is noted. Hyperexpansion of the lungs is noted. The visualized skeletal structures are unremarkable. IMPRESSION: Findings consistent with chronic obstructive pulmonary disease. No active cardiopulmonary disease. Aortic Atherosclerosis (ICD10-I70.0). Electronically Signed   By: Marijo Conception, M.D.   On: 04/13/2018 19:28    Procedures Procedures (including critical care time)  CRITICAL CARE Performed by: Merryl Hacker   Total critical care time: 40 minutes  Critical care time was exclusive of separately billable procedures and treating other patients.  Critical care was necessary to treat or prevent imminent or life-threatening deterioration.  Critical care was time spent personally by me on the following activities: development of treatment plan with patient and/or surrogate as well as nursing, discussions with consultants, evaluation of patient's response to treatment, examination of patient, obtaining history from patient or surrogate, ordering and  performing treatments and interventions, ordering and review of laboratory studies, ordering and review of radiographic studies, pulse oximetry and re-evaluation of patient's condition.   Medications Ordered in ED Medications  ipratropium-albuterol (DUONEB) 0.5-2.5 (3) MG/3ML nebulizer solution 3 mL (has no administration in time range)  ipratropium-albuterol (DUONEB) 0.5-2.5 (3) MG/3ML nebulizer solution 3 mL (3 mLs Nebulization Given 04/13/18 2023)  dexamethasone (DECADRON) injection 10 mg (10 mg Intravenous Given 04/13/18 2001)     Initial Impression / Assessment and Plan / ED Course  I have reviewed the triage vital signs and the nursing notes.  Pertinent labs & imaging results that were available during my care of the patient were reviewed by me and considered in my medical decision making (see chart for details).     Presents with persistent weakness.  Found to be hypoxic in her primary office several days ago.  Was treated with 1 dose of steroids in office and was sent home with this inhaler and antibiotics.  Reports that she continues to feel generally weak.  No significant shortness of breath but was hypoxic upon arrival.  She is afebrile.  She continues to smoke.  Denies chest pain.  EKG without ischemic changes.  Troponin negative.  Lab work notable for mild hyponatremia.  Patient was given DuoNeb x2 with stable wheezing on exam.  She is in no respiratory distress but requiring 3 L of oxygen.  Attempted ambulation after DuoNeb's with decrease in O2 sats to 77% with minimal ambulation.  Given wheezing, suspect undiagnosed COPD.  Patient may require oxygen at home.  No concerns at this time for PE.  Chest x-ray shows no evidence of pneumothorax or pneumonia.  We will plan for admission to the hospitalist.  Patient was given 1 dose IM Decadron.    Final Clinical Impressions(s) / ED Diagnoses   Final diagnoses:  COPD exacerbation [facility])  Hypoxia    ED Discharge Orders    None        Merryl Hacker, MD 04/13/18 2131

## 2018-04-13 NOTE — ED Triage Notes (Addendum)
Pt reports that she went to see the doctor last week and was told her O2 sat was 84 %. Pt reports that she isnt getting any better. Does not wear home O2. Pt has one dose of abt left

## 2018-04-14 ENCOUNTER — Other Ambulatory Visit: Payer: Self-pay

## 2018-04-14 DIAGNOSIS — J9601 Acute respiratory failure with hypoxia: Secondary | ICD-10-CM

## 2018-04-14 LAB — CBC
HCT: 36.6 % (ref 36.0–46.0)
Hemoglobin: 11.9 g/dL — ABNORMAL LOW (ref 12.0–15.0)
MCH: 30.2 pg (ref 26.0–34.0)
MCHC: 32.5 g/dL (ref 30.0–36.0)
MCV: 92.9 fL (ref 78.0–100.0)
Platelets: 208 10*3/uL (ref 150–400)
RBC: 3.94 MIL/uL (ref 3.87–5.11)
RDW: 13 % (ref 11.5–15.5)
WBC: 6.7 10*3/uL (ref 4.0–10.5)

## 2018-04-14 LAB — COMPREHENSIVE METABOLIC PANEL
ALBUMIN: 3.6 g/dL (ref 3.5–5.0)
ALT: 11 U/L (ref 0–44)
AST: 15 U/L (ref 15–41)
Alkaline Phosphatase: 71 U/L (ref 38–126)
Anion gap: 9 (ref 5–15)
BUN: 14 mg/dL (ref 8–23)
CHLORIDE: 97 mmol/L — AB (ref 98–111)
CO2: 27 mmol/L (ref 22–32)
CREATININE: 1.04 mg/dL — AB (ref 0.44–1.00)
Calcium: 8.9 mg/dL (ref 8.9–10.3)
GFR calc Af Amer: 55 mL/min — ABNORMAL LOW (ref >60)
GFR, EST NON AFRICAN AMERICAN: 47 mL/min — AB (ref >60)
GLUCOSE: 164 mg/dL — AB (ref 70–99)
POTASSIUM: 4.4 mmol/L (ref 3.5–5.1)
Sodium: 133 mmol/L — ABNORMAL LOW (ref 135–145)
Total Bilirubin: 0.8 mg/dL (ref 0.3–1.2)
Total Protein: 6.3 g/dL — ABNORMAL LOW (ref 6.5–8.1)

## 2018-04-14 MED ORDER — PREDNISONE 20 MG PO TABS: 40.0000 mg | ORAL_TABLET | Freq: Every day | ORAL | 0 refills | Status: AC

## 2018-04-14 NOTE — Progress Notes (Signed)
SATURATION QUALIFICATIONS: (This note is used to comply with regulatory documentation for home oxygen)  Patient Saturations on Room Air at Rest = 90%  Patient Saturations on Room Air while Ambulating = 84%  Patient Saturations on 3 Liters of oxygen while Ambulating = 88-90%  Please briefly explain why patient needs home oxygen: Pt will need oxygen at home due to desaturations while ambulating on room air.

## 2018-04-14 NOTE — Progress Notes (Signed)
Pt discharged home today per Dr. Manuella Ghazi. Pt's IV site D/C'd and WDL. Pt's VSS. Pt provided with home medication list, discharge instructions and prescriptions. Verbalized understanding. Pt left floor via WC in stable condition accompanied by NT.

## 2018-04-14 NOTE — Care Management Note (Signed)
Case Management Note  Patient Details  Name: Kelly Peters MRN: [id number] Date of Birth: December 31, 1929  Subjective/Objective:                  COPD.   Action/Plan: Discharging home today. Qualifies for oxygen. Elects [facility]. Vaughan Basta of [facility] notified, will deliver portable oxygen tank to room prior to DC. Patient and family at bedside.   Expected Discharge Date:  04/14/18               Expected Discharge Plan:  Home/Self Care  In-House Referral:     Discharge planning Services  CM Consult  Post Acute Care Choice:  Durable Medical Equipment Choice offered to:  Patient  DME Arranged:  Oxygen DME Agency:  [place]:    [facility] Agency:     Status of Service:  Completed, signed off  If discussed at [place] of Stay Meetings, dates discussed:    Additional Comments:  Scotlyn Mccranie, Chauncey [place], RN 04/14/2018, 10:36 AM

## 2018-04-14 NOTE — Discharge Summary (Signed)
Physician Discharge Summary  Kelly Peters WGN:[id number] DOB: [birth year] DOA: 04/13/2018  PCP: Terald Sleeper, PA-C  Admit date: 04/13/2018  Discharge date: 04/14/2018  Admitted From:Home  Disposition:  Home with home O2  Recommendations for Outpatient Follow-up:  1. Follow up with PCP in 1-2 weeks 2. Continue on home oxygen 3 L nasal cannula  Home Health: None  Equipment/Devices: Home oxygen 3 L nasal cannula  Discharge Condition: Stable  CODE STATUS: DNR  Diet recommendation: Heart Healthy  Brief/Interim Summary:  This is an 84 year old female with history of COPD and active tobacco abuse who was brought to the hospital with complaints of noted hypoxemia on pulse oximetry at home.  She actually denied any other complaints or concerns and was recently seen at her PCP office on 9/19 with noted bilateral wheezing and cough at that time.  She was diagnosed with COPD exacerbation and some bronchiectasis for which she was given 1 dose of Solu-Medrol and discharged home on Levaquin with inhaler.  She is noted to have oxygen saturations of approximately 86% at that time and had improved to 93% after breathing treatment.  She has had chest x-ray along with CT angiogram here with no acute findings of PE, volume overload, pneumonia, or otherwise.  She has been placed on low-dose prednisone 40 mg daily and appears to require 3 L nasal cannula with ambulation.  It appears that she may have had some slow progression of her COPD and will now require home oxygen.  She has been counseled extensively on smoking cessation.  She will be discharged with prednisone 40 mg daily for the next 5 days and will need follow-up to her PCP in the next 1 to 2 weeks.  She will likely require outpatient spirometry and pulmonology appointment in the near future.  No other acute events noted during the course of this brief admission.   Discharge Diagnoses:  Active Problems:   Acute respiratory failure  [facility])    Discharge Instructions  Discharge Instructions    Diet - low sodium heart healthy   Complete by:  As directed    Increase activity slowly   Complete by:  As directed      Allergies as of 04/14/2018      Reactions   Penicillins Rash   Has patient had a PCN reaction causing immediate rash, facial/tongue/throat swelling, SOB or lightheadedness with hypotension:No Has patient had a PCN reaction causing severe rash involving mucus membranes or skin necrosis:Yes Has patient had a PCN reaction that required hospitalization:No Has patient had a PCN reaction occurring within the last 10 years:No If all of the above answers are "NO", then may proceed with Cephalosporin use.      Medication List    TAKE these medications   albuterol 108 (90 Base) MCG/ACT inhaler Commonly known as:  PROVENTIL HFA;VENTOLIN HFA Inhale 2 puffs into the lungs QID.   ALPRAZolam 0.5 MG tablet Commonly known as:  XANAX TAKE ONE TABLET TWICE DAILY AS NEEDED What changed:  See the new instructions.   amLODipine 5 MG tablet Commonly known as:  NORVASC TAKE ONE (1) TABLET EACH DAY What changed:  See the new instructions.   aspirin EC 81 MG tablet Take 81 mg by mouth daily.   citalopram 20 MG tablet Commonly known as:  CELEXA Take 1 tablet (20 mg total) by mouth daily.   levofloxacin 500 MG tablet Commonly known as:  LEVAQUIN Take 1 tablet (500 mg total) by mouth daily.   meloxicam 7.5  MG tablet Commonly known as:  MOBIC Take 7.5 mg by mouth daily as needed for pain.   polyethylene glycol packet Commonly known as:  MIRALAX / GLYCOLAX Take 17 g by mouth daily as needed for moderate constipation.   predniSONE 20 MG tablet Commonly known as:  DELTASONE Take 2 tablets (40 mg total) by mouth daily with breakfast for 5 days. Start taking on:  04/15/2018   senna 8.6 MG Tabs tablet Commonly known as:  SENOKOT Take 1 tablet (8.6 mg total) by mouth daily. What changed:  when to take this             Durable Medical Equipment  (From admission, onward)         Start     Ordered   04/14/18 0917  DME Oxygen  Once    Question Answer Comment  Mode or (Route) Nasal cannula   Liters per Minute 3   Frequency Continuous (stationary and portable oxygen unit needed)   Oxygen conserving device Yes   Oxygen delivery system Gas      04/14/18 0917         Follow-up Information    Terald Sleeper, PA-C Follow up in 1 week(s).   Specialties:  Physician Assistant, Family Medicine Contact information: [address] [place] [address] [phone]          Allergies  Allergen Reactions  . Penicillins Rash    Has patient had a PCN reaction causing immediate rash, facial/tongue/throat swelling, SOB or lightheadedness with hypotension:No Has patient had a PCN reaction causing severe rash involving mucus membranes or skin necrosis:Yes Has patient had a PCN reaction that required hospitalization:No Has patient had a PCN reaction occurring within the last 10 years:No If all of the above answers are "NO", then may proceed with Cephalosporin use.     Consultations:  None   Procedures/Studies: Dg Chest 2 View  Result Date: 04/13/2018 CLINICAL DATA:  Shortness of breath. EXAM: CHEST - 2 VIEW COMPARISON:  Radiographs of April 08, 2018. FINDINGS: The heart size and mediastinal contours are within normal limits. Both lungs are clear. Atherosclerosis of thoracic aorta is noted. Hyperexpansion of the lungs is noted. The visualized skeletal structures are unremarkable. IMPRESSION: Findings consistent with chronic obstructive pulmonary disease. No active cardiopulmonary disease. Aortic Atherosclerosis (ICD10-I70.0). Electronically Signed   By: Marijo Conception, M.D.   On: 04/13/2018 19:28   Dg Chest 2 View  Result Date: 04/08/2018 CLINICAL DATA:  Shortness of breath, smoker EXAM: CHEST - 2 VIEW COMPARISON:  07/12/2014 FINDINGS: There is hyperinflation of the lungs compatible  with COPD. Biapical scarring. No confluent airspace opacities or effusions. Heart is normal size. No acute bony abnormality. IMPRESSION: COPD/chronic changes.  No active disease. Electronically Signed   By: Rolm Baptise M.D.   On: 04/08/2018 09:53   Ct Angio Chest Pe W Or Wo Contrast  Result Date: 04/13/2018 CLINICAL DATA:  Hypoxia and dyspnea. EXAM: CT ANGIOGRAPHY CHEST WITH CONTRAST TECHNIQUE: Multidetector CT imaging of the chest was performed using the standard protocol during bolus administration of intravenous contrast. Multiplanar CT image reconstructions and MIPs were obtained to evaluate the vascular anatomy. CONTRAST:  148mL ISOVUE-370 IOPAMIDOL (ISOVUE-370) INJECTION 76% COMPARISON:  CXR 04/13/2018 and CT report from 06/13/2002 FINDINGS: Cardiovascular: Satisfactory opacification of the pulmonary arteries to the segmental level. No evidence of pulmonary embolism. Normal heart size. No pericardial effusion. Left main and three-vessel coronary arteriosclerosis. Ectatic ascending thoracic aorta to 3.8 cm. Moderate aortic atherosclerosis at  the arch and along the descending portion. No dissection. Mediastinum/Nodes: No enlarged mediastinal, hilar, or axillary lymph nodes. Thyroid gland, trachea, and esophagus demonstrate no significant findings. Lungs/Pleura: Azygos lobe incidentally nodes. Centrilobular and paraseptal emphysema. No pulmonary consolidation or dominant mass. No effusion or pneumothorax. Upper Abdomen: Atrophic pancreas with mild ectasia of the pancreatic duct without inflammation or mass. No enhancing mass lesions of the included liver. No splenomegaly. Aortic atherosclerosis. Patent branch vessels the abdomen to the level of the renal artery Musculoskeletal: Kyphosis of the thoracic spine with multilevel degenerative disc disease and endplate sclerosis of the lower thoracic vertebrae. Review of the MIP images confirms the above findings. IMPRESSION: 1. Left main and three-vessel coronary  arteriosclerosis. 2. Ectatic ascending thoracic aorta to 3.8 cm without dissection or aneurysm. 3. No acute pulmonary embolus to the segmental level. 4. Centrilobular and paraseptal emphysema. No active pulmonary disease. Aortic Atherosclerosis (ICD10-I70.0) and Emphysema (ICD10-J43.9). Electronically Signed   By: Ashley Royalty M.D.   On: 04/13/2018 23:19     Discharge Exam: Vitals:   04/14/18 0848 04/14/18 0849  BP:    Pulse:    Resp:    Temp:    SpO2: (!) 84% (!) 89%   Vitals:   04/14/18 0835 04/14/18 0846 04/14/18 0848 04/14/18 0849  BP: (!) 105/59     Pulse: 89     Resp:      Temp:      TempSrc:      SpO2: 91% 90% (!) 84% (!) 89%  Weight:      Height:        General: Pt is alert, awake, not in acute distress Cardiovascular: RRR, S1/S2 +, no rubs, no gallops Respiratory: CTA bilaterally, no wheezing, no rhonchi Abdominal: Soft, NT, ND, bowel sounds + Extremities: no edema, no cyanosis    The results of significant diagnostics from this hospitalization (including imaging, microbiology, ancillary and laboratory) are listed below for reference.     Microbiology: No results found for this or any previous visit (from the past 240 hour(s)).   Labs: BNP (last 3 results) No results for input(s): BNP in the last 8760 hours. Basic Metabolic Panel: Recent Labs  Lab 04/13/18 1949 04/14/18 0510  NA 130* 133*  K 4.5 4.4  CL 95* 97*  CO2 32 27  GLUCOSE 128* 164*  BUN 15 14  CREATININE 1.16* 1.04*  CALCIUM 8.9 8.9   Liver Function Tests: Recent Labs  Lab 04/14/18 0510  AST 15  ALT 11  ALKPHOS 71  BILITOT 0.8  PROT 6.3*  ALBUMIN 3.6   No results for input(s): LIPASE, AMYLASE in the last 168 hours. No results for input(s): AMMONIA in the last 168 hours. CBC: Recent Labs  Lab 04/13/18 1949 04/14/18 0510  WBC 10.1 6.7  NEUTROABS 8.1*  --   HGB 13.4 11.9*  HCT 40.2 36.6  MCV 93.1 92.9  PLT 240 208   Cardiac Enzymes: Recent Labs  Lab 04/13/18 1949   TROPONINI <0.03   BNP: Invalid input(s): POCBNP CBG: No results for input(s): GLUCAP in the last 168 hours. D-Dimer No results for input(s): DDIMER in the last 72 hours. Hgb A1c No results for input(s): HGBA1C in the last 72 hours. Lipid Profile No results for input(s): CHOL, HDL, LDLCALC, TRIG, CHOLHDL, LDLDIRECT in the last 72 hours. Thyroid function studies No results for input(s): TSH, T4TOTAL, T3FREE, THYROIDAB in the last 72 hours.  Invalid input(s): FREET3 Anemia work up No results for input(s): VITAMINB12, FOLATE, FERRITIN, TIBC,  IRON, RETICCTPCT in the last 72 hours. Urinalysis    Component Value Date/Time   COLORURINE STRAW (A) 06/23/2017 0830   APPEARANCEUR Clear 02/11/2018 1148   LABSPEC 1.003 (L) 06/23/2017 0830   PHURINE 6.0 06/23/2017 0830   GLUCOSEU Negative 02/11/2018 1148   HGBUR NEGATIVE 06/23/2017 0830   BILIRUBINUR Negative 02/11/2018 1148   KETONESUR NEGATIVE 06/23/2017 0830   PROTEINUR Negative 02/11/2018 1148   PROTEINUR NEGATIVE 06/23/2017 0830   NITRITE Negative 02/11/2018 1148   NITRITE NEGATIVE 06/23/2017 0830   LEUKOCYTESUR Trace (A) 02/11/2018 1148   Sepsis Labs Invalid input(s): PROCALCITONIN,  WBC,  LACTICIDVEN Microbiology No results found for this or any previous visit (from the past 240 hour(s)).   Time coordinating discharge: 35 minutes  SIGNED:   Rodena Goldmann, DO [facility] 04/14/2018, 9:18 AM Pager [phone]  If 7PM-7AM, please contact night-coverage [email or web address] Password [email or web address]

## 2018-04-14 NOTE — Care Management Obs Status (Signed)
[place] NOTIFICATION   Patient Details  Name: Kelly Peters MRN: [id number] Date of Birth: 05/04/1930   Medicare Observation Status Notification Given:  Yes    Alioune Hodgkin, Chauncey Reading, RN 04/14/2018, 7:46 AM

## 2018-04-14 NOTE — Progress Notes (Signed)
Walked patient to bathroom without O2, sats dropped to 82%. Patient denied being SOB, respirations even and non-labored. Placed O2 back on at 2 liters, sats up to 92% within 3 mins. Will continue to monitor.

## 2018-04-14 NOTE — Progress Notes (Signed)
Patients BP 86/76, denies any symptoms or dizziness. Notified Dr. Darrick Meigs to make aware, Norvasc dc'd, stated to re-check BP in 1 hr and continue fluids.

## 2018-04-15 ENCOUNTER — Ambulatory Visit: Payer: Medicare Other | Admitting: Physician Assistant

## 2018-04-21 ENCOUNTER — Ambulatory Visit (INDEPENDENT_AMBULATORY_CARE_PROVIDER_SITE_OTHER): Payer: Medicare Other | Admitting: Family Medicine

## 2018-04-21 ENCOUNTER — Encounter: Payer: Self-pay | Admitting: Family Medicine

## 2018-04-21 VITALS — BP 130/78 | HR 89 | Temp 97.9°F | Ht 61.0 in | Wt 102.0 lb

## 2018-04-21 DIAGNOSIS — Z09 Encounter for follow-up examination after completed treatment for conditions other than malignant neoplasm: Secondary | ICD-10-CM

## 2018-04-21 DIAGNOSIS — Z23 Encounter for immunization: Secondary | ICD-10-CM | POA: Diagnosis not present

## 2018-04-21 DIAGNOSIS — R0902 Hypoxemia: Secondary | ICD-10-CM

## 2018-04-21 DIAGNOSIS — F17200 Nicotine dependence, unspecified, uncomplicated: Secondary | ICD-10-CM | POA: Insufficient documentation

## 2018-04-21 DIAGNOSIS — Z9981 Dependence on supplemental oxygen: Secondary | ICD-10-CM

## 2018-04-21 DIAGNOSIS — J449 Chronic obstructive pulmonary disease, unspecified: Secondary | ICD-10-CM | POA: Diagnosis not present

## 2018-04-21 NOTE — Progress Notes (Signed)
Subjective: CC: Hospital follow up PCP: Terald Sleeper, PA-C ZOX:[id number] Kelly Peters is a 84 y.o. female presenting to clinic today for:  1. COPD exacerbation Patient recently hospitalized for COPD exacerbation on 04/13/2018.  She was discharged after 1 day.  She was recommended to follow-up with PCP in 1 to 2 weeks.  On admission, she was noted to be hypoxic into the mid 80s.  She had an x-ray and CT angiogram with no acute findings of PE fluid overload or pneumonia.  She was placed on low-dose prednisone and 3 L of oxygen via nasal cannula.  She was discharged with home oxygen 3 L nasal cannula and counseled heavily on smoking cessation.   She notes that she has been a 1 pack/day smoker for over 50 years.  Denies any hemoptysis, shortness of breath or wheeze.  She has not noticed a great change in her breathing with use of oxygen.  She does report quite a bit of annoyance with having to use the supplemental oxygen.  Denies any fevers, chest pain.  She does feel like her heart occasionally races.  ROS: Per HPI  Allergies  Allergen Reactions  . Penicillins Rash    Has patient had a PCN reaction causing immediate rash, facial/tongue/throat swelling, SOB or lightheadedness with hypotension:No Has patient had a PCN reaction causing severe rash involving mucus membranes or skin necrosis:Yes Has patient had a PCN reaction that required hospitalization:No Has patient had a PCN reaction occurring within the last 10 years:No If all of the above answers are "NO", then may proceed with Cephalosporin use.    Past Medical History:  Diagnosis Date  . Hypertension     Current Outpatient Medications:  .  albuterol (PROVENTIL HFA;VENTOLIN HFA) 108 (90 Base) MCG/ACT inhaler, Inhale 2 puffs into the lungs QID., Disp: 1 Inhaler, Rfl: 3 .  ALPRAZolam (XANAX) 0.5 MG tablet, TAKE ONE TABLET TWICE DAILY AS NEEDED (Patient taking differently: Take 0.5 mg by mouth 2 (two) times daily as needed for anxiety. ),  Disp: 180 tablet, Rfl: 0 .  amLODipine (NORVASC) 5 MG tablet, TAKE ONE (1) TABLET EACH DAY (Patient taking differently: Take 5 mg by mouth daily. ), Disp: 90 tablet, Rfl: 0 .  aspirin EC 81 MG tablet, Take 81 mg by mouth daily., Disp: , Rfl:  .  citalopram (CELEXA) 20 MG tablet, Take 1 tablet (20 mg total) by mouth daily., Disp: 30 tablet, Rfl: 3 .  meloxicam (MOBIC) 7.5 MG tablet, Take 7.5 mg by mouth daily as needed for pain., Disp: , Rfl:  .  polyethylene glycol (MIRALAX / GLYCOLAX) packet, Take 17 g by mouth daily as needed for moderate constipation. , Disp: , Rfl:  .  senna (SENOKOT) 8.6 MG TABS tablet, Take 1 tablet (8.6 mg total) by mouth daily. (Patient taking differently: Take 1 tablet by mouth at bedtime. ), Disp: 120 each, Rfl: 0 Social History   Socioeconomic History  . Marital status: Widowed    Spouse name: Not on file  . Number of children: Not on file  . Years of education: Not on file  . Highest education level: Not on file  Occupational History  . Not on file  Social Needs  . Financial resource strain: Not on file  . Food insecurity:    Worry: Not on file    Inability: Not on file  . Transportation needs:    Medical: Not on file    Non-medical: Not on file  Tobacco Use  . Smoking  status: Current Every Day Smoker    Packs/day: 1.00    Years: 50.00    Pack years: 50.00  . Smokeless tobacco: Never Used  Substance and Sexual Activity  . Alcohol use: No  . Drug use: No  . Sexual activity: Not on file  Lifestyle  . Physical activity:    Days per week: Not on file    Minutes per session: Not on file  . Stress: Not on file  Relationships  . Social connections:    Talks on phone: Not on file    Gets together: Not on file    Attends religious service: Not on file    Active member of club or organization: Not on file    Attends meetings of clubs or organizations: Not on file    Relationship status: Not on file  . Intimate partner violence:    Fear of current or  ex partner: Not on file    Emotionally abused: Not on file    Physically abused: Not on file    Forced sexual activity: Not on file  Other Topics Concern  . Not on file  Social History Narrative  . Not on file   No family history on file.  Objective: Office vital signs reviewed. BP 130/78   Pulse 89   Temp 97.9 F (36.6 C) (Oral)   Ht 5\' 1"  (1.549 m)   Wt 102 lb (46.3 kg)   SpO2 97%   BMI 19.27 kg/m   Physical Examination:  General: Awake, alert, thin elderly female. No acute distress HEENT: Normal, MMM, no LAD Cardio: regular rate and rhythm, S1S2 heard, no murmurs appreciated Pulm: Globally decreased breath sounds but seemingly clear to auscultation bilaterally, no wheezes, rhonchi or rales; normal work of breathing on 3L O2. Extremities: no edema.  Ambulatory O2/ vitals from 9/25: Patient Saturations on Room Air at Rest = 90% Patient Saturations on Room Air while Ambulating = 84% Patient Saturations on 3 Liters of oxygen while Ambulating = 88-90%  Assessment/ Plan: 84 y.o. female   1. Chronic obstructive pulmonary disease, unspecified COPD type ([place]) No formal lung function tests available.  That she would benefit from formal lung testing.  I placed a referral to pulmonology.  Of also given her a written prescription for oxygen concentrator.  She will likely benefit from controller inhaler but will hold off on this until she is evaluated by pulmonology so as not to interfere with any pulmonary function testing.  At this point, she is totally asymptomatic.  She has a rescue inhaler on hand if needed. - Ambulatory referral to Pulmonology - For home use only DME oxygen  2. Hospital discharge follow-up I reviewed her hospital discharge summary, imaging reports and lab results. - Ambulatory referral to Pulmonology - For home use only DME oxygen  3. Hypoxemia requiring supplemental oxygen - For home use only DME oxygen  4. Need for influenza vaccination Influenza  vaccine administered during today's visit.    Orders Placed This Encounter  Procedures  . For home use only DME oxygen    Dx R09.002; J44.9  Patient Saturations on Room Air at Rest = 90%  Patient Saturations on Room Air while Ambulating = 84%  Patient Saturations on 3 Liters of oxygen while Ambulating = 88-90%    Order Specific Question:   Liters per Minute    Answer:   3    Order Specific Question:   Oxygen delivery system    Answer:   Gas  .  Ambulatory referral to Pulmonology    Referral Priority:   Routine    Referral Type:   Consultation    Referral Reason:   Specialty Services Required    Requested Specialty:   Pulmonary Disease    Number of Visits Requested:   [place], [place] [phone]

## 2018-04-21 NOTE — Patient Instructions (Signed)

## 2018-05-12 ENCOUNTER — Encounter: Payer: Self-pay | Admitting: Pulmonary Disease

## 2018-05-12 ENCOUNTER — Ambulatory Visit (INDEPENDENT_AMBULATORY_CARE_PROVIDER_SITE_OTHER): Payer: Medicare Other | Admitting: Pulmonary Disease

## 2018-05-12 VITALS — BP 144/62 | HR 73 | Ht 59.5 in | Wt 105.4 lb

## 2018-05-12 DIAGNOSIS — J9611 Chronic respiratory failure with hypoxia: Secondary | ICD-10-CM | POA: Diagnosis not present

## 2018-05-12 DIAGNOSIS — Z720 Tobacco use: Secondary | ICD-10-CM

## 2018-05-12 DIAGNOSIS — J432 Centrilobular emphysema: Secondary | ICD-10-CM | POA: Diagnosis not present

## 2018-05-12 IMAGING — CT CT ABD-PELV W/ CM
2 of 5 series · 16 of 46 positions shown, 18 images · IV contrast (Isovue)
Comparison: None.

CLINICAL DATA: Abdominal pain with diverticulitis suspected.

EXAM:
CT ABDOMEN AND PELVIS WITH CONTRAST
TECHNIQUE: Multidetector CT imaging of the abdomen and pelvis was performed
using the standard protocol following bolus administration of
intravenous contrast.
CONTRAST:  100mL ATG0FP-R00 IOPAMIDOL (ATG0FP-R00) INJECTION 61%

[Series 2: axial st · axial · 0.66mm/px · z∈[+956,+1256]mm · 13 of 68 slices shown, 15 images]
[im 4/68  soft-tissue]
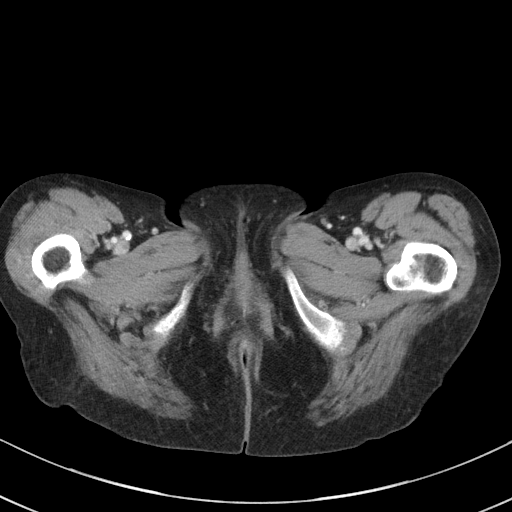
[im 4/68  bone]
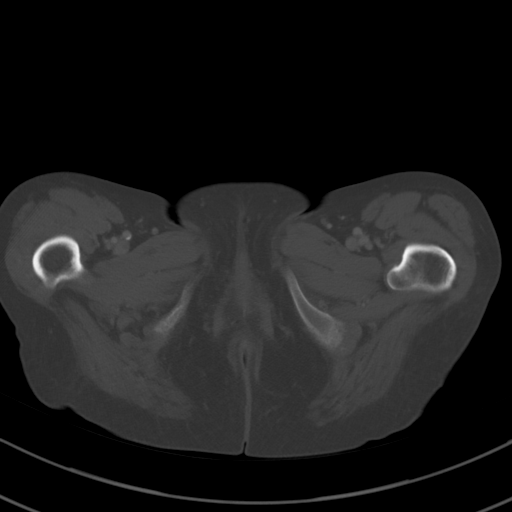
[im 8/68  soft-tissue]
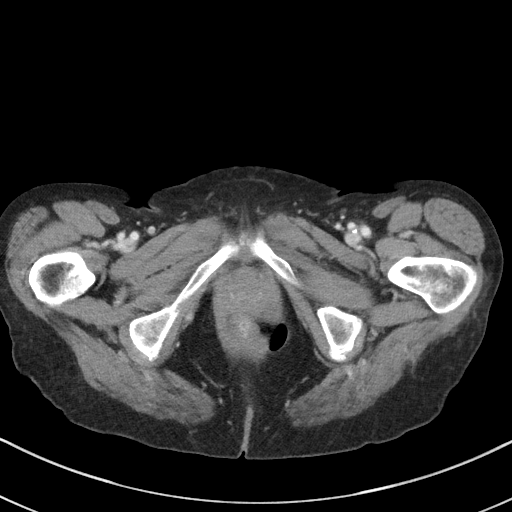
[im 16/68  soft-tissue]
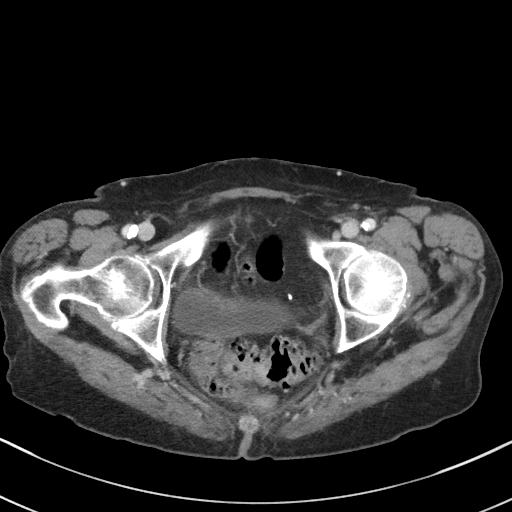
[im 20/68  soft-tissue]
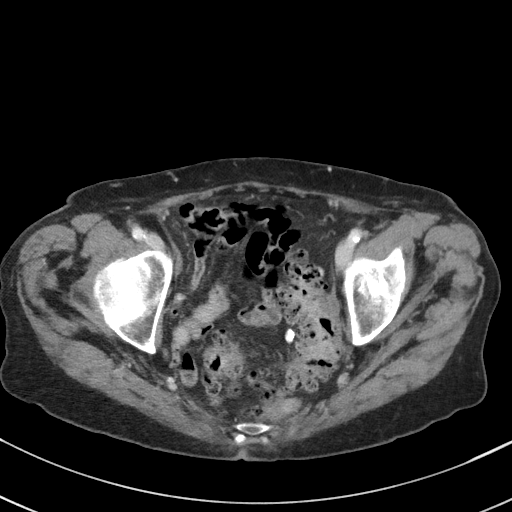
[im 24/68  soft-tissue]
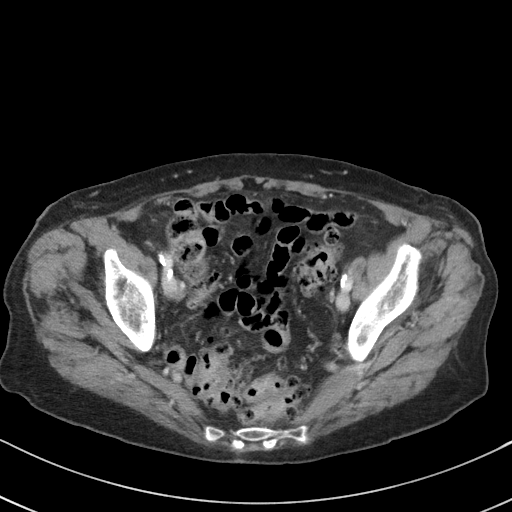
[im 28/68  soft-tissue]
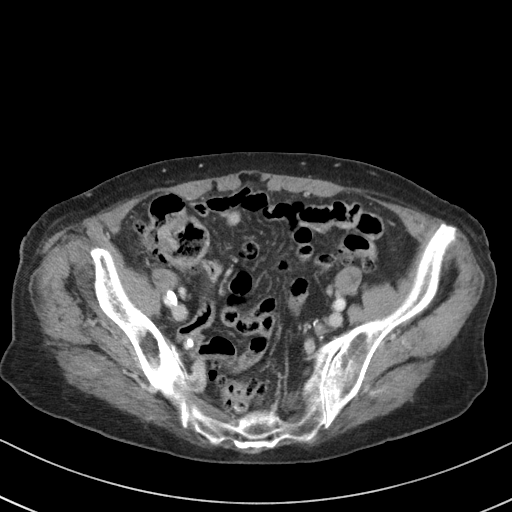
[im 36/68  soft-tissue]
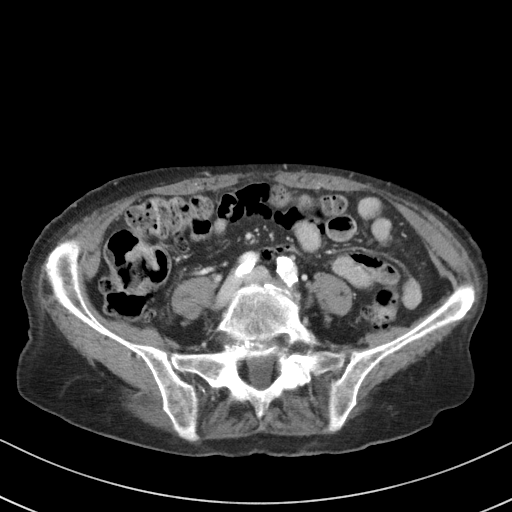
[im 40/68  soft-tissue]
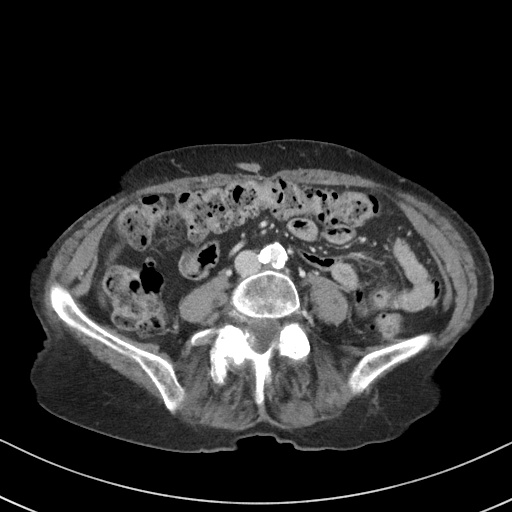
[im 44/68  soft-tissue]
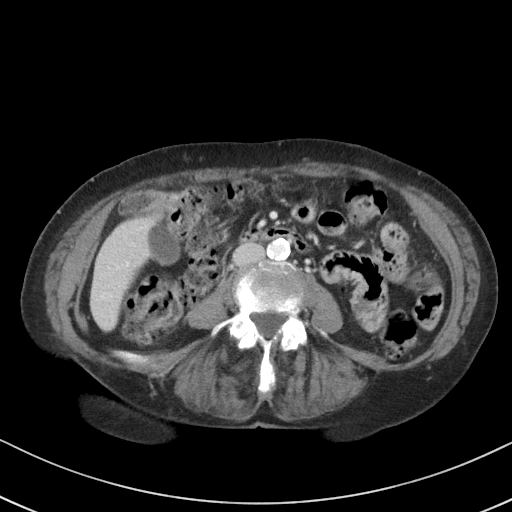
[im 44/68  bone]
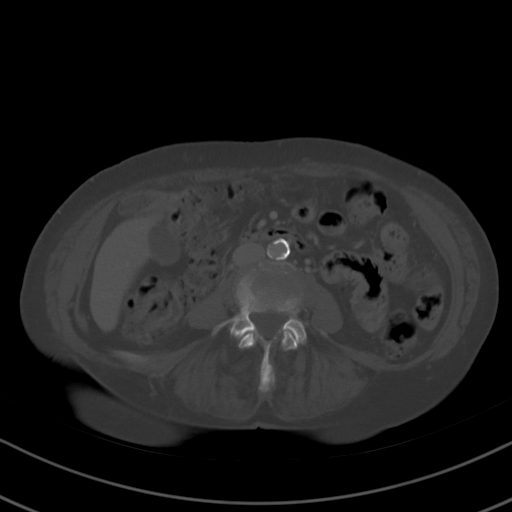
[im 48/68  soft-tissue]
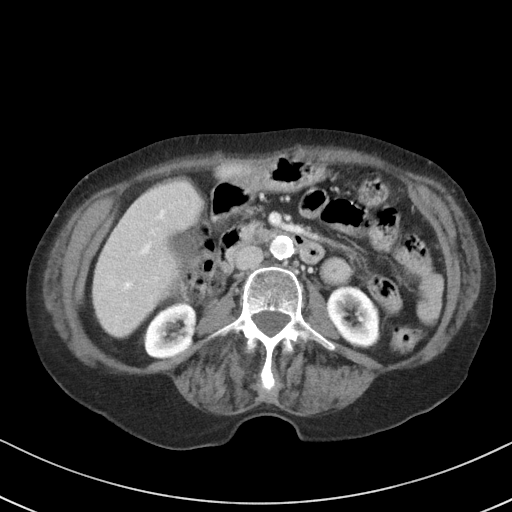
[im 52/68  soft-tissue]
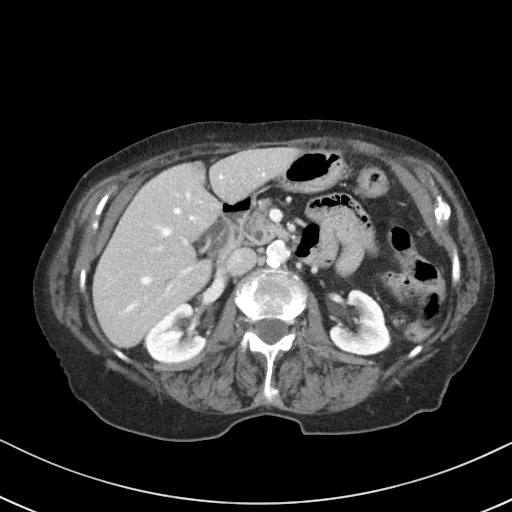
[im 60/68  soft-tissue]
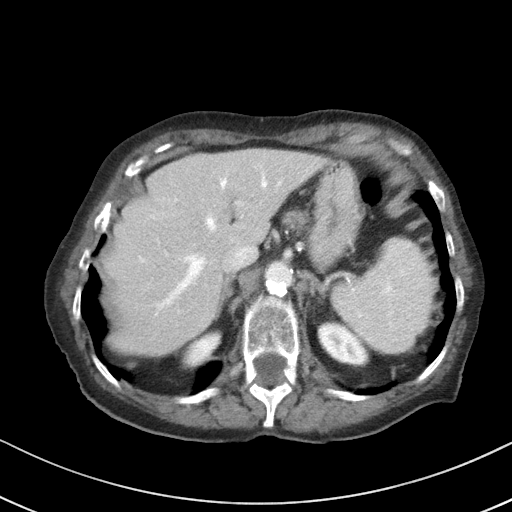
[im 64/68  soft-tissue]
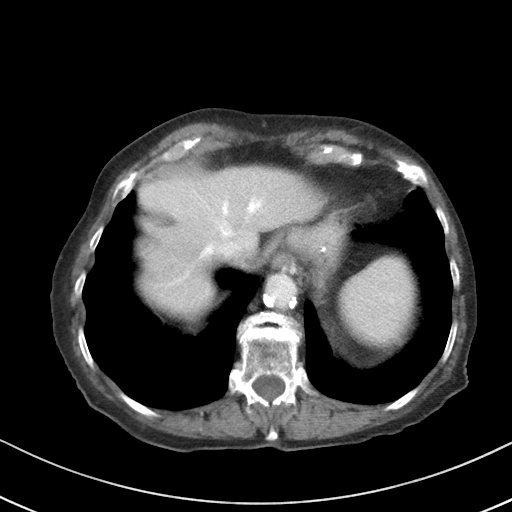

[Series 4: coronal st · coronal · 0.63mm/px · 3 of 77 slices shown]
[im 26/77  soft-tissue]
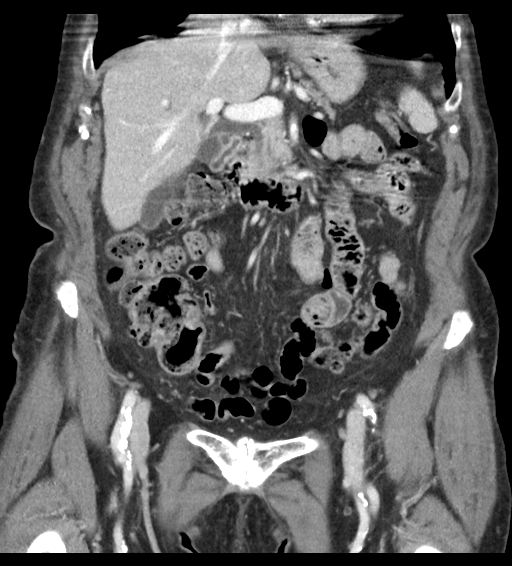
[im 34/77  soft-tissue]
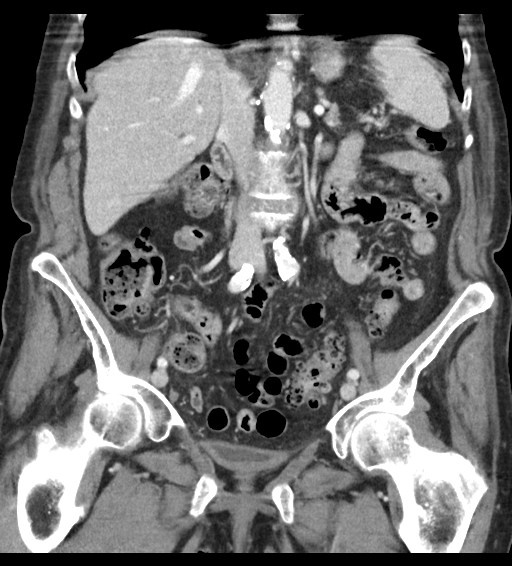
[im 43/77  soft-tissue]
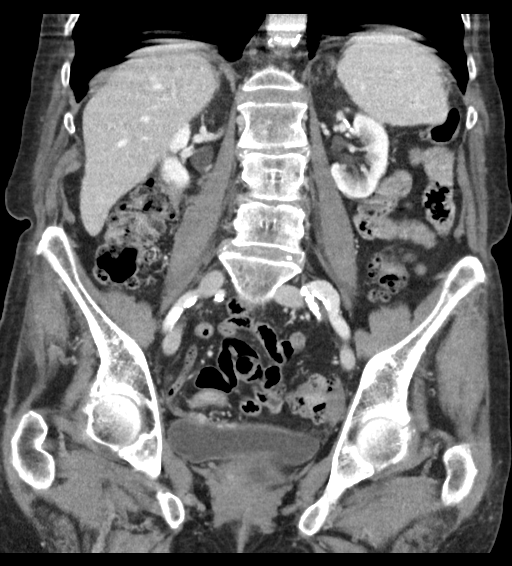

[16 of 46 positions shown; findings below may reference images not displayed]

FINDINGS: Lower chest: Atherosclerotic calcifications seen in the RCA. No
acute finding.

Hepatobiliary: No focal liver abnormality.No evidence of biliary
obstruction or stone.

Pancreas: No acute or aggressive finding.  Generalized atrophy.

Spleen: Unremarkable.

Adrenals/Urinary Tract: Negative adrenals. No hydronephrosis or
stone. Mild symmetric bilateral renal atrophy. Unremarkable bladder.

Stomach/Bowel: No obstruction. No appendicitis. Advanced
diverticulosis of the colon, especially at the sigmoid. No active
inflammation is seen.

Vascular/Lymphatic: Advanced atherosclerosis. Separate origins of
the common hepatic and splenic arteries that are both atheromatous
and narrowed. Atherosclerosis of the patent SMA with likely moderate
narrowing proximally. Bulky plaque at the aortic bifurcation and
proximal iliacs with bilateral proximal iliac stenosis. No mass or
adenopathy.

Reproductive:  Hysterectomy.  Negative adnexae.

Other: No ascites or pneumoperitoneum. Small fatty left inguinal
hernia.

Musculoskeletal: No acute abnormalities. Advanced lumbar facet
arthropathy with slight anterolisthesis at L5-S1. Advanced lower
thoracic degenerative disc narrowing with endplate sclerosis.
IMPRESSION: 1. No acute finding.
2. Extensive colonic diverticulosis without visible diverticulitis.
3. Advanced atherosclerosis with visceral and bilateral iliac origin
stenoses.
4. Small fatty left inguinal hernia.

## 2018-05-12 MED ORDER — ALBUTEROL SULFATE HFA 108 (90 BASE) MCG/ACT IN AERS: 2.0000 | INHALATION_SPRAY | Freq: Four times a day (QID) | RESPIRATORY_TRACT | 3 refills | Status: DC

## 2018-05-12 NOTE — Patient Instructions (Addendum)
Emphysema/COPD We will order breathing test (full pulmonary function test) before you next visit in 3 months. CONTINUE albuterol every 4-6 hours as needed for shortness of breath or wheezing.   If you experience worsening shortness of breath or wheezing that does not improve, please seek medical attention immediately.

## 2018-05-12 NOTE — Progress Notes (Signed)
Synopsis: Referred in 04/2018 for emphysema  Subjective:   PATIENT ID: Kelly Peters GENDER: female DOB: [birth year], MRN: [id number]   HPI  Chief Complaint  Patient presents with  . CONSULT    went to PCP for chest cold sx / course of antibiotics .  went home and a few days later had to go to ED for weakness and prod cough.  Has home O2 but doesn't feel she uses it. States she never feels SOB.   [name] is a 84 year old female with history of active tobacco abuse and presumed COPD (no PFTs on file) who presents as new consult for COPD management.  Per chart review she was recently hospitalized for COPD exacerbation from 9/24 to 9/25.  She presented with hypoxemia and treated with Solu-Medrol x1 dose followed by p.o. prednisone, bronchodilators and Levaquin.  CTA was negative for PE however did demonstrate emphysematous changes.  Discharged on home O2 and scheduled to follow-up with PCP who referred to Pulmonary.  Today in clinic she reports that her coughing and wheezing have improved and resolved with steroids and antibiotics.  She has never had any dyspnea and states that she was only sent to the doctor when she noted hypoxemia on a pulse oximeter at home.  She denies chronic cough or wheezing.  She uses albuterol as needed which seems to improve the symptoms when they arise.  States that at baseline she is not very active but has not had any recent limitations in regular activities including grocery shopping and ambulating within the house.  Denies any recent fevers, chills, cough, chest pain.  She states that it has been many years since she had an episode of bronchitis and believes this episode of dyspnea was related to a recent cold she got from family.  Active smoker. Currently <1/4pppd Average packs/day: 1 pack/day x 50 packs Environmental exposures: Denied  I have personally reviewed patient's past medical/family/social history, allergies, current medications.  Past  Medical History:  Diagnosis Date  . Hypertension      Family History  Problem Relation Age of Onset  . Stroke Mother   . Heart attack Father      Social History   Socioeconomic History  . Marital status: Widowed    Spouse name: Not on file  . Number of children: Not on file  . Years of education: Not on file  . Highest education level: Not on file  Occupational History  . Not on file  Social Needs  . Financial resource strain: Not on file  . Food insecurity:    Worry: Not on file    Inability: Not on file  . Transportation needs:    Medical: Not on file    Non-medical: Not on file  Tobacco Use  . Smoking status: Current Every Day Smoker    Packs/day: 0.25    Years: 50.00    Pack years: 12.50  . Smokeless tobacco: Never Used  . Tobacco comment: down to 4-5 cigarettes a day 05/12/18  Substance and Sexual Activity  . Alcohol use: No  . Drug use: No  . Sexual activity: Not on file  Lifestyle  . Physical activity:    Days per week: Not on file    Minutes per session: Not on file  . Stress: Not on file  Relationships  . Social connections:    Talks on phone: Not on file    Gets together: Not on file    Attends religious service: Not on  file    Active member of club or organization: Not on file    Attends meetings of clubs or organizations: Not on file    Relationship status: Not on file  . Intimate partner violence:    Fear of current or ex partner: Not on file    Emotionally abused: Not on file    Physically abused: Not on file    Forced sexual activity: Not on file  Other Topics Concern  . Not on file  Social History Narrative  . Not on file     Allergies  Allergen Reactions  . Penicillins Rash    Has patient had a PCN reaction causing immediate rash, facial/tongue/throat swelling, SOB or lightheadedness with hypotension:No Has patient had a PCN reaction causing severe rash involving mucus membranes or skin necrosis:Yes Has patient had a PCN reaction  that required hospitalization:No Has patient had a PCN reaction occurring within the last 10 years:No If all of the above answers are "NO", then may proceed with Cephalosporin use.      Outpatient Medications Prior to Visit  Medication Sig Dispense Refill  . ALPRAZolam (XANAX) 0.5 MG tablet TAKE ONE TABLET TWICE DAILY AS NEEDED (Patient taking differently: Take 0.5 mg by mouth 2 (two) times daily as needed for anxiety. ) 180 tablet 0  . amLODipine (NORVASC) 5 MG tablet TAKE ONE (1) TABLET EACH DAY (Patient taking differently: Take 5 mg by mouth daily. ) 90 tablet 0  . aspirin EC 81 MG tablet Take 81 mg by mouth daily.    . citalopram (CELEXA) 20 MG tablet Take 1 tablet (20 mg total) by mouth daily. 30 tablet 3  . polyethylene glycol (MIRALAX / GLYCOLAX) packet Take 17 g by mouth daily as needed for moderate constipation.     . senna (SENOKOT) 8.6 MG TABS tablet Take 1 tablet (8.6 mg total) by mouth daily. (Patient taking differently: Take 1 tablet by mouth at bedtime. ) 120 each 0  . albuterol (PROVENTIL HFA;VENTOLIN HFA) 108 (90 Base) MCG/ACT inhaler Inhale 2 puffs into the lungs QID. 1 Inhaler 3  . meloxicam (MOBIC) 7.5 MG tablet Take 7.5 mg by mouth daily as needed for pain.     No facility-administered medications prior to visit.     Review of Systems  Constitutional: Negative for chills, diaphoresis, fever, malaise/fatigue and weight loss.  HENT: Negative for congestion and sore throat.   Respiratory: Negative for cough, hemoptysis, sputum production, shortness of breath and wheezing.   Cardiovascular: Negative for chest pain, orthopnea, leg swelling and PND.  Gastrointestinal: Negative for abdominal pain, heartburn and nausea.  Musculoskeletal: Negative for myalgias.  Skin: Negative for rash.  Neurological: Negative for dizziness, weakness and headaches.  Endo/Heme/Allergies: Does not bruise/bleed easily.  Psychiatric/Behavioral: Negative for depression. The patient is not  nervous/anxious.   All other systems reviewed and are negative.     Objective:  Physical Exam   Vitals:   05/12/18 1108  BP: (!) 144/62  Pulse: 73  SpO2: 95%  Weight: 105 lb 6.4 oz (47.8 kg)  Height: 4' 11.5" (1.511 m)   Gen: well appearing, no acute distress HENT: NCAT, OP clear, neck supple without masses Eyes: PERRL, EOMi Lymph: no cervical lymphadenopathy PULM: CTA B CV: RRR, no mgr, no JVD GI: BS+, soft, nontender Derm: no rash or skin breakdown MSK: normal bulk and tone Neuro: A&Ox4, CN II-XII intact, strength 5/5 in all 4 extremities Psyche: normal mood and affect   CBC    Component Value  Date/Time   WBC 6.7 04/14/2018 0510   RBC 3.94 04/14/2018 0510   HGB 11.9 (L) 04/14/2018 0510   HGB 12.9 02/11/2018 1218   HCT 36.6 04/14/2018 0510   HCT 39.5 02/11/2018 1218   PLT 208 04/14/2018 0510   PLT 188 02/11/2018 1218   MCV 92.9 04/14/2018 0510   MCV 92 02/11/2018 1218   MCH 30.2 04/14/2018 0510   MCHC 32.5 04/14/2018 0510   RDW 13.0 04/14/2018 0510   RDW 13.8 02/11/2018 1218   LYMPHSABS 1.1 04/13/2018 1949   LYMPHSABS 1.1 02/11/2018 1218   MONOABS 0.8 04/13/2018 1949   EOSABS 0.0 04/13/2018 1949   EOSABS 0.1 02/11/2018 1218   BASOSABS 0.0 04/13/2018 1949   BASOSABS 0.1 02/11/2018 1218     Chest imaging: CTA 04/13/18: No evidence of PE.  Emphysematous changes throughout lung fields.  No acute lung abnormalities  PFT: None on file  I have personally reviewed the above labs, images and tests noted above.    Assessment & Plan:   Centrilobular emphysema ([place]) - Plan: Pulmonary function test, albuterol (PROVENTIL HFA;VENTOLIN HFA) 108 (90 Base) MCG/ACT inhaler  Chronic hypoxemic respiratory failure (HCC)  Tobacco abuse disorder  Discussion: 84 year old female with active tobacco abuse and emphysema who presents as a new consult for COPD management.  She was discharged on oxygen after recent COPD exacerbation however in clinic ambulatory test  demonstrates no further oxygen needs.  Patient not an active exacerbation and reports symptom control with SABA.  Hold on adding additional bronchodilators at this time as her prior symptoms are likely related to acute infection has now resolved.  Plan to obtain PFTs at next visit and reevaluate symptoms.  Discussed smoking cessation.  Emphysema --We will obtain PFTs prior to your next visit in 3 months --CONTINUE albuterol inhaler every 4 to 6 hours as needed for shortness of breath or wheezing --Albuterol refilled.  Chronic hypoxemic respiratory failure, resolved Likely related to post infectious insult. In clinic ambulatory oxygen saturation measured: O2 saturations greater than 93% with rest and activity.  No oxygen requirement needed.  Tobacco abuse Patient is an active smoker.  She is not interested in quitting today. We discussed smoking cessation for <3 minutes. We discussed triggers and stressors and ways to deal with them. We discussed barriers to continued smoking and benefits of smoking cessation.  Vaccinations: Influenza vaccine up-to-date Pneumovax due. Patient declined  Thank you for choosing [facility] for your health needs!   Shaquia Berkley Rodman Pickle, MD [facility] 05/12/2018 12:05 PM  Personal pager: [phone] If unanswered, please page CCM On-call: [phone]    Current Outpatient Medications:  .  albuterol (PROVENTIL HFA;VENTOLIN HFA) 108 (90 Base) MCG/ACT inhaler, Inhale 2 puffs into the lungs QID., Disp: 1 Inhaler, Rfl: 3 .  ALPRAZolam (XANAX) 0.5 MG tablet, TAKE ONE TABLET TWICE DAILY AS NEEDED (Patient taking differently: Take 0.5 mg by mouth 2 (two) times daily as needed for anxiety. ), Disp: 180 tablet, Rfl: 0 .  amLODipine (NORVASC) 5 MG tablet, TAKE ONE (1) TABLET EACH DAY (Patient taking differently: Take 5 mg by mouth daily. ), Disp: 90 tablet, Rfl: 0 .  aspirin EC 81 MG tablet, Take 81 mg by mouth daily., Disp: , Rfl:  .   citalopram (CELEXA) 20 MG tablet, Take 1 tablet (20 mg total) by mouth daily., Disp: 30 tablet, Rfl: 3 .  polyethylene glycol (MIRALAX / GLYCOLAX) packet, Take 17 g by mouth daily as needed for moderate constipation. , Disp: ,  Rfl:  .  senna (SENOKOT) 8.6 MG TABS tablet, Take 1 tablet (8.6 mg total) by mouth daily. (Patient taking differently: Take 1 tablet by mouth at bedtime. ), Disp: 120 each, Rfl: 0 .  meloxicam (MOBIC) 7.5 MG tablet, Take 7.5 mg by mouth daily as needed for pain., Disp: , Rfl:

## 2018-05-19 ENCOUNTER — Encounter: Payer: Self-pay | Admitting: Physician Assistant

## 2018-05-19 ENCOUNTER — Ambulatory Visit (INDEPENDENT_AMBULATORY_CARE_PROVIDER_SITE_OTHER): Payer: Medicare Other | Admitting: Physician Assistant

## 2018-05-19 DIAGNOSIS — I10 Essential (primary) hypertension: Secondary | ICD-10-CM

## 2018-05-19 DIAGNOSIS — K5900 Constipation, unspecified: Secondary | ICD-10-CM

## 2018-05-19 DIAGNOSIS — F339 Major depressive disorder, recurrent, unspecified: Secondary | ICD-10-CM

## 2018-05-19 DIAGNOSIS — F419 Anxiety disorder, unspecified: Secondary | ICD-10-CM

## 2018-05-19 MED ORDER — CITALOPRAM HYDROBROMIDE 20 MG PO TABS: 20.0000 mg | ORAL_TABLET | Freq: Every day | ORAL | 3 refills | Status: DC

## 2018-05-19 MED ORDER — SENNA 8.6 MG PO TABS: 1.0000 | ORAL_TABLET | Freq: Every day | ORAL | Status: DC

## 2018-05-19 MED ORDER — ALPRAZOLAM 0.5 MG PO TABS: 0.5000 mg | ORAL_TABLET | Freq: Two times a day (BID) | ORAL | 5 refills | Status: DC | PRN

## 2018-05-19 MED ORDER — AMLODIPINE BESYLATE 5 MG PO TABS: 5.0000 mg | ORAL_TABLET | Freq: Every day | ORAL | 3 refills | Status: DC

## 2018-05-21 ENCOUNTER — Ambulatory Visit: Payer: Medicare Other | Admitting: Physician Assistant

## 2018-05-23 NOTE — Progress Notes (Signed)
BP 120/66   Pulse 77   Temp (!) 97.4 F (36.3 C) (Oral)   Ht 4' 11.5" (1.511 m)   Wt 106 lb 3.2 oz (48.2 kg)   BMI 21.09 kg/m    Subjective:    Patient ID: Kelly Peters, female    DOB: 11-Dec-1929, 84 y.o.   MRN: [id number]  HPI: Kelly Peters is a 84 y.o. female presenting on 05/19/2018 for Follow-up on breathing  This patient comes in for periodic recheck on medications and conditions including anxiety. Hypertension, depression, constipation. She reports that sh is doing better with all of her conditions.   All medications are reviewed today. There are no reports of any problems with the medications. All of the medical conditions are reviewed and updated.  Lab work is reviewed and will be ordered as medically necessary. There are no new problems reported with today's visit.   Past Medical History:  Diagnosis Date  . Hypertension    Relevant past medical, surgical, family and social history reviewed and updated as indicated. Interim medical history since our last visit reviewed. Allergies and medications reviewed and updated. DATA REVIEWED: CHART IN EPIC  Family History reviewed for pertinent findings.  Review of Systems  Constitutional: Negative.   HENT: Negative.   Eyes: Negative.   Respiratory: Negative.   Gastrointestinal: Negative.   Genitourinary: Negative.     Allergies as of 05/19/2018      Reactions   Penicillins Rash   Has patient had a PCN reaction causing immediate rash, facial/tongue/throat swelling, SOB or lightheadedness with hypotension:No Has patient had a PCN reaction causing severe rash involving mucus membranes or skin necrosis:Yes Has patient had a PCN reaction that required hospitalization:No Has patient had a PCN reaction occurring within the last 10 years:No If all of the above answers are "NO", then may proceed with Cephalosporin use.      Medication List        Accurate as of 05/19/18 11:59 PM. Always use your most recent med list.           albuterol 108 (90 Base) MCG/ACT inhaler Commonly known as:  PROVENTIL HFA;VENTOLIN HFA Inhale 2 puffs into the lungs QID.   ALPRAZolam 0.5 MG tablet Commonly known as:  XANAX Take 1 tablet (0.5 mg total) by mouth 2 (two) times daily as needed for anxiety.   amLODipine 5 MG tablet Commonly known as:  NORVASC Take 1 tablet (5 mg total) by mouth daily.   aspirin EC 81 MG tablet Take 81 mg by mouth daily.   citalopram 20 MG tablet Commonly known as:  CELEXA Take 1 tablet (20 mg total) by mouth daily.   meloxicam 7.5 MG tablet Commonly known as:  MOBIC Take 7.5 mg by mouth daily as needed for pain.   polyethylene glycol packet Commonly known as:  MIRALAX / GLYCOLAX Take 17 g by mouth daily as needed for moderate constipation.   senna 8.6 MG Tabs tablet Commonly known as:  SENOKOT Take 1 tablet (8.6 mg total) by mouth at bedtime.          Objective:    BP 120/66   Pulse 77   Temp (!) 97.4 F (36.3 C) (Oral)   Ht 4' 11.5" (1.511 m)   Wt 106 lb 3.2 oz (48.2 kg)   BMI 21.09 kg/m   Allergies  Allergen Reactions  . Penicillins Rash    Has patient had a PCN reaction causing immediate rash, facial/tongue/throat swelling, SOB or lightheadedness with  hypotension:No Has patient had a PCN reaction causing severe rash involving mucus membranes or skin necrosis:Yes Has patient had a PCN reaction that required hospitalization:No Has patient had a PCN reaction occurring within the last 10 years:No If all of the above answers are "NO", then may proceed with Cephalosporin use.     Wt Readings from Last 3 Encounters:  05/19/18 106 lb 3.2 oz (48.2 kg)  05/12/18 105 lb 6.4 oz (47.8 kg)  04/21/18 102 lb (46.3 kg)    Physical Exam  Constitutional: She is oriented to person, place, and time. She appears well-developed and well-nourished.  HENT:  Head: Normocephalic and atraumatic.  Eyes: Pupils are equal, round, and reactive to light. Conjunctivae and EOM are normal.   Cardiovascular: Normal rate, regular rhythm, normal heart sounds and intact distal pulses.  Pulmonary/Chest: Effort normal and breath sounds normal.  Abdominal: Soft. Bowel sounds are normal.  Neurological: She is alert and oriented to person, place, and time. She has normal reflexes.  Skin: Skin is warm and dry. No rash noted.  Psychiatric: She has a normal mood and affect. Her behavior is normal. Judgment and thought content normal.    Results for orders placed or performed during the hospital encounter of 04/13/18  CBC with Differential  Result Value Ref Range   WBC 10.1 4.0 - 10.5 K/uL   RBC 4.32 3.87 - 5.11 MIL/uL   Hemoglobin 13.4 12.0 - 15.0 g/dL   HCT 40.2 36.0 - 46.0 %   MCV 93.1 78.0 - 100.0 fL   MCH 31.0 26.0 - 34.0 pg   MCHC 33.3 30.0 - 36.0 g/dL   RDW 13.1 11.5 - 15.5 %   Platelets 240 150 - 400 K/uL   Neutrophils Relative % 81 %   Neutro Abs 8.1 (H) 1.7 - 7.7 K/uL   Lymphocytes Relative 11 %   Lymphs Abs 1.1 0.7 - 4.0 K/uL   Monocytes Relative 8 %   Monocytes Absolute 0.8 0.1 - 1.0 K/uL   Eosinophils Relative 0 %   Eosinophils Absolute 0.0 0.0 - 0.7 K/uL   Basophils Relative 0 %   Basophils Absolute 0.0 0.0 - 0.1 K/uL  Basic metabolic panel  Result Value Ref Range   Sodium 130 (L) 135 - 145 mmol/L   Potassium 4.5 3.5 - 5.1 mmol/L   Chloride 95 (L) 98 - 111 mmol/L   CO2 32 22 - 32 mmol/L   Glucose, Bld 128 (H) 70 - 99 mg/dL   BUN 15 8 - 23 mg/dL   Creatinine, Ser 1.16 (H) 0.44 - 1.00 mg/dL   Calcium 8.9 8.9 - 10.3 mg/dL   GFR calc non Af Amer 41 (L) >60 mL/min   GFR calc Af Amer 48 (L) >60 mL/min   Anion gap 3 (L) 5 - 15  Troponin I  Result Value Ref Range   Troponin I <0.03 <0.03 ng/mL  CBC  Result Value Ref Range   WBC 6.7 4.0 - 10.5 K/uL   RBC 3.94 3.87 - 5.11 MIL/uL   Hemoglobin 11.9 (L) 12.0 - 15.0 g/dL   HCT 36.6 36.0 - 46.0 %   MCV 92.9 78.0 - 100.0 fL   MCH 30.2 26.0 - 34.0 pg   MCHC 32.5 30.0 - 36.0 g/dL   RDW 13.0 11.5 - 15.5 %    Platelets 208 150 - 400 K/uL  Comprehensive metabolic panel  Result Value Ref Range   Sodium 133 (L) 135 - 145 mmol/L   Potassium 4.4 3.5 -  5.1 mmol/L   Chloride 97 (L) 98 - 111 mmol/L   CO2 27 22 - 32 mmol/L   Glucose, Bld 164 (H) 70 - 99 mg/dL   BUN 14 8 - 23 mg/dL   Creatinine, Ser 1.04 (H) 0.44 - 1.00 mg/dL   Calcium 8.9 8.9 - 10.3 mg/dL   Total Protein 6.3 (L) 6.5 - 8.1 g/dL   Albumin 3.6 3.5 - 5.0 g/dL   AST 15 15 - 41 U/L   ALT 11 0 - 44 U/L   Alkaline Phosphatase 71 38 - 126 U/L   Total Bilirubin 0.8 0.3 - 1.2 mg/dL   GFR calc non Af Amer 47 (L) >60 mL/min   GFR calc Af Amer 55 (L) >60 mL/min   Anion gap 9 5 - 15      Assessment & Plan:   1. Anxiety - ALPRAZolam (XANAX) 0.5 MG tablet; Take 1 tablet (0.5 mg total) by mouth 2 (two) times daily as needed for anxiety.  Dispense: 60 tablet; Refill: 5 - citalopram (CELEXA) 20 MG tablet; Take 1 tablet (20 mg total) by mouth daily.  Dispense: 90 tablet; Refill: 3  2. Essential hypertension - amLODipine (NORVASC) 5 MG tablet; Take 1 tablet (5 mg total) by mouth daily.  Dispense: 90 tablet; Refill: 3  3. Depression, recurrent ([place]) - citalopram (CELEXA) 20 MG tablet; Take 1 tablet (20 mg total) by mouth daily.  Dispense: 90 tablet; Refill: 3  4. Constipation, unspecified constipation type - senna (SENOKOT) 8.6 MG TABS tablet; Take 1 tablet (8.6 mg total) by mouth at bedtime.   Continue all other maintenance medications as listed above.  Follow up plan: Return in about 6 months (around 11/18/2018) for recheck.  Educational handout given for [place] PA-C [facility] [address]  [place], [place] [address] [phone]   05/23/2018, 9:56 PM

## 2018-08-25 ENCOUNTER — Encounter: Payer: Self-pay | Admitting: Pulmonary Disease

## 2018-08-25 ENCOUNTER — Ambulatory Visit: Payer: Medicare Other | Admitting: Pulmonary Disease

## 2018-08-25 ENCOUNTER — Ambulatory Visit (INDEPENDENT_AMBULATORY_CARE_PROVIDER_SITE_OTHER): Payer: Medicare Other | Admitting: Pulmonary Disease

## 2018-08-25 VITALS — BP 124/70 | HR 63 | Ht 61.0 in | Wt 114.0 lb

## 2018-08-25 DIAGNOSIS — J449 Chronic obstructive pulmonary disease, unspecified: Secondary | ICD-10-CM | POA: Diagnosis not present

## 2018-08-25 DIAGNOSIS — J432 Centrilobular emphysema: Secondary | ICD-10-CM

## 2018-08-25 DIAGNOSIS — Z87891 Personal history of nicotine dependence: Secondary | ICD-10-CM | POA: Diagnosis not present

## 2018-08-25 LAB — PULMONARY FUNCTION TEST
DL/VA % pred: 95 %
DL/VA: 3.94 ml/min/mmHg/L
DLCO UNC: 12.82 ml/min/mmHg
DLCO unc % pred: 77 %
FEF 25-75 Pre: 0.39 L/sec
FEF2575-%Pred-Pre: 41 %
FEV1-%Pred-Pre: 63 %
FEV1-Pre: 0.92 L
FEV1FVC-%PRED-PRE: 78 %
FEV6-%Pred-Pre: 85 %
FEV6-PRE: 1.57 L
FEV6FVC-%Pred-Pre: 104 %
FVC-%PRED-PRE: 81 %
FVC-PRE: 1.61 L
Pre FEV1/FVC ratio: 57 %
Pre FEV6/FVC Ratio: 97 %
RV % pred: 145 %
RV: 3.42 L
TLC % PRED: 111 %
TLC: 5.13 L

## 2018-08-25 NOTE — Progress Notes (Signed)
Synopsis: Referred in 04/2018 for moderate emphysema/COPD, well-controlled on SABA  Subjective:   PATIENT ID: Kelly Peters GENDER: female DOB: 17-Sep-1929, MRN: [id number]   HPI  Chief Complaint  Patient presents with  . Follow-up    Pt completed full PFT today prior to ov, and is doing well overall.   Kelly Peters is a 85 year old female with history of active tobacco abuse and presumed COPD (no PFTs on file) who presents for follow-up.  Last seen in clinic with me on 05/12/2018 for COPD exacerbation hospital follow-up. She does not have exacerbations often and has not had any more in the last three months. Denies any urgent care or ED visit. She shared that she quit smoking and reports that she now rarely uses her rescue inhaler. Denies cough, shortness of breath or wheezing. She is able to perform activities of daily living and enjoys doing yardwork when the weather is not cold.   Social History: Quit smoking end of 2019. 50 pack year history  Environmental exposures: Denies any known environmental exposure  I have personally reviewed patient's past medical/family/social history/allergies/current medications.  Past Medical History:  Diagnosis Date  . Hypertension      Family History  Problem Relation Age of Onset  . Stroke Mother   . Heart attack Father       Allergies  Allergen Reactions  . Penicillins Rash    Has patient had a PCN reaction causing immediate rash, facial/tongue/throat swelling, SOB or lightheadedness with hypotension:No Has patient had a PCN reaction causing severe rash involving mucus membranes or skin necrosis:Yes Has patient had a PCN reaction that required hospitalization:No Has patient had a PCN reaction occurring within the last 10 years:No If all of the above answers are "NO", then may proceed with Cephalosporin use.      Outpatient Medications Prior to Visit  Medication Sig Dispense Refill  . albuterol (PROVENTIL HFA;VENTOLIN HFA)  108 (90 Base) MCG/ACT inhaler Inhale 2 puffs into the lungs QID. 1 Inhaler 3  . ALPRAZolam (XANAX) 0.5 MG tablet Take 1 tablet (0.5 mg total) by mouth 2 (two) times daily as needed for anxiety. 60 tablet 5  . amLODipine (NORVASC) 5 MG tablet Take 1 tablet (5 mg total) by mouth daily. 90 tablet 3  . aspirin EC 81 MG tablet Take 81 mg by mouth daily.    . citalopram (CELEXA) 20 MG tablet Take 1 tablet (20 mg total) by mouth daily. 90 tablet 3  . meloxicam (MOBIC) 7.5 MG tablet Take 7.5 mg by mouth daily as needed for pain.    . polyethylene glycol (MIRALAX / GLYCOLAX) packet Take 17 g by mouth daily as needed for moderate constipation.     . senna (SENOKOT) 8.6 MG TABS tablet Take 1 tablet (8.6 mg total) by mouth at bedtime.     No facility-administered medications prior to visit.     Review of Systems  Constitutional: Negative for chills, diaphoresis, fever, malaise/fatigue and weight loss.  HENT: Negative for congestion, ear pain and sore throat.   Respiratory: Negative for cough, hemoptysis, sputum production, shortness of breath and wheezing.   Cardiovascular: Negative for chest pain, palpitations and leg swelling.  Gastrointestinal: Negative for abdominal pain, heartburn and nausea.  Genitourinary: Negative for frequency.  Musculoskeletal: Negative for joint pain and myalgias.  Skin: Negative for itching and rash.  Neurological: Negative for dizziness, weakness and headaches.  Endo/Heme/Allergies: Does not bruise/bleed easily.  Psychiatric/Behavioral: Negative for depression. The patient is not nervous/anxious.  Objective:    Vitals:   08/25/18 1033  BP: 124/70  Pulse: 63  SpO2: 97%  Weight: 114 lb (51.7 kg)  Height: 5\' 1"  (1.549 m)   Physical Exam: General: Well-appearing, no acute distress HENT: [place], AT, OP clear, MMM Eyes: EOMI, no scleral icterus Respiratory: Clear to auscultation bilaterally.  No crackles, wheezing or rales Cardiovascular: RRR, -M/R/G, no JVD GI:  BS+, soft, nontender Extremities:-Edema,-tenderness Neuro: AAO x4, CNII-XII grossly intact Skin: Intact, no rashes or bruising Psych: Normal mood, normal affect  Chest imaging: CTA 04/13/18: No evidence of PE.  Emphysematous changes throughout lung fields.  No acute lung abnormalities  PFT:  08/25/2018-moderate obstructive defect with air trapping and mildly reduced DLCO consistent with emphysema Ratio 57 FVC 1.61 (81%) FEV1 0.92 (63%)  I have personally reviewed the above labs, images and tests noted above.    Assessment & Plan:   No diagnosis found.  Discussion: 85 year old female with active tobacco abuse and emphysema who presents for follow-up.  Not an active exacerbation and reports symptom control with PRN SABA.  Reviewed PFTs this visit demonstrating moderate COPD.  Moderate COPD, Gold class A Emphysema --CONTINUE albuterol inhaler 2 puffs every 4 hours as needed for shortness of breath or wheezing  Tobacco abuse Patient quit smoking in 2019. --Discuss annual CT Lung Screen. Dude 03/2019  Vaccinations: Influenza vaccine 04/2018 Pneumovax due. Patient declined  Return in about 6 months (around 02/23/2019).  Kelly Ruberg Rodman Pickle, MD [facility] 08/26/2018 6:43 PM  Personal pager: [phone] If unanswered, please page CCM On-call: [phone]

## 2018-08-25 NOTE — Progress Notes (Signed)
Patient completed PFT today. Patient refused breathing treatment to complete the post spiro, therefore post is not done today. Patient refused.

## 2018-08-26 DIAGNOSIS — J449 Chronic obstructive pulmonary disease, unspecified: Secondary | ICD-10-CM | POA: Insufficient documentation

## 2018-08-26 DIAGNOSIS — Z87891 Personal history of nicotine dependence: Secondary | ICD-10-CM | POA: Insufficient documentation

## 2018-11-16 ENCOUNTER — Other Ambulatory Visit: Payer: Self-pay

## 2018-11-16 ENCOUNTER — Ambulatory Visit (INDEPENDENT_AMBULATORY_CARE_PROVIDER_SITE_OTHER): Payer: Medicare Other | Admitting: Physician Assistant

## 2018-11-16 ENCOUNTER — Encounter: Payer: Self-pay | Admitting: Physician Assistant

## 2018-11-16 DIAGNOSIS — I10 Essential (primary) hypertension: Secondary | ICD-10-CM | POA: Diagnosis not present

## 2018-11-16 DIAGNOSIS — J449 Chronic obstructive pulmonary disease, unspecified: Secondary | ICD-10-CM

## 2018-11-16 DIAGNOSIS — F419 Anxiety disorder, unspecified: Secondary | ICD-10-CM | POA: Diagnosis not present

## 2018-11-16 MED ORDER — ALPRAZOLAM 0.5 MG PO TABS: 0.5000 mg | ORAL_TABLET | Freq: Two times a day (BID) | ORAL | 5 refills | Status: DC | PRN

## 2018-11-16 NOTE — Progress Notes (Signed)
Telephone visit  Subjective: CC: Anxiety, hypertension, COPD PCP: Terald Sleeper, PA-C Kelly Peters is a 85 y.o. female calls for telephone consult today. Patient provides verbal consent for consult held via phone.  Patient is identified with 2 separate identifiers.  At this time the entire area is on COVID-19 social distancing and stay home orders are in place.  Patient is of higher risk and therefore we are performing this by a virtual method.  Location of patient: Home Location of provider: [facility] Others present for call: No  This is a periodic recheck for the patient's chronic medical conditions.  She reports overall that she is doing well not having any problems with her medication.  She did see pulmonology and they did give her the diagnosis of emphysema/COPD.  She has albuterol as needed.  But she has found that she has had very little need for it.  I encouraged her to use it if it arises.  She is on Norvasc 5 mg 1 daily for her hypertension she is not able to check her blood pressure.  She denies any chest pain or shortness of breath.  She does have a tiny bit of swelling in her ankles.  It is not very severe.  She also has chronic anxiety and does take Celexa and alprazolam.  In reviewing the narcotic registry she has very low risk and does get her medication filled properly and sometimes even on a more infrequent basis.  Her greatest LME was 2.   ROS: Per HPI  Allergies  Allergen Reactions  . Penicillins Rash    Has patient had a PCN reaction causing immediate rash, facial/tongue/throat swelling, SOB or lightheadedness with hypotension:No Has patient had a PCN reaction causing severe rash involving mucus membranes or skin necrosis:Yes Has patient had a PCN reaction that required hospitalization:No Has patient had a PCN reaction occurring within the last 10 years:No If all of the above answers are "NO", then may proceed with Cephalosporin use.    Past Medical History:   Diagnosis Date  . Hypertension     Current Outpatient Medications:  .  albuterol (PROVENTIL HFA;VENTOLIN HFA) 108 (90 Base) MCG/ACT inhaler, Inhale 2 puffs into the lungs QID., Disp: 1 Inhaler, Rfl: 3 .  ALPRAZolam (XANAX) 0.5 MG tablet, Take 1 tablet (0.5 mg total) by mouth 2 (two) times daily as needed for anxiety., Disp: 60 tablet, Rfl: 5 .  amLODipine (NORVASC) 5 MG tablet, Take 1 tablet (5 mg total) by mouth daily., Disp: 90 tablet, Rfl: 3 .  aspirin EC 81 MG tablet, Take 81 mg by mouth daily., Disp: , Rfl:  .  citalopram (CELEXA) 20 MG tablet, Take 1 tablet (20 mg total) by mouth daily., Disp: 90 tablet, Rfl: 3 .  polyethylene glycol (MIRALAX / GLYCOLAX) packet, Take 17 g by mouth daily as needed for moderate constipation. , Disp: , Rfl:  .  senna (SENOKOT) 8.6 MG TABS tablet, Take 1 tablet (8.6 mg total) by mouth at bedtime., Disp: , Rfl:   Assessment/ Plan: 85 y.o. female   1. Anxiety - ALPRAZolam (XANAX) 0.5 MG tablet; Take 1 tablet (0.5 mg total) by mouth 2 (two) times daily as needed for anxiety.  Dispense: 60 tablet; Refill: 5 Continue Celexa  2. Essential hypertension Continue Norvasc 5 mg 1 daily  3. Moderate COPD (chronic obstructive pulmonary disease) (HCC) Continue use albuterol as needed for cough or wheeze.   Start time: 9:40 AM End time: 9:47 AM  Meds ordered  this encounter  Medications  . ALPRAZolam (XANAX) 0.5 MG tablet    Sig: Take 1 tablet (0.5 mg total) by mouth 2 (two) times daily as needed for anxiety.    Dispense:  60 tablet    Refill:  5    Order Specific Question:   Supervising Provider    Answer:   Janora Norlander [[id number]]    Particia Nearing PA-C [place] [phone]

## 2018-12-14 ENCOUNTER — Ambulatory Visit (INDEPENDENT_AMBULATORY_CARE_PROVIDER_SITE_OTHER): Payer: Medicare Other | Admitting: *Deleted

## 2018-12-14 ENCOUNTER — Other Ambulatory Visit: Payer: Self-pay

## 2018-12-14 DIAGNOSIS — Z0000 Encounter for general adult medical examination without abnormal findings: Secondary | ICD-10-CM

## 2018-12-14 NOTE — Patient Instructions (Signed)
Preventive Care 83 Years and Older, Female Preventive care refers to lifestyle choices and visits with your health care provider that can promote health and wellness. What does preventive care include?  A yearly physical exam. This is also called an annual well check.  Dental exams once or twice a year.  Routine eye exams. Ask your health care provider how often you should have your eyes checked.  Personal lifestyle choices, including: ? Daily care of your teeth and gums. ? Regular physical activity. ? Eating a healthy diet. ? Avoiding tobacco and drug use. ? Limiting alcohol use. ? Practicing safe sex. ? Taking low-dose aspirin every day. ? Taking vitamin and mineral supplements as recommended by your health care provider. What happens during an annual well check? The services and screenings done by your health care provider during your annual well check will depend on your age, overall health, lifestyle risk factors, and family history of disease. Counseling Your health care provider may ask you questions about your:  Alcohol use.  Tobacco use.  Drug use.  Emotional well-being.  Home and relationship well-being.  Sexual activity.  Eating habits.  History of falls.  Memory and ability to understand (cognition).  Work and work Statistician.  Reproductive health.  Screening You may have the following tests or measurements:  Height, weight, and BMI.  Blood pressure.  Lipid and cholesterol levels. These may be checked every 5 years, or more frequently if you are over 30 years old.  Skin check.  Lung cancer screening. You may have this screening every year starting at age 27 if you have a 30-pack-year history of smoking and currently smoke or have quit within the past 15 years.  Colorectal cancer screening. All adults should have this screening starting at age 33 and continuing until age 46. You will have tests every 1-10 years, depending on your results and the  type of screening test. People at increased risk should start screening at an earlier age. Screening tests may include: ? Guaiac-based fecal occult blood testing. ? Fecal immunochemical test (FIT). ? Stool DNA test. ? Virtual colonoscopy. ? Sigmoidoscopy. During this test, a flexible tube with a tiny camera (sigmoidoscope) is used to examine your rectum and lower colon. The sigmoidoscope is inserted through your anus into your rectum and lower colon. ? Colonoscopy. During this test, a long, thin, flexible tube with a tiny camera (colonoscope) is used to examine your entire colon and rectum.  Hepatitis C blood test.  Hepatitis B blood test.  Sexually transmitted disease (STD) testing.  Diabetes screening. This is done by checking your blood sugar (glucose) after you have not eaten for a while (fasting). You may have this done every 1-3 years.  Bone density scan. This is done to screen for osteoporosis. You may have this done starting at age 37.  Mammogram. This may be done every 1-2 years. Talk to your health care provider about how often you should have regular mammograms. Talk with your health care provider about your test results, treatment options, and if necessary, the need for more tests. Vaccines Your health care provider may recommend certain vaccines, such as:  Influenza vaccine. This is recommended every year.  Tetanus, diphtheria, and acellular pertussis (Tdap, Td) vaccine. You may need a Td booster every 10 years.  Varicella vaccine. You may need this if you have not been vaccinated.  Zoster vaccine. You may need this after age 38.  Measles, mumps, and rubella (MMR) vaccine. You may need at least  one dose of MMR if you were born in 1957 or later. You may also need a second dose.  Pneumococcal 13-valent conjugate (PCV13) vaccine. One dose is recommended after age 24.  Pneumococcal polysaccharide (PPSV23) vaccine. One dose is recommended after age 24.  Meningococcal  vaccine. You may need this if you have certain conditions.  Hepatitis A vaccine. You may need this if you have certain conditions or if you travel or work in places where you may be exposed to hepatitis A.  Hepatitis B vaccine. You may need this if you have certain conditions or if you travel or work in places where you may be exposed to hepatitis B.  Haemophilus influenzae type b (Hib) vaccine. You may need this if you have certain conditions. Talk to your health care provider about which screenings and vaccines you need and how often you need them. This information is not intended to replace advice given to you by your health care provider. Make sure you discuss any questions you have with your health care provider. Document Released: 08/03/2015 Document Revised: 08/27/2017 Document Reviewed: 05/08/2015 Elsevier Interactive Patient Education  2019 [employer].

## 2018-12-14 NOTE — Progress Notes (Signed)
MEDICARE ANNUAL WELLNESS VISIT  12/14/2018  Telephone Visit Disclaimer This Medicare AWV was conducted by telephone due to national recommendations for restrictions regarding the COVID-19 Pandemic (e.g. social distancing).  I verified, using two identifiers, that I am speaking with Kelly Peters or their authorized healthcare agent. I discussed the limitations, risks, security, and privacy concerns of performing an evaluation and management service by telephone and the potential availability of an in-person appointment in the future. The patient expressed understanding and agreed to proceed.   Subjective:  Kelly Peters is a 85 y.o. female patient of Terald Sleeper, PA-C who had a Medicare Annual Wellness Visit today via telephone. [name] is Retired and lives alone but her 2 daughters visit her daily. she has 3 children. she reports that she is socially active and does interact with friends/family regularly. she is not physically active and enjoys reading, watching TV, doing word puzzles and yard work.  Patient Care Team: Theodoro Clock as PCP - General (Physician Assistant)  Advanced Directives 12/14/2018 04/14/2018 04/13/2018 06/23/2017 10/03/2016 09/30/2016  Does Patient Have a Medical Advance Directive? Yes - No Yes Yes No  Type of Advance Directive Living will - - Living will Living will -  Does patient want to make changes to medical advance directive? No - Patient declined - - No - Patient declined No - Patient declined -  Would patient like information on creating a medical advance directive? - No - Patient declined - - No - Patient declined -    Hospital Utilization Over the Past 12 Months: # of hospitalizations or ER visits: 1 # of surgeries: 0  Review of Systems    Patient reports that her overall health is unchanged compared to last year.  Patient Reported Readings (BP, Pulse, CBG, Weight, etc) BP- 122/63  P-74   O2-95% (room air)   Wt-121lbs -vitals reported by pt  from measurements this morning prior to the call  Review of Systems: No complaints  All other systems negative.  Pain Assessment Pain : No/denies pain     Current Medications & Allergies (verified) Allergies as of 12/14/2018      Reactions   Penicillins Rash   Has patient had a PCN reaction causing immediate rash, facial/tongue/throat swelling, SOB or lightheadedness with hypotension:No Has patient had a PCN reaction causing severe rash involving mucus membranes or skin necrosis:Yes Has patient had a PCN reaction that required hospitalization:No Has patient had a PCN reaction occurring within the last 10 years:No If all of the above answers are "NO", then may proceed with Cephalosporin use.      Medication List       Accurate as of Dec 14, 2018  9:05 AM. If you have any questions, ask your nurse or doctor.        albuterol 108 (90 Base) MCG/ACT inhaler Commonly known as:  VENTOLIN HFA Inhale 2 puffs into the lungs QID.   ALPRAZolam 0.5 MG tablet Commonly known as:  XANAX Take 1 tablet (0.5 mg total) by mouth 2 (two) times daily as needed for anxiety.   amLODipine 5 MG tablet Commonly known as:  NORVASC Take 1 tablet (5 mg total) by mouth daily.   aspirin EC 81 MG tablet Take 81 mg by mouth daily.   citalopram 20 MG tablet Commonly known as:  CELEXA Take 1 tablet (20 mg total) by mouth daily.   polyethylene glycol 17 g packet Commonly known as:  MIRALAX / GLYCOLAX Take 17  g by mouth daily as needed for moderate constipation.   senna 8.6 MG Tabs tablet Commonly known as:  SENOKOT Take 1 tablet (8.6 mg total) by mouth at bedtime.       History (reviewed): Past Medical History:  Diagnosis Date   Allergy    Anxiety    COPD (chronic obstructive pulmonary disease) ([place])    Depression    Hypertension    Past Surgical History:  Procedure Laterality Date   ABDOMINAL HYSTERECTOMY     CATARACT EXTRACTION W/PHACO Left 10/03/2016   Procedure: CATARACT  EXTRACTION PHACO AND INTRAOCULAR LENS PLACEMENT LEFT EYE CDE - 25.42;  Surgeon: Baruch Goldmann, MD;  Location: [facility];  Service: Ophthalmology;  Laterality: Left;  left   CATARACT EXTRACTION W/PHACO Right 10/24/2016   Procedure: CATARACT EXTRACTION PHACO AND INTRAOCULAR LENS PLACEMENT RIGHT EYE CDE= 24.99;  Surgeon: Baruch Goldmann, MD;  Location: [facility];  Service: Ophthalmology;  Laterality: Right;  right   Family History  Problem Relation Age of Onset   Stroke Mother    Heart attack Father    Heart attack Brother    Diabetes Sister    Stroke Sister    COPD Daughter    Hypertension Daughter    Heart disease Son    Hypertension Son    Cancer Brother        kidney cancer   Stroke Brother    COPD Brother    Diabetes Sister    Thyroid disease Daughter    Social History   Socioeconomic History   Marital status: Widowed    Spouse name: Not on file   Number of children: 3   Years of education: Not on file   Highest education level: 7th grade  Occupational History   Occupation: Retired  Scientist, 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 very hard   Food insecurity:    Worry: Never true    Inability: Never true   Transportation needs:    Medical: No    Non-medical: No  Tobacco Use   Smoking status: Former Smoker    Packs/day: 0.50    Years: 58.00    Pack years: 29.00    Last attempt to quit: 07/23/2018    Years since quitting: 0.3   Smokeless tobacco: Never Used   Tobacco comment: down to 4-5 cigarettes a day 05/12/18  Substance and Sexual Activity   Alcohol use: Not Currently   Drug use: Never   Sexual activity: Not Currently    Birth control/protection: Surgical  Lifestyle   Physical activity:    Days per week: 0 days    Minutes per session: 0 min   Stress: Not at all  Relationships   Social connections:    Talks on phone: More than three times a week    Gets together: More than three times a week    Attends religious service: Never     Active member of club or organization: No    Attends meetings of clubs or organizations: Never    Relationship status: Widowed  Other Topics Concern   Not on file  Social History Narrative   Not on file    Activities of Daily Living In your present state of health, do you have any difficulty performing the following activities: 12/14/2018 04/13/2018  Hearing? Y N  Comment pt states she does think her hearing is getting worse and she says even if she had to have hearing aids she probably wouldn't wear them -  Vision? N N  Difficulty concentrating or making decisions? N N  Walking or climbing stairs? N N  Dressing or bathing? N N  Doing errands, shopping? N N  Preparing Food and eating ? N -  Using the Toilet? N -  In the past six months, have you accidently leaked urine? Y -  Comment pt says it only happens when she coughs, sneezes or sometimes when she runs water -  Do you have problems with loss of bowel control? N -  Managing your Medications? N -  Managing your Finances? N -  Housekeeping or managing your Housekeeping? N -  Some recent data might be hidden    Patient Literacy How often do you need to have someone help you when you read instructions, pamphlets, or other written materials from your doctor or pharmacy?: 1 - Never What is the last grade level you completed in school?: 7th grade  Exercise Current Exercise Habits: The patient does not participate in regular exercise at present, Exercise limited by: respiratory conditions(s)  Diet Patient reports consuming 2 meals a day and 1 snack(s) a day Patient reports that her primary diet is: Regular Patient reports that she does have regular access to food.   Depression Screen PHQ 2/9 Scores 12/14/2018 05/19/2018 04/21/2018 04/08/2018 02/11/2018 07/06/2017 06/08/2017  PHQ - 2 Score 0 0 0 0 2 2 2   PHQ- 9 Score - - - - 7 7 7      Fall Risk Fall Risk  12/14/2018 05/19/2018 04/21/2018 04/08/2018 02/11/2018  Falls in the past  year? 0 No No No No     Objective:  Kelly Peters seemed alert and oriented and she participated appropriately during our telephone visit.  Blood Pressure Weight BMI  BP Readings from Last 3 Encounters:  08/25/18 124/70  05/19/18 120/66  05/12/18 (!) 144/62   Wt Readings from Last 3 Encounters:  08/25/18 114 lb (51.7 kg)  05/19/18 106 lb 3.2 oz (48.2 kg)  05/12/18 105 lb 6.4 oz (47.8 kg)   BMI Readings from Last 1 Encounters:  08/25/18 21.54 kg/m    *Unable to obtain current vital signs, weight, and BMI due to telephone visit type  Hearing/Vision   Kelly Peters did not seem to have difficulty with hearing/understanding during the telephone conversation  Reports that she has not had a formal eye exam by an eye care professional within the past year  Reports that she has not had a formal hearing evaluation within the past year *Unable to fully assess hearing and vision during telephone visit type  Cognitive Function: 6CIT Screen 12/14/2018  What Year? 0 points  What month? 0 points  What time? 0 points  Count back from 20 2 points  Months in reverse 0 points  Repeat phrase 2 points  Total Score 4    Normal Cognitive Function Screening: Yes (Normal:0-7, Significant for Dysfunction: >8)  Immunization & Health Maintenance Record Immunization History  Administered Date(s) Administered   Influenza, High Dose Seasonal PF 04/21/2018    Health Maintenance  Topic Date Due   TETANUS/TDAP  01/30/1949   DEXA SCAN  01/29/1997   PNA vac Low Risk Adult (1 of 2 - PCV13) 01/29/1997   INFLUENZA VACCINE  02/19/2019       Assessment  This is a routine wellness examination for Kelly Peters.  Health Maintenance: Due or Overdue Health Maintenance Due  Topic Date Due   TETANUS/TDAP  01/30/1949   DEXA SCAN  01/29/1997   PNA vac Low Risk Adult (1 of  2 - PCV13) 01/29/1997    Kelly Peters does not need a referral for Community Assistance: Care Management:   no Social  Work:    no Prescription Assistance:  no Nutrition/Diabetes Education:  no   Plan:  Personalized Goals Goals Addressed            This Visit's Progress    DIET - INCREASE WATER INTAKE        Personalized Health Maintenance & Screening Recommendations  TDAP, Pneumonia Series and Shingles series which pt states she will consider and discuss with PCP at next visit  Lung Cancer Screening Recommended: pt declines (Low Dose CT Chest recommended if Age 42-80 years, 30 pack-year currently smoking OR have quit w/in past 15 years) Hepatitis C Screening recommended: no HIV Screening recommended: no  Advanced Directives: Written information was not prepared per patient's request.  Referrals & Orders No orders of the defined types were placed in this encounter.   Follow-up Plan  Follow-up with Terald Sleeper, PA-C as planned  Consider TDAP, pneumonia and shingles vaccines  Bring a copy of your Advanced Directives to your next visit for our records as discussed    I have personally reviewed and noted the following in the patients chart:    Medical and social history  Use of alcohol, tobacco or illicit drugs   Current medications and supplements  Functional ability and status  Nutritional status  Physical activity  Advanced directives  List of other physicians  Hospitalizations, surgeries, and ER visits in previous 12 months  Vitals  Screenings to include cognitive, depression, and falls  Referrals and appointments  In addition, I have reviewed and discussed with Kelly Peters certain preventive protocols, quality metrics, and best practice recommendations. A written personalized care plan for preventive services as well as general preventive health recommendations is available and can be mailed to the patient at her request.      signature  12/14/2018

## 2019-03-22 ENCOUNTER — Other Ambulatory Visit: Payer: Self-pay | Admitting: Physician Assistant

## 2019-03-22 DIAGNOSIS — I10 Essential (primary) hypertension: Secondary | ICD-10-CM

## 2019-05-05 ENCOUNTER — Other Ambulatory Visit: Payer: Self-pay

## 2019-05-05 ENCOUNTER — Telehealth: Payer: Self-pay | Admitting: Physician Assistant

## 2019-05-06 ENCOUNTER — Ambulatory Visit (INDEPENDENT_AMBULATORY_CARE_PROVIDER_SITE_OTHER): Payer: Medicare Other | Admitting: Physician Assistant

## 2019-05-06 ENCOUNTER — Encounter: Payer: Self-pay | Admitting: Physician Assistant

## 2019-05-06 VITALS — BP 133/75 | HR 97 | Temp 97.5°F | Ht 61.0 in | Wt 124.8 lb

## 2019-05-06 DIAGNOSIS — F419 Anxiety disorder, unspecified: Secondary | ICD-10-CM

## 2019-05-06 DIAGNOSIS — Z0000 Encounter for general adult medical examination without abnormal findings: Secondary | ICD-10-CM

## 2019-05-06 DIAGNOSIS — I10 Essential (primary) hypertension: Secondary | ICD-10-CM

## 2019-05-06 DIAGNOSIS — L570 Actinic keratosis: Secondary | ICD-10-CM | POA: Diagnosis not present

## 2019-05-06 DIAGNOSIS — F339 Major depressive disorder, recurrent, unspecified: Secondary | ICD-10-CM

## 2019-05-06 MED ORDER — OMEPRAZOLE 20 MG PO CPDR: 20.0000 mg | DELAYED_RELEASE_CAPSULE | Freq: Every day | ORAL | 11 refills | Status: DC

## 2019-05-06 MED ORDER — AMLODIPINE BESYLATE 5 MG PO TABS: ORAL_TABLET | ORAL | 1 refills | Status: DC

## 2019-05-06 MED ORDER — CITALOPRAM HYDROBROMIDE 20 MG PO TABS: 20.0000 mg | ORAL_TABLET | Freq: Every day | ORAL | 3 refills | Status: DC

## 2019-05-06 MED ORDER — HYDROCORTISONE 2.5 % EX CREA: TOPICAL_CREAM | Freq: Two times a day (BID) | CUTANEOUS | 1 refills | Status: DC

## 2019-05-06 MED ORDER — ALPRAZOLAM 0.5 MG PO TABS: 0.5000 mg | ORAL_TABLET | Freq: Two times a day (BID) | ORAL | 5 refills | Status: DC | PRN

## 2019-05-07 LAB — CBC WITH DIFFERENTIAL/PLATELET
Basophils Absolute: 0 10*3/uL (ref 0.0–0.2)
Basos: 1 %
EOS (ABSOLUTE): 0.1 10*3/uL (ref 0.0–0.4)
Eos: 2 %
Hematocrit: 34.6 % (ref 34.0–46.6)
Hemoglobin: 11.4 g/dL (ref 11.1–15.9)
Immature Grans (Abs): 0 10*3/uL (ref 0.0–0.1)
Immature Granulocytes: 0 %
Lymphocytes Absolute: 1 10*3/uL (ref 0.7–3.1)
Lymphs: 23 %
MCH: 29.8 pg (ref 26.6–33.0)
MCHC: 32.9 g/dL (ref 31.5–35.7)
MCV: 91 fL (ref 79–97)
Monocytes Absolute: 0.4 10*3/uL (ref 0.1–0.9)
Monocytes: 10 %
Neutrophils Absolute: 2.8 10*3/uL (ref 1.4–7.0)
Neutrophils: 64 %
Platelets: 166 10*3/uL (ref 150–450)
RBC: 3.82 x10E6/uL (ref 3.77–5.28)
RDW: 12.7 % (ref 11.7–15.4)
WBC: 4.4 10*3/uL (ref 3.4–10.8)

## 2019-05-07 LAB — CMP14+EGFR
ALT: 5 IU/L (ref 0–32)
AST: 16 IU/L (ref 0–40)
Albumin/Globulin Ratio: 2 (ref 1.2–2.2)
Albumin: 4.5 g/dL (ref 3.6–4.6)
Alkaline Phosphatase: 88 IU/L (ref 39–117)
BUN/Creatinine Ratio: 13 (ref 12–28)
BUN: 17 mg/dL (ref 8–27)
Bilirubin Total: 0.5 mg/dL (ref 0.0–1.2)
CO2: 22 mmol/L (ref 20–29)
Calcium: 9.3 mg/dL (ref 8.7–10.3)
Chloride: 101 mmol/L (ref 96–106)
Creatinine, Ser: 1.27 mg/dL — ABNORMAL HIGH (ref 0.57–1.00)
GFR calc Af Amer: 44 mL/min/{1.73_m2} — ABNORMAL LOW (ref >59)
GFR calc non Af Amer: 38 mL/min/{1.73_m2} — ABNORMAL LOW (ref >59)
Globulin, Total: 2.3 g/dL (ref 1.5–4.5)
Glucose: 102 mg/dL — ABNORMAL HIGH (ref 65–99)
Potassium: 4.2 mmol/L (ref 3.5–5.2)
Sodium: 138 mmol/L (ref 134–144)
Total Protein: 6.8 g/dL (ref 6.0–8.5)

## 2019-05-07 LAB — MICROALBUMIN / CREATININE URINE RATIO
Creatinine, Urine: 149.2 mg/dL
Microalb/Creat Ratio: 7 mg/g creat (ref 0–29)
Microalbumin, Urine: 10.1 ug/mL

## 2019-05-07 LAB — LIPID PANEL
Chol/HDL Ratio: 3.4 ratio (ref 0.0–4.4)
Cholesterol, Total: 173 mg/dL (ref 100–199)
HDL: 51 mg/dL (ref >39)
LDL Chol Calc (NIH): 102 mg/dL — ABNORMAL HIGH (ref 0–99)
Triglycerides: 110 mg/dL (ref 0–149)
VLDL Cholesterol Cal: 20 mg/dL (ref 5–40)

## 2019-05-07 LAB — TSH: TSH: 5.94 u[IU]/mL — ABNORMAL HIGH (ref 0.450–4.500)

## 2019-05-08 ENCOUNTER — Other Ambulatory Visit: Payer: Self-pay | Admitting: Physician Assistant

## 2019-05-08 DIAGNOSIS — R946 Abnormal results of thyroid function studies: Secondary | ICD-10-CM

## 2019-05-09 ENCOUNTER — Encounter: Payer: Self-pay | Admitting: Physician Assistant

## 2019-05-09 NOTE — Progress Notes (Signed)
BP 133/75   Pulse 97   Temp (!) 97.5 F (36.4 C) (Temporal)   Ht 5' 1" (1.549 m)   Wt 124 lb 12.8 oz (56.6 kg)   SpO2 92%   BMI 23.58 kg/m    Subjective:    Patient ID: Kelly Peters, female    DOB: 06/09/1930, 85 y.o.   MRN: [id number]  HPI: Kelly Peters is a 85 y.o. female presenting on 05/06/2019 for Hypertension (6 month follow up)   This patient comes in for 28-monthfollow-up on her chronic medical conditions I do include hypertension and anxiety.  Her medications are reviewed.  She overall is doing very well.  She is trying to keep her alprazolam as few as possible.  Her PDMP is normal and there are no red flags.  ANXIETY ASSESSMENT Cause of anxiety: GAD This patient returns for a  month recheck on narcotic use for the above named condition(s)  Current medications-Celexa 20 mg 1 daily Xanax 0.5 mg 1 tablet up to twice daily. Other medications tried: None Medication side effects-none Any concerns-no Any change in general medical condition-none Effectiveness of current meds-good PMP AWARE website reviewed: Yes Any suspicious activity on PMP Aware: No LME daily dose: 1   Contract on file 05/06/2019 Last UDS  05/06/2019  History of overdose or risk of abuse no   Past Medical History:  Diagnosis Date  . Allergy   . Anxiety   . COPD (chronic obstructive pulmonary disease) ([place]   . Depression   . Hypertension    Relevant past medical, surgical, family and social history reviewed and updated as indicated. Interim medical history since our last visit reviewed. Allergies and medications reviewed and updated. DATA REVIEWED: CHART IN EPIC  Family History reviewed for pertinent findings.  Review of Systems  Constitutional: Negative.  Negative for activity change, fatigue and fever.  HENT: Negative.   Eyes: Negative.   Respiratory: Negative.  Negative for cough.   Cardiovascular: Negative.  Negative for chest pain.  Gastrointestinal: Negative.  Negative  for abdominal pain.  Endocrine: Negative.   Genitourinary: Negative.  Negative for dysuria.  Musculoskeletal: Positive for arthralgias.  Skin: Negative.   Neurological: Negative.   Psychiatric/Behavioral: Positive for decreased concentration. The patient is nervous/anxious.     Allergies as of 05/06/2019      Reactions   Penicillins Rash   Has patient had a PCN reaction causing immediate rash, facial/tongue/throat swelling, SOB or lightheadedness with hypotension:No Has patient had a PCN reaction causing severe rash involving mucus membranes or skin necrosis:Yes Has patient had a PCN reaction that required hospitalization:No Has patient had a PCN reaction occurring within the last 10 years:No If all of the above answers are "NO", then may proceed with Cephalosporin use.      Medication List       Accurate as of May 06, 2019 11:59 PM. If you have any questions, ask your nurse or doctor.        albuterol 108 (90 Base) MCG/ACT inhaler Commonly known as: VENTOLIN HFA Inhale 2 puffs into the lungs QID.   ALPRAZolam 0.5 MG tablet Commonly known as: XANAX Take 1 tablet (0.5 mg total) by mouth 2 (two) times daily as needed for anxiety.   amLODipine 5 MG tablet Commonly known as: NORVASC TAKE ONE (1) TABLET EACH DAY   aspirin EC 81 MG tablet Take 81 mg by mouth daily.   citalopram 20 MG tablet Commonly known as: CELEXA Take 1 tablet (20 mg  total) by mouth daily.   hydrocortisone 2.5 % cream Apply topically 2 (two) times daily. Started by: Terald Sleeper, PA-C   omeprazole 20 MG capsule Commonly known as: PRILOSEC Take 1 capsule (20 mg total) by mouth daily. Started by: Terald Sleeper, PA-C   polyethylene glycol 17 g packet Commonly known as: MIRALAX / GLYCOLAX Take 17 g by mouth daily as needed for moderate constipation.   senna 8.6 MG Tabs tablet Commonly known as: SENOKOT Take 1 tablet (8.6 mg total) by mouth at bedtime.          Objective:    BP 133/75    Pulse 97   Temp (!) 97.5 F (36.4 C) (Temporal)   Ht 5' 1" (1.549 m)   Wt 124 lb 12.8 oz (56.6 kg)   SpO2 92%   BMI 23.58 kg/m   Allergies  Allergen Reactions  . Penicillins Rash    Has patient had a PCN reaction causing immediate rash, facial/tongue/throat swelling, SOB or lightheadedness with hypotension:No Has patient had a PCN reaction causing severe rash involving mucus membranes or skin necrosis:Yes Has patient had a PCN reaction that required hospitalization:No Has patient had a PCN reaction occurring within the last 10 years:No If all of the above answers are "NO", then may proceed with Cephalosporin use.     Wt Readings from Last 3 Encounters:  05/06/19 124 lb 12.8 oz (56.6 kg)  08/25/18 114 lb (51.7 kg)  05/19/18 106 lb 3.2 oz (48.2 kg)    Physical Exam Constitutional:      General: She is not in acute distress.    Appearance: Normal appearance. She is well-developed.  HENT:     Head: Normocephalic and atraumatic.  Cardiovascular:     Rate and Rhythm: Normal rate.  Pulmonary:     Effort: Pulmonary effort is normal.  Skin:    General: Skin is warm and dry.     Findings: No rash.  Neurological:     Mental Status: She is alert and oriented to person, place, and time.     Deep Tendon Reflexes: Reflexes are normal and symmetric.     Results for orders placed or performed in visit on 05/06/19  CBC with Differential/Platelet  Result Value Ref Range   WBC 4.4 3.4 - 10.8 x10E3/uL   RBC 3.82 3.77 - 5.28 x10E6/uL   Hemoglobin 11.4 11.1 - 15.9 g/dL   Hematocrit 34.6 34.0 - 46.6 %   MCV 91 79 - 97 fL   MCH 29.8 26.6 - 33.0 pg   MCHC 32.9 31.5 - 35.7 g/dL   RDW 12.7 11.7 - 15.4 %   Platelets 166 150 - 450 x10E3/uL   Neutrophils 64 Not Estab. %   Lymphs 23 Not Estab. %   Monocytes 10 Not Estab. %   Eos 2 Not Estab. %   Basos 1 Not Estab. %   Neutrophils Absolute 2.8 1.4 - 7.0 x10E3/uL   Lymphocytes Absolute 1.0 0.7 - 3.1 x10E3/uL   Monocytes Absolute 0.4 0.1  - 0.9 x10E3/uL   EOS (ABSOLUTE) 0.1 0.0 - 0.4 x10E3/uL   Basophils Absolute 0.0 0.0 - 0.2 x10E3/uL   Immature Granulocytes 0 Not Estab. %   Immature Grans (Abs) 0.0 0.0 - 0.1 x10E3/uL  CMP14+EGFR  Result Value Ref Range   Glucose 102 (H) 65 - 99 mg/dL   BUN 17 8 - 27 mg/dL   Creatinine, Ser 1.27 (H) 0.57 - 1.00 mg/dL   GFR calc non Af Amer 38 (  L) >59 mL/min/1.73   GFR calc Af Amer 44 (L) >59 mL/min/1.73   BUN/Creatinine Ratio 13 12 - 28   Sodium 138 134 - 144 mmol/L   Potassium 4.2 3.5 - 5.2 mmol/L   Chloride 101 96 - 106 mmol/L   CO2 22 20 - 29 mmol/L   Calcium 9.3 8.7 - 10.3 mg/dL   Total Protein 6.8 6.0 - 8.5 g/dL   Albumin 4.5 3.6 - 4.6 g/dL   Globulin, Total 2.3 1.5 - 4.5 g/dL   Albumin/Globulin Ratio 2.0 1.2 - 2.2   Bilirubin Total 0.5 0.0 - 1.2 mg/dL   Alkaline Phosphatase 88 39 - 117 IU/L   AST 16 0 - 40 IU/L   ALT 5 0 - 32 IU/L  Lipid panel  Result Value Ref Range   Cholesterol, Total 173 100 - 199 mg/dL   Triglycerides 110 0 - 149 mg/dL   HDL 51 >39 mg/dL   VLDL Cholesterol Cal 20 5 - 40 mg/dL   LDL Chol Calc (NIH) 102 (H) 0 - 99 mg/dL   Chol/HDL Ratio 3.4 0.0 - 4.4 ratio  TSH  Result Value Ref Range   TSH 5.940 (H) 0.450 - 4.500 uIU/mL  Microalbumin / creatinine urine ratio  Result Value Ref Range   Creatinine, Urine 149.2 Not Estab. mg/dL   Microalbumin, Urine 10.1 Not Estab. ug/mL   Microalb/Creat Ratio 7 0 - 29 mg/g creat      Assessment & Plan:   1. Anxiety - ALPRAZolam (XANAX) 0.5 MG tablet; Take 1 tablet (0.5 mg total) by mouth 2 (two) times daily as needed for anxiety.  Dispense: 60 tablet; Refill: 5 - citalopram (CELEXA) 20 MG tablet; Take 1 tablet (20 mg total) by mouth daily.  Dispense: 90 tablet; Refill: 3 - ToxASSURE Select 13 (MW), Urine  2. Depression, recurrent ([place]) - citalopram (CELEXA) 20 MG tablet; Take 1 tablet (20 mg total) by mouth daily.  Dispense: 90 tablet; Refill: 3  3. Essential hypertension - amLODipine (NORVASC) 5 MG  tablet; TAKE ONE (1) TABLET EACH DAY  Dispense: 90 tablet; Refill: 1 - CBC with Differential/Platelet - CMP14+EGFR - Lipid panel - Microalbumin / creatinine urine ratio  4. Actinic keratoses - hydrocortisone 2.5 % cream; Apply topically 2 (two) times daily.  Dispense: 453.6 g; Refill: 1  5. Well adult exam - CBC with Differential/Platelet - CMP14+EGFR - Lipid panel - TSH - Microalbumin / creatinine urine ratio   Continue all other maintenance medications as listed above.  Follow up plan: Recheck 6 months  Educational handout given for [place] PA-C [facility] [address]  [place],  [address] [phone]   05/09/2019, 10:02 PM

## 2019-05-11 LAB — TOXASSURE SELECT 13 (MW), URINE

## 2019-11-22 ENCOUNTER — Ambulatory Visit (INDEPENDENT_AMBULATORY_CARE_PROVIDER_SITE_OTHER): Payer: Medicare Other | Admitting: Family

## 2019-11-22 ENCOUNTER — Encounter: Payer: Self-pay | Admitting: Family

## 2019-11-22 ENCOUNTER — Other Ambulatory Visit: Payer: Self-pay

## 2019-11-22 VITALS — BP 110/65 | HR 87 | Temp 98.4°F | Ht 61.0 in | Wt 129.2 lb

## 2019-11-22 DIAGNOSIS — F339 Major depressive disorder, recurrent, unspecified: Secondary | ICD-10-CM

## 2019-11-22 DIAGNOSIS — J432 Centrilobular emphysema: Secondary | ICD-10-CM

## 2019-11-22 DIAGNOSIS — F419 Anxiety disorder, unspecified: Secondary | ICD-10-CM | POA: Diagnosis not present

## 2019-11-22 DIAGNOSIS — I10 Essential (primary) hypertension: Secondary | ICD-10-CM

## 2019-11-22 DIAGNOSIS — J449 Chronic obstructive pulmonary disease, unspecified: Secondary | ICD-10-CM

## 2019-11-22 DIAGNOSIS — Z79899 Other long term (current) drug therapy: Secondary | ICD-10-CM | POA: Diagnosis not present

## 2019-11-22 DIAGNOSIS — Z87891 Personal history of nicotine dependence: Secondary | ICD-10-CM

## 2019-11-22 DIAGNOSIS — F1320 Sedative, hypnotic or anxiolytic dependence, uncomplicated: Secondary | ICD-10-CM

## 2019-11-22 DIAGNOSIS — K219 Gastro-esophageal reflux disease without esophagitis: Secondary | ICD-10-CM

## 2019-11-22 DIAGNOSIS — K5900 Constipation, unspecified: Secondary | ICD-10-CM

## 2019-11-22 DIAGNOSIS — R7989 Other specified abnormal findings of blood chemistry: Secondary | ICD-10-CM

## 2019-11-22 MED ORDER — ALBUTEROL SULFATE HFA 108 (90 BASE) MCG/ACT IN AERS: 2.0000 | INHALATION_SPRAY | Freq: Four times a day (QID) | RESPIRATORY_TRACT | 2 refills | Status: DC

## 2019-11-22 MED ORDER — AMLODIPINE BESYLATE 5 MG PO TABS: ORAL_TABLET | ORAL | 1 refills | Status: DC

## 2019-11-22 MED ORDER — OMEPRAZOLE 20 MG PO CPDR: 20.0000 mg | DELAYED_RELEASE_CAPSULE | Freq: Every day | ORAL | 11 refills | Status: DC

## 2019-11-22 MED ORDER — CITALOPRAM HYDROBROMIDE 20 MG PO TABS: 20.0000 mg | ORAL_TABLET | Freq: Every day | ORAL | 3 refills | Status: DC

## 2019-11-22 MED ORDER — BUSPIRONE HCL 5 MG PO TABS: 5.0000 mg | ORAL_TABLET | Freq: Three times a day (TID) | ORAL | 2 refills | Status: DC | PRN

## 2019-11-22 MED ORDER — ALPRAZOLAM 0.5 MG PO TABS: 0.5000 mg | ORAL_TABLET | Freq: Two times a day (BID) | ORAL | 5 refills | Status: DC | PRN

## 2019-11-22 NOTE — Progress Notes (Signed)
Subjective:    Patient ID: Kelly Peters, female    DOB: 21-Apr-1930, 86 y.o.   MRN: [id number]  Chief Complaint  Patient presents with  . Medical Management of Chronic Issues    [name]    PT presents to the office today to establish care with me.  Hypertension This is a chronic problem. The current episode started more than 1 year ago. The problem has been resolved since onset. The problem is controlled. Associated symptoms include anxiety, malaise/fatigue and peripheral edema ("if I stand too long"). Pertinent negatives include no shortness of breath. Risk factors for coronary artery disease include obesity and sedentary lifestyle. The current treatment provides moderate improvement.  Anxiety Presents for follow-up visit. Symptoms include depressed mood, excessive worry, irritability, nervous/anxious behavior and restlessness. Patient reports no shortness of breath. The severity of symptoms is moderate. The quality of sleep is good.    Constipation This is a chronic problem. The current episode started more than 1 year ago. The problem has been waxing and waning since onset. She has tried laxatives for the symptoms. The treatment provided moderate relief.  Gastroesophageal Reflux She complains of belching and heartburn. This is a chronic problem. The current episode started more than 1 year ago. The problem occurs occasionally. The problem has been waxing and waning. She has tried a PPI for the symptoms. The treatment provided moderate relief.    COPD PT states her breathing is stable. Quit smoking 07/26/2018.   Review of Systems  Constitutional: Positive for irritability and malaise/fatigue.  Respiratory: Negative for shortness of breath.   Gastrointestinal: Positive for constipation and heartburn.  Psychiatric/Behavioral: The patient is nervous/anxious.   All other systems reviewed and are negative.      Objective:   Physical Exam Vitals reviewed.  Constitutional:        General: She is not in acute distress.    Appearance: She is well-developed.  HENT:     Head: Normocephalic and atraumatic.     Right Ear: Tympanic membrane normal.     Left Ear: Tympanic membrane normal.  Eyes:     Pupils: Pupils are equal, round, and reactive to light.  Neck:     Thyroid: No thyromegaly.  Cardiovascular:     Rate and Rhythm: Normal rate and regular rhythm.     Heart sounds: Normal heart sounds. No murmur.  Pulmonary:     Effort: Pulmonary effort is normal. No respiratory distress.     Breath sounds: Decreased breath sounds present. No wheezing.  Abdominal:     General: Bowel sounds are normal. There is no distension.     Palpations: Abdomen is soft.     Tenderness: There is no abdominal tenderness.  Musculoskeletal:        General: No tenderness. Normal range of motion.     Cervical back: Normal range of motion and neck supple.     Right lower leg: Edema (trace) present.     Left lower leg: Edema (trace) present.  Skin:    General: Skin is warm and dry.  Neurological:     Mental Status: She is alert and oriented to person, place, and time.     Cranial Nerves: No cranial nerve deficit.     Deep Tendon Reflexes: Reflexes are normal and symmetric.  Psychiatric:        Behavior: Behavior normal.        Thought Content: Thought content normal.        Judgment: Judgment normal.  BP 110/65   Pulse 87   Temp 98.4 F (36.9 C) (Temporal)   Ht '5\' 1"'  (1.549 m)   Wt 129 lb 3.2 oz (58.6 kg)   SpO2 94%   BMI 24.41 kg/m      Assessment & Plan:  [name] comes in today with chief complaint of Medical Management of Chronic Issues [name] patient )   Diagnosis and orders addressed:  1. Anxiety Will decrease xanax to #45 from #60 Will add buspar 5 mg TID prn as needed Goal to d/c xanax - ALPRAZolam (XANAX) 0.5 MG tablet; Take 1 tablet (0.5 mg total) by mouth 2 (two) times daily as needed for anxiety.  Dispense: 45 tablet; Refill: 5 -  citalopram (CELEXA) 20 MG tablet; Take 1 tablet (20 mg total) by mouth daily.  Dispense: 90 tablet; Refill: 3 - CMP14+EGFR - CBC with Differential/Platelet  2. Depression, recurrent ([place]) - citalopram (CELEXA) 20 MG tablet; Take 1 tablet (20 mg total) by mouth daily.  Dispense: 90 tablet; Refill: 3 - CMP14+EGFR - CBC with Differential/Platelet  3. Centrilobular emphysema (HCC) - albuterol (VENTOLIN HFA) 108 (90 Base) MCG/ACT inhaler; Inhale 2 puffs into the lungs QID.  Dispense: 8 g; Refill: 2 - CMP14+EGFR - CBC with Differential/Platelet  4. Essential hypertension - amLODipine (NORVASC) 5 MG tablet; TAKE ONE (1) TABLET EACH DAY  Dispense: 90 tablet; Refill: 1 - CMP14+EGFR - CBC with Differential/Platelet  5. Moderate COPD (chronic obstructive pulmonary disease) (H[facility]) - CMP14+EGFR - CBC with Differential/Platelet  6. Constipation, unspecified constipation type - CMP14+EGFR - CBC with Differential/Platelet  7. History of tobacco abuse - CMP14+EGFR - CBC with Differential/Platelet  8. Gastroesophageal reflux disease, unspecified whether esophagitis present - CMP14+EGFR - CBC with Differential/Platelet  9. Benzodiazepine dependence (HCC) - CMP14+EGFR - CBC with Differential/Platelet - ToxASSURE Select 13 (MW), Urine  10. Controlled substance agreement signed  - CMP14+EGFR - CBC with Differential/Platelet - ToxASSURE Select 13 (MW), Urine  11. Abnormal TSH - TSH   Labs pending Pt reviewed in [place] controlled database- No red flags, contract and drug screen up dated today Health Maintenance reviewed Diet and exercise encouraged  Follow up plan: 4 months    [name], FNP

## 2019-11-22 NOTE — Patient Instructions (Signed)

## 2019-11-23 LAB — CMP14+EGFR
ALT: 6 IU/L (ref 0–32)
AST: 15 IU/L (ref 0–40)
Albumin/Globulin Ratio: 1.9 (ref 1.2–2.2)
Albumin: 4.5 g/dL (ref 3.6–4.6)
Alkaline Phosphatase: 126 IU/L — ABNORMAL HIGH (ref 39–117)
BUN/Creatinine Ratio: 16 (ref 12–28)
BUN: 23 mg/dL (ref 8–27)
Bilirubin Total: 0.3 mg/dL (ref 0.0–1.2)
CO2: 24 mmol/L (ref 20–29)
Calcium: 8.9 mg/dL (ref 8.7–10.3)
Chloride: 101 mmol/L (ref 96–106)
Creatinine, Ser: 1.48 mg/dL — ABNORMAL HIGH (ref 0.57–1.00)
GFR calc Af Amer: 36 mL/min/{1.73_m2} — ABNORMAL LOW (ref >59)
GFR calc non Af Amer: 32 mL/min/{1.73_m2} — ABNORMAL LOW (ref >59)
Globulin, Total: 2.4 g/dL (ref 1.5–4.5)
Glucose: 112 mg/dL — ABNORMAL HIGH (ref 65–99)
Potassium: 4.5 mmol/L (ref 3.5–5.2)
Sodium: 137 mmol/L (ref 134–144)
Total Protein: 6.9 g/dL (ref 6.0–8.5)

## 2019-11-23 LAB — CBC WITH DIFFERENTIAL/PLATELET
Basophils Absolute: 0.1 10*3/uL (ref 0.0–0.2)
Basos: 1 %
EOS (ABSOLUTE): 0.2 10*3/uL (ref 0.0–0.4)
Eos: 3 %
Hematocrit: 34.6 % (ref 34.0–46.6)
Hemoglobin: 11.1 g/dL (ref 11.1–15.9)
Immature Grans (Abs): 0 10*3/uL (ref 0.0–0.1)
Immature Granulocytes: 0 %
Lymphocytes Absolute: 1.6 10*3/uL (ref 0.7–3.1)
Lymphs: 24 %
MCH: 30.7 pg (ref 26.6–33.0)
MCHC: 32.1 g/dL (ref 31.5–35.7)
MCV: 96 fL (ref 79–97)
Monocytes Absolute: 0.6 10*3/uL (ref 0.1–0.9)
Monocytes: 10 %
Neutrophils Absolute: 4.1 10*3/uL (ref 1.4–7.0)
Neutrophils: 62 %
Platelets: 219 10*3/uL (ref 150–450)
RBC: 3.62 x10E6/uL — ABNORMAL LOW (ref 3.77–5.28)
RDW: 12.3 % (ref 11.7–15.4)
WBC: 6.5 10*3/uL (ref 3.4–10.8)

## 2019-11-23 LAB — TSH: TSH: 3.75 u[IU]/mL (ref 0.450–4.500)

## 2019-11-24 LAB — TOXASSURE SELECT 13 (MW), URINE

## 2019-12-15 ENCOUNTER — Ambulatory Visit (INDEPENDENT_AMBULATORY_CARE_PROVIDER_SITE_OTHER): Payer: Medicare Other | Admitting: *Deleted

## 2019-12-15 ENCOUNTER — Telehealth: Payer: Self-pay | Admitting: *Deleted

## 2019-12-15 VITALS — BP 125/65 | HR 71 | Ht 61.0 in | Wt 128.0 lb

## 2019-12-15 DIAGNOSIS — Z0000 Encounter for general adult medical examination without abnormal findings: Secondary | ICD-10-CM

## 2019-12-15 NOTE — Patient Instructions (Signed)
  [facility] Maintenance Summary and Written Plan of Care  Ms. Stephens November ,  Thank you for allowing me to perform your Medicare Annual Wellness Visit and for your ongoing commitment to your health.   Health Maintenance & Immunization History Health Maintenance  Topic Date Due  . COVID-19 Vaccine (1) Never done  . DEXA SCAN  05/05/2020 (Originally 01/29/1997)  . TETANUS/TDAP  05/05/2020 (Originally 01/30/1949)  . PNA vac Low Risk Adult (1 of 2 - PCV13) 05/05/2020 (Originally 01/29/1997)  . INFLUENZA VACCINE  02/19/2020   Immunization History  Administered Date(s) Administered  . Influenza, High Dose Seasonal PF 04/21/2018    These are the patient goals that we discussed: Goals Addressed            This Visit's Progress   . Exercise 3x per week (30 min per time)       12/15/2019 AWV Goal: Exercise for General Health   Patient will verbalize understanding of the benefits of increased physical activity:  Exercising regularly is important. It will improve your overall fitness, flexibility, and endurance.  Regular exercise also will improve your overall health. It can help you control your weight, reduce stress, and improve your bone density.  Over the next year, patient will increase physical activity as tolerated with a goal of at least 150 minutes of moderate physical activity per week.   You can tell that you are exercising at a moderate intensity if your heart starts beating faster and you start breathing faster but can still hold a conversation.  Moderate-intensity exercise ideas include:  Walking 1 mile (1.6 km) in about 15 minutes  Biking  Hiking  Golfing  Dancing  Water aerobics  Patient will verbalize understanding of everyday activities that increase physical activity by providing examples like the following: ? Yard work, such as: ? Pushing a Conservation officer, nature ? Raking and bagging leaves ? Washing your car ? Pushing a  stroller ? Shoveling snow ? Gardening ? Washing windows or floors  Patient will be able to explain general safety guidelines for exercising:   Before you start a new exercise program, talk with your health care provider.  Do not exercise so much that you hurt yourself, feel dizzy, or get very short of breath.  Wear comfortable clothes and wear shoes with good support.  Drink plenty of water while you exercise to prevent dehydration or heat stroke.  Work out until your breathing and your heartbeat get faster.         This is a list of Health Maintenance Items that are overdue or due now: Health Maintenance Due  Topic Date Due  . COVID-19 Vaccine (1) Never done     Orders/Referrals Placed Today: No orders of the defined types were placed in this encounter.  (Contact our referral department at [phone] if you have not spoken with someone about your referral appointment within the next 5 days)    Follow-up Plan Keep follow up appointment with [name], FNP Try to exercise for at least 30 minutes, 3 times weekly. Bring in Copy of Advance Directive to be placed in chart Try eating a meal before taking Buspirone to see if that helps with Nausea

## 2019-12-15 NOTE — Telephone Encounter (Signed)
Patient aware and verbalizes understanding.  Patient is upset since she has been on this medication for along time.  States she takes it for sleeping and I explained to patient there is other medications that are used to sleep that are not controlled.  Give her the option to talk to [facility] about getting on one.  Patient states that she will think about it or if she needs to go somewhere else.    FYI

## 2019-12-15 NOTE — Telephone Encounter (Signed)
While on the phone with patient for Medicare Wellness Visit.  Patient states that she has stopped Buspirone due to side effects that consist of nausea without vomiting and increased BMs (Not Diarrhea).  Patient states that Buspirone made her feel "deathly sick".  Patient does not eat a meal before or close to time of taking medications.  Encouraged eating a meal before taking buspirone may help with the side effects, patient declined and states that she does not want to feel "deathly sick" and just wants to take her alprazolam and that she has been on it "forever" and it worked.   Patient states that she is taking alprazolam 1 tablet nightly at 9pm and 1 tablet during the day and has 12 tablets left.  Patient also states that she is taking the celexa.  But felt better when taking the alprazolam.

## 2019-12-15 NOTE — Telephone Encounter (Signed)
I can not refill xanax early. She needs to start tampering down. Given her age and COPD we can not continue this medication long term. I am sorry, but this medication can effect your breathing and is very dependent.

## 2019-12-15 NOTE — Progress Notes (Addendum)
MEDICARE ANNUAL WELLNESS VISIT  12/15/2019  Telephone Visit Disclaimer This Medicare AWV was conducted by telephone due to national recommendations for restrictions regarding the COVID-19 Pandemic (e.g. social distancing).  I verified, using two identifiers, that I am speaking with Kelly Peters or their authorized healthcare agent. I discussed the limitations, risks, security, and privacy concerns of performing an evaluation and management service by telephone and the potential availability of an in-person appointment in the future. The patient expressed understanding and agreed to proceed.   Subjective:  Kelly Peters is a 86 y.o. female patient of Hawks, Kelly Hawthorne, FNP who had a Medicare Annual Wellness Visit today via telephone. [name] is Retired and lives alone. she has 3 children. she reports that she is socially active and does interact with friends/family regularly. she is not physically active and enjoys word search books.  Patient Care Team: Sharion Balloon, FNP as PCP - General (Family Medicine)  Advanced Directives 12/15/2019 12/14/2018 04/14/2018 04/13/2018 06/23/2017 10/03/2016 09/30/2016  Does Patient Have a Medical Advance Directive? Yes Yes - No Yes Yes No  Type of Paramedic of [place];Living will Living will - - Living will Living will -  Does patient want to make changes to medical advance directive? No - Patient declined No - Patient declined - - No - Patient declined No - Patient declined -  Copy of [place] in Chart? No - copy requested - - - - - -  Would patient like information on creating a medical advance directive? No - Patient declined - No - Patient declined - - No - Patient declined -    Hospital Utilization Over the Past 12 Months: # of hospitalizations or ER visits: 0 # of surgeries: 0  Review of Systems    Patient reports that her overall health is unchanged compared to last year.  History obtained from  chart review and the patient General ROS: negative  Patient Reported Readings (BP, Pulse, CBG, Weight, etc) Weight- 128lbs BP- 125/65 P- 71  Pain Assessment Pain : No/denies pain     Current Medications & Allergies (verified) Allergies as of 12/15/2019       Reactions   Penicillins Rash   Has patient had a PCN reaction causing immediate rash, facial/tongue/throat swelling, SOB or lightheadedness with hypotension:No Has patient had a PCN reaction causing severe rash involving mucus membranes or skin necrosis:Yes Has patient had a PCN reaction that required hospitalization:No Has patient had a PCN reaction occurring within the last 10 years:No If all of the above answers are "NO", then may proceed with Cephalosporin use.        Medication List        Accurate as of Dec 15, 2019  9:05 AM. If you have any questions, ask your nurse or doctor.          albuterol 108 (90 Base) MCG/ACT inhaler Commonly known as: VENTOLIN HFA Inhale 2 puffs into the lungs QID.   ALPRAZolam 0.5 MG tablet Commonly known as: XANAX Take 1 tablet (0.5 mg total) by mouth 2 (two) times daily as needed for anxiety.   amLODipine 5 MG tablet Commonly known as: NORVASC TAKE ONE (1) TABLET EACH DAY   aspirin EC 81 MG tablet Take 81 mg by mouth daily.   busPIRone 5 MG tablet Commonly known as: BUSPAR Take 1 tablet (5 mg total) by mouth 3 (three) times daily as needed.   citalopram 20 MG tablet Commonly known  as: CELEXA Take 1 tablet (20 mg total) by mouth daily.   omeprazole 20 MG capsule Commonly known as: PRILOSEC Take 1 capsule (20 mg total) by mouth daily.   polyethylene glycol 17 g packet Commonly known as: MIRALAX / GLYCOLAX Take 17 g by mouth daily as needed for moderate constipation.   senna 8.6 MG Tabs tablet Commonly known as: SENOKOT Take 1 tablet (8.6 mg total) by mouth at bedtime.        History (reviewed): Past Medical History:  Diagnosis Date   Allergy     Anxiety    COPD (chronic obstructive pulmonary disease) ([facility])    Depression    Hypertension    Past Surgical History:  Procedure Laterality Date   ABDOMINAL HYSTERECTOMY     CATARACT EXTRACTION W/PHACO Left 10/03/2016   Procedure: CATARACT EXTRACTION PHACO AND INTRAOCULAR LENS PLACEMENT LEFT EYE CDE - 25.42;  Surgeon: Baruch Goldmann, MD;  Location: [facility];  Service: Ophthalmology;  Laterality: Left;  left   CATARACT EXTRACTION W/PHACO Right 10/24/2016   Procedure: CATARACT EXTRACTION PHACO AND INTRAOCULAR LENS PLACEMENT RIGHT EYE CDE= 24.99;  Surgeon: Baruch Goldmann, MD;  Location: [facility];  Service: Ophthalmology;  Laterality: Right;  right   Family History  Problem Relation Age of Onset   Stroke Mother    Heart attack Father    Heart attack Brother    Diabetes Sister    Stroke Sister    COPD Daughter    Hypertension Daughter    Heart disease Son    Hypertension Son    Cancer Brother        kidney cancer   Stroke Brother    COPD Brother    Diabetes Sister    Thyroid disease Daughter    Social History   Socioeconomic History   Marital status: Widowed    Spouse name: Not on file   Number of children: 3   Years of education: 7   Highest education level: 7th grade  Occupational History   Occupation: Retired  Tobacco Use   Smoking status: Former Smoker    Packs/day: 0.50    Years: 58.00    Pack years: 29.00    Quit date: 07/23/2018    Years since quitting: 1.3   Smokeless tobacco: Never Used   Tobacco comment: down to 4-5 cigarettes a day 05/12/18  Substance and Sexual Activity   Alcohol use: Not Currently   Drug use: Never   Sexual activity: Not Currently    Birth control/protection: Surgical  Other Topics Concern   Not on file  Social History Narrative   Not on file   Social Determinants of Health   Financial Resource Strain: Low Risk    Difficulty of Paying Living Expenses: Not hard at all  Food Insecurity: No Food Insecurity   Worried About Charity fundraiser  in the Last Year: Never true   [place] in the Last Year: Never true  Transportation Needs: No Transportation Needs   Lack of Transportation (Medical): No   Lack of Transportation (Non-Medical): No  Physical Activity: Inactive   Days of Exercise per Week: 0 days   Minutes of Exercise per Session: 0 min  Stress: No Stress Concern Present   Feeling of Stress : Only a little  Social Connections: Moderately Isolated   Frequency of Communication with Friends and Family: More than three times a week   Frequency of Social Gatherings with Friends and Family: More than three times a week  Attends Religious Services: Never   Active Member of Clubs or Organizations: No   Attends Archivist Meetings: Never   Marital Status: Widowed    Activities of Daily Living In your present state of health, do you have any difficulty performing the following activities: 12/15/2019  Hearing? Y  Vision? N  Difficulty concentrating or making decisions? N  Walking or climbing stairs? Y  Dressing or bathing? N  Doing errands, shopping? N  Preparing Food and eating ? N  Using the Toilet? N  In the past six months, have you accidently leaked urine? N  Do you have problems with loss of bowel control? N  Managing your Medications? N  Managing your Finances? N  Housekeeping or managing your Housekeeping? N  Some recent data might be hidden    Patient Education/ Literacy How often do you need to have someone help you when you read instructions, pamphlets, or other written materials from your doctor or pharmacy?: 1 - Never What is the last grade level you completed in school?: 7th Grade  Exercise Current Exercise Habits: The patient does not participate in regular exercise at present, Exercise limited by: None identified  Diet Patient reports consuming 2 meals a day and 4 snack(s) a day Patient reports that her primary diet is: Regular Patient reports that she does have regular access to  food.   Depression Screen PHQ 2/9 Scores 12/15/2019 11/22/2019 05/06/2019 12/14/2018 05/19/2018 04/21/2018 04/08/2018  PHQ - 2 Score 4 4 0 0 0 0 0  PHQ- 9 Score 10 6 - - - - -     Fall Risk Fall Risk  12/15/2019 11/22/2019 05/06/2019 12/14/2018 05/19/2018  Falls in the past year? 0 0 0 0 No  Number falls in past yr: 0 - - - -  Injury with Fall? 0 - - - -  Risk for fall due to : No Fall Risks - - - -  Follow up Falls evaluation completed - - - -     Objective:  Kelly Peters seemed alert and oriented and she participated appropriately during our telephone visit.  Blood Pressure Weight BMI  BP Readings from Last 3 Encounters:  12/15/19 125/65  11/22/19 110/65  05/06/19 133/75   Wt Readings from Last 3 Encounters:  12/15/19 128 lb (58.1 kg)  11/22/19 129 lb 3.2 oz (58.6 kg)  05/06/19 124 lb 12.8 oz (56.6 kg)   BMI Readings from Last 1 Encounters:  12/15/19 24.19 kg/m    *Unable to obtain current vital signs, weight, and BMI due to telephone visit type  Hearing/Vision  [place] did  seem to have difficulty with hearing/understanding during the telephone conversation Reports that she has not had a formal eye exam by an eye care professional within the past year Reports that she has not had a formal hearing evaluation within the past year *Unable to fully assess hearing and vision during telephone visit type  Cognitive Function: 6CIT Screen 12/15/2019 12/14/2018  What Year? 0 points 0 points  What month? 0 points 0 points  What time? 0 points 0 points  Count back from 20 0 points 2 points  Months in reverse 0 points 0 points  Repeat phrase 2 points 2 points  Total Score 2 4   (Normal:0-7, Significant for Dysfunction: >8)  Normal Cognitive Function Screening: Yes   Immunization & Health Maintenance Record Immunization History  Administered Date(s) Administered   Influenza, High Dose Seasonal PF 04/21/2018    Health Maintenance  Topic  Date Due   DEXA SCAN  05/05/2020  (Originally [birth year]/1997)   TETANUS/TDAP  05/05/2020 (Originally 01/30/1949)   PNA vac Low Risk Adult (1 of 2 - PCV13) 05/05/2020 (Originally [birth year]/1997)   INFLUENZA VACCINE  02/19/2020   COVID-19 Vaccine  Discontinued       Assessment  This is a routine wellness examination for Kelly Peters.  Health Maintenance: Due or Overdue There are no preventive care reminders to display for this patient.  Kelly Peters does not need a referral for [employer] Assistance: Care Management:   no Social Work:    no Prescription Assistance:  no Nutrition/Diabetes Education:  no   Plan:  Personalized Goals Goals Addressed             This Visit's Progress    Exercise 3x per week (30 min per time)       12/15/2019 AWV Goal: Exercise for General Health  Patient will verbalize understanding of the benefits of increased physical activity: Exercising regularly is important. It will improve your overall fitness, flexibility, and endurance. Regular exercise also will improve your overall health. It can help you control your weight, reduce stress, and improve your bone density. Over the next year, patient will increase physical activity as tolerated with a goal of at least 150 minutes of moderate physical activity per week.  You can tell that you are exercising at a moderate intensity if your heart starts beating faster and you start breathing faster but can still hold a conversation. Moderate-intensity exercise ideas include: Walking 1 mile (1.6 km) in about 15 minutes Biking Hiking Golfing Dancing Water aerobics Patient will verbalize understanding of everyday activities that increase physical activity by providing examples like the following: Yard work, such as: Sales promotion account executive Gardening Washing windows or floors Patient will be able to explain general safety guidelines for exercising:  Before you start a  new exercise program, talk with your health care provider. Do not exercise so much that you hurt yourself, feel dizzy, or get very short of breath. Wear comfortable clothes and wear shoes with good support. Drink plenty of water while you exercise to prevent dehydration or heat stroke. Work out until your breathing and your heartbeat get faster.        Personalized Health Maintenance & Screening Recommendations  Pneumococcal vaccine  Influenza vaccine Td vaccine Bone densitometry screening Advanced directives: has an advanced directive - a copy HAS NOT been provided.  Lung Cancer Screening Recommended: yes (Low Dose CT Chest recommended if Age 61-80 years, 30 pack-year currently smoking OR have quit w/in past 15 years) Hepatitis C Screening recommended: no HIV Screening recommended: no  Advanced Directives: Written information was not prepared per patient's request.  Referrals & Orders No orders of the defined types were placed in this encounter.   Follow-up Plan Follow-up with Sharion Balloon, FNP as planned Try to exercise for at least 30 minutes, 3 times weekly Increase Water intake    I have personally reviewed and noted the following in the patient's chart:   Medical and social history Use of alcohol, tobacco or illicit drugs  Current medications and supplements Functional ability and status Nutritional status Physical activity Advanced directives List of other physicians Hospitalizations, surgeries, and ER visits in previous 12 months Vitals Screenings to include cognitive, depression, and falls Referrals and appointments  In addition, I have reviewed and discussed with Kelly Peters certain preventive  protocols, quality metrics, and best practice recommendations. A written personalized care plan for preventive services as well as general preventive health recommendations is available and can be mailed to the patient at her request.      Wardell Heath,  LPN  [id number]   AVS printed and Mailed to Patient   I have reviewed and agree with the above AWV documentation.   [name], FNP

## 2020-03-23 ENCOUNTER — Encounter: Payer: Self-pay | Admitting: Family

## 2020-03-23 ENCOUNTER — Other Ambulatory Visit: Payer: Self-pay

## 2020-03-23 ENCOUNTER — Ambulatory Visit (INDEPENDENT_AMBULATORY_CARE_PROVIDER_SITE_OTHER): Payer: Medicare Other | Admitting: Family

## 2020-03-23 VITALS — BP 124/70 | HR 76 | Temp 97.8°F | Ht 61.0 in | Wt 128.2 lb

## 2020-03-23 DIAGNOSIS — G4700 Insomnia, unspecified: Secondary | ICD-10-CM

## 2020-03-23 DIAGNOSIS — K5900 Constipation, unspecified: Secondary | ICD-10-CM | POA: Diagnosis not present

## 2020-03-23 DIAGNOSIS — J449 Chronic obstructive pulmonary disease, unspecified: Secondary | ICD-10-CM | POA: Diagnosis not present

## 2020-03-23 DIAGNOSIS — F339 Major depressive disorder, recurrent, unspecified: Secondary | ICD-10-CM

## 2020-03-23 DIAGNOSIS — Z87891 Personal history of nicotine dependence: Secondary | ICD-10-CM

## 2020-03-23 DIAGNOSIS — I10 Essential (primary) hypertension: Secondary | ICD-10-CM | POA: Diagnosis not present

## 2020-03-23 DIAGNOSIS — F1320 Sedative, hypnotic or anxiolytic dependence, uncomplicated: Secondary | ICD-10-CM | POA: Insufficient documentation

## 2020-03-23 DIAGNOSIS — Z79899 Other long term (current) drug therapy: Secondary | ICD-10-CM

## 2020-03-23 DIAGNOSIS — F419 Anxiety disorder, unspecified: Secondary | ICD-10-CM

## 2020-03-23 MED ORDER — SENNA 8.6 MG PO TABS: 1.0000 | ORAL_TABLET | Freq: Every day | ORAL | Status: DC

## 2020-03-23 MED ORDER — OMEPRAZOLE 20 MG PO CPDR: 20.0000 mg | DELAYED_RELEASE_CAPSULE | Freq: Every day | ORAL | 11 refills | Status: DC

## 2020-03-23 MED ORDER — CITALOPRAM HYDROBROMIDE 20 MG PO TABS: 20.0000 mg | ORAL_TABLET | Freq: Every day | ORAL | 3 refills | Status: DC

## 2020-03-23 MED ORDER — ALPRAZOLAM 0.5 MG PO TABS: 0.5000 mg | ORAL_TABLET | Freq: Two times a day (BID) | ORAL | 2 refills | Status: DC | PRN

## 2020-03-23 MED ORDER — AMLODIPINE BESYLATE 5 MG PO TABS: ORAL_TABLET | ORAL | 1 refills | Status: DC

## 2020-03-23 MED ORDER — DOXEPIN HCL 10 MG PO CAPS: 10.0000 mg | ORAL_CAPSULE | Freq: Every day | ORAL | 1 refills | Status: DC

## 2020-03-23 NOTE — Patient Instructions (Signed)

## 2020-03-23 NOTE — Progress Notes (Signed)
Subjective:    Patient ID: Kelly Peters, female    DOB: [birth year], 86 y.o.   MRN: [id number]  Chief Complaint  Patient presents with  . Medical Management of Chronic Issues   PT presents to the office today for chronic follow up.  Hypertension This is a chronic problem. The current episode started more than 1 year ago. The problem has been resolved since onset. The problem is controlled. Associated symptoms include anxiety. Pertinent negatives include no malaise/fatigue, peripheral edema or shortness of breath. Risk factors for coronary artery disease include dyslipidemia and sedentary lifestyle. The current treatment provides moderate improvement. There is no history of CVA or heart failure.  Depression        This is a chronic problem.  The current episode started more than 1 year ago.   The onset quality is gradual.   The problem occurs intermittently.  Associated symptoms include insomnia, irritable, restlessness and sad.  Associated symptoms include no helplessness and no hopelessness.  Past treatments include SSRIs - Selective serotonin reuptake inhibitors.  Compliance with treatment is good.  Past medical history includes anxiety.   Anxiety Presents for follow-up visit. Symptoms include depressed mood, insomnia, irritability, nervous/anxious behavior and restlessness. Patient reports no shortness of breath. The quality of sleep is good.    Constipation This is a chronic problem. The current episode started more than 1 year ago. The problem has been waxing and waning since onset. Her stool frequency is 1 time per day. She has tried laxatives for the symptoms. The treatment provided moderate relief.  Insomnia Primary symptoms: difficulty falling asleep, frequent awakening, no malaise/fatigue.  The current episode started more than one year. PMH includes: depression.  COPD PT states her breathing is stable. Quit smoking 07/26/2018.     Review of Systems  Constitutional: Positive  for irritability. Negative for malaise/fatigue.  Respiratory: Negative for shortness of breath.   Gastrointestinal: Positive for constipation.  Psychiatric/Behavioral: Positive for depression. The patient is nervous/anxious and has insomnia.   All other systems reviewed and are negative.      Objective:   Physical Exam Vitals reviewed.  Constitutional:      General: She is irritable. She is not in acute distress.    Appearance: She is well-developed.  HENT:     Head: Normocephalic and atraumatic.     Right Ear: Tympanic membrane normal.     Left Ear: Tympanic membrane normal.  Eyes:     Pupils: Pupils are equal, round, and reactive to light.  Neck:     Thyroid: No thyromegaly.  Cardiovascular:     Rate and Rhythm: Normal rate and regular rhythm.     Heart sounds: Normal heart sounds. No murmur heard.   Pulmonary:     Effort: Pulmonary effort is normal. No respiratory distress.     Breath sounds: Normal breath sounds. No wheezing.  Abdominal:     General: Bowel sounds are normal. There is no distension.     Palpations: Abdomen is soft.     Tenderness: There is no abdominal tenderness.  Musculoskeletal:        General: No tenderness. Normal range of motion.     Cervical back: Normal range of motion and neck supple.  Skin:    General: Skin is warm and dry.  Neurological:     Mental Status: She is alert and oriented to person, place, and time.     Cranial Nerves: No cranial nerve deficit.     Deep Tendon  Reflexes: Reflexes are normal and symmetric.  Psychiatric:        Behavior: Behavior normal.        Thought Content: Thought content normal.        Judgment: Judgment normal.       BP 124/70   Pulse 76   Temp 97.8 F (36.6 C) (Temporal)   Ht 5\' 1"  (1.549 m)   Wt 128 lb 3.2 oz (58.2 kg)   SpO2 95%   BMI 24.22 kg/m      Assessment & Plan:  Kelly Peters comes in today with chief complaint of Medical Management of Chronic Issues   Diagnosis and orders  addressed:  1. Anxiety - ALPRAZolam (XANAX) 0.5 MG tablet; Take 1 tablet (0.5 mg total) by mouth 2 (two) times daily as needed for anxiety.  Dispense: 45 tablet; Refill: 2 - citalopram (CELEXA) 20 MG tablet; Take 1 tablet (20 mg total) by mouth daily.  Dispense: 90 tablet; Refill: 3  2. Depression, recurrent ([place]) - citalopram (CELEXA) 20 MG tablet; Take 1 tablet (20 mg total) by mouth daily.  Dispense: 90 tablet; Refill: 3  3. Essential hypertension - amLODipine (NORVASC) 5 MG tablet; TAKE ONE (1) TABLET EACH DAY  Dispense: 90 tablet; Refill: 1  4. Constipation, unspecified constipation type - senna (SENOKOT) 8.6 MG TABS tablet; Take 1 tablet (8.6 mg total) by mouth at bedtime.  Dispense: 120 tablet  5. Moderate COPD (chronic obstructive pulmonary disease) ([place])  6. History of tobacco abuse  7. Benzodiazepine dependence ([facility]) - ALPRAZolam (XANAX) 0.5 MG tablet; Take 1 tablet (0.5 mg total) by mouth 2 (two) times daily as needed for anxiety.  Dispense: 45 tablet; Refill: 2  8. Controlled substance agreement signed - ALPRAZolam (XANAX) 0.5 MG tablet; Take 1 tablet (0.5 mg total) by mouth 2 (two) times daily as needed for anxiety.  Dispense: 45 tablet; Refill: 2  9. Insomnia, unspecified type Will start Doxepin today and try to tamper off the xanax  - doxepin (SINEQUAN) 10 MG capsule; Take 1 capsule (10 mg total) by mouth at bedtime.  Dispense: 90 capsule; Refill: 1  Patient reviewed in [place] controlled database, no flags noted. Contract and drug screen are up to date.   Labs pending Health Maintenance reviewed Diet and exercise encouraged  Follow up plan: 3 months    [name], FNP

## 2020-06-28 DIAGNOSIS — K529 Noninfective gastroenteritis and colitis, unspecified: Secondary | ICD-10-CM | POA: Diagnosis not present

## 2020-06-28 DIAGNOSIS — K2901 Acute gastritis with bleeding: Secondary | ICD-10-CM | POA: Diagnosis not present

## 2020-06-28 DIAGNOSIS — R110 Nausea: Secondary | ICD-10-CM | POA: Diagnosis not present

## 2020-07-01 ENCOUNTER — Encounter (HOSPITAL_COMMUNITY): Payer: Self-pay

## 2020-07-01 ENCOUNTER — Emergency Department (HOSPITAL_COMMUNITY): Payer: Medicare Other

## 2020-07-01 ENCOUNTER — Other Ambulatory Visit: Payer: Self-pay

## 2020-07-01 ENCOUNTER — Inpatient Hospital Stay (HOSPITAL_COMMUNITY)
Admission: EM | Admit: 2020-07-01 | Discharge: 2020-07-03 | DRG: 872 | Disposition: A | Discharge status: Home / Self Care | Payer: Medicare Other | Attending: Internal Medicine | Admitting: Internal Medicine

## 2020-07-01 DIAGNOSIS — M47812 Spondylosis without myelopathy or radiculopathy, cervical region: Secondary | ICD-10-CM | POA: Diagnosis not present

## 2020-07-01 DIAGNOSIS — Z66 Do not resuscitate: Secondary | ICD-10-CM | POA: Diagnosis present

## 2020-07-01 DIAGNOSIS — S0181XA Laceration without foreign body of other part of head, initial encounter: Secondary | ICD-10-CM | POA: Diagnosis not present

## 2020-07-01 DIAGNOSIS — E860 Dehydration: Secondary | ICD-10-CM | POA: Diagnosis present

## 2020-07-01 DIAGNOSIS — S199XXA Unspecified injury of neck, initial encounter: Secondary | ICD-10-CM | POA: Diagnosis not present

## 2020-07-01 DIAGNOSIS — S0990XA Unspecified injury of head, initial encounter: Secondary | ICD-10-CM | POA: Diagnosis not present

## 2020-07-01 DIAGNOSIS — Z743 Need for continuous supervision: Secondary | ICD-10-CM | POA: Diagnosis not present

## 2020-07-01 DIAGNOSIS — Z833 Family history of diabetes mellitus: Secondary | ICD-10-CM

## 2020-07-01 DIAGNOSIS — N390 Urinary tract infection, site not specified: Secondary | ICD-10-CM | POA: Diagnosis not present

## 2020-07-01 DIAGNOSIS — J449 Chronic obstructive pulmonary disease, unspecified: Secondary | ICD-10-CM | POA: Diagnosis not present

## 2020-07-01 DIAGNOSIS — Z23 Encounter for immunization: Secondary | ICD-10-CM

## 2020-07-01 DIAGNOSIS — E871 Hypo-osmolality and hyponatremia: Secondary | ICD-10-CM

## 2020-07-01 DIAGNOSIS — Z87891 Personal history of nicotine dependence: Secondary | ICD-10-CM

## 2020-07-01 DIAGNOSIS — R9431 Abnormal electrocardiogram [ECG] [EKG]: Secondary | ICD-10-CM | POA: Diagnosis not present

## 2020-07-01 DIAGNOSIS — Y92009 Unspecified place in unspecified non-institutional (private) residence as the place of occurrence of the external cause: Secondary | ICD-10-CM

## 2020-07-01 DIAGNOSIS — I10 Essential (primary) hypertension: Secondary | ICD-10-CM | POA: Diagnosis not present

## 2020-07-01 DIAGNOSIS — I499 Cardiac arrhythmia, unspecified: Secondary | ICD-10-CM | POA: Diagnosis not present

## 2020-07-01 DIAGNOSIS — N179 Acute kidney failure, unspecified: Secondary | ICD-10-CM | POA: Diagnosis not present

## 2020-07-01 DIAGNOSIS — E876 Hypokalemia: Secondary | ICD-10-CM

## 2020-07-01 DIAGNOSIS — A419 Sepsis, unspecified organism: Secondary | ICD-10-CM | POA: Diagnosis not present

## 2020-07-01 DIAGNOSIS — F419 Anxiety disorder, unspecified: Secondary | ICD-10-CM | POA: Diagnosis present

## 2020-07-01 DIAGNOSIS — N184 Chronic kidney disease, stage 4 (severe): Secondary | ICD-10-CM | POA: Diagnosis not present

## 2020-07-01 DIAGNOSIS — Z20822 Contact with and (suspected) exposure to covid-19: Secondary | ICD-10-CM | POA: Diagnosis present

## 2020-07-01 DIAGNOSIS — E538 Deficiency of other specified B group vitamins: Secondary | ICD-10-CM | POA: Diagnosis present

## 2020-07-01 DIAGNOSIS — W1830XA Fall on same level, unspecified, initial encounter: Secondary | ICD-10-CM | POA: Diagnosis not present

## 2020-07-01 DIAGNOSIS — I129 Hypertensive chronic kidney disease with stage 1 through stage 4 chronic kidney disease, or unspecified chronic kidney disease: Secondary | ICD-10-CM | POA: Diagnosis not present

## 2020-07-01 DIAGNOSIS — Z8051 Family history of malignant neoplasm of kidney: Secondary | ICD-10-CM | POA: Diagnosis not present

## 2020-07-01 DIAGNOSIS — Z825 Family history of asthma and other chronic lower respiratory diseases: Secondary | ICD-10-CM | POA: Diagnosis not present

## 2020-07-01 DIAGNOSIS — R1115 Cyclical vomiting syndrome unrelated to migraine: Secondary | ICD-10-CM | POA: Diagnosis present

## 2020-07-01 DIAGNOSIS — F32A Depression, unspecified: Secondary | ICD-10-CM | POA: Diagnosis present

## 2020-07-01 DIAGNOSIS — R319 Hematuria, unspecified: Secondary | ICD-10-CM | POA: Diagnosis not present

## 2020-07-01 DIAGNOSIS — K219 Gastro-esophageal reflux disease without esophagitis: Secondary | ICD-10-CM | POA: Diagnosis present

## 2020-07-01 DIAGNOSIS — Z7982 Long term (current) use of aspirin: Secondary | ICD-10-CM

## 2020-07-01 DIAGNOSIS — Z823 Family history of stroke: Secondary | ICD-10-CM | POA: Diagnosis not present

## 2020-07-01 DIAGNOSIS — M4312 Spondylolisthesis, cervical region: Secondary | ICD-10-CM | POA: Diagnosis not present

## 2020-07-01 DIAGNOSIS — Z8249 Family history of ischemic heart disease and other diseases of the circulatory system: Secondary | ICD-10-CM

## 2020-07-01 DIAGNOSIS — R58 Hemorrhage, not elsewhere classified: Secondary | ICD-10-CM | POA: Diagnosis not present

## 2020-07-01 DIAGNOSIS — A4152 Sepsis due to Pseudomonas: Secondary | ICD-10-CM | POA: Diagnosis not present

## 2020-07-01 DIAGNOSIS — J90 Pleural effusion, not elsewhere classified: Secondary | ICD-10-CM | POA: Diagnosis not present

## 2020-07-01 DIAGNOSIS — R296 Repeated falls: Secondary | ICD-10-CM | POA: Diagnosis not present

## 2020-07-01 DIAGNOSIS — R112 Nausea with vomiting, unspecified: Secondary | ICD-10-CM | POA: Diagnosis not present

## 2020-07-01 DIAGNOSIS — Z79899 Other long term (current) drug therapy: Secondary | ICD-10-CM

## 2020-07-01 DIAGNOSIS — R0902 Hypoxemia: Secondary | ICD-10-CM | POA: Diagnosis not present

## 2020-07-01 LAB — URINALYSIS, ROUTINE W REFLEX MICROSCOPIC
Bilirubin Urine: NEGATIVE
Glucose, UA: NEGATIVE mg/dL
Ketones, ur: NEGATIVE mg/dL
Nitrite: NEGATIVE
Protein, ur: 100 mg/dL — AB
Specific Gravity, Urine: 1.016 (ref 1.005–1.030)
pH: 5 (ref 5.0–8.0)

## 2020-07-01 LAB — COMPREHENSIVE METABOLIC PANEL
ALT: 17 U/L (ref 0–44)
AST: 28 U/L (ref 15–41)
Albumin: 3.3 g/dL — ABNORMAL LOW (ref 3.5–5.0)
Alkaline Phosphatase: 115 U/L (ref 38–126)
Anion gap: 12 (ref 5–15)
BUN: 55 mg/dL — ABNORMAL HIGH (ref 8–23)
CO2: 24 mmol/L (ref 22–32)
Calcium: 8.8 mg/dL — ABNORMAL LOW (ref 8.9–10.3)
Chloride: 92 mmol/L — ABNORMAL LOW (ref 98–111)
Creatinine, Ser: 2.8 mg/dL — ABNORMAL HIGH (ref 0.44–1.00)
GFR, Estimated: 16 mL/min — ABNORMAL LOW (ref >60)
Glucose, Bld: 169 mg/dL — ABNORMAL HIGH (ref 70–99)
Potassium: 3.1 mmol/L — ABNORMAL LOW (ref 3.5–5.1)
Sodium: 128 mmol/L — ABNORMAL LOW (ref 135–145)
Total Bilirubin: 1.6 mg/dL — ABNORMAL HIGH (ref 0.3–1.2)
Total Protein: 6.8 g/dL (ref 6.5–8.1)

## 2020-07-01 LAB — CBC WITH DIFFERENTIAL/PLATELET
Abs Immature Granulocytes: 0.16 10*3/uL — ABNORMAL HIGH (ref 0.00–0.07)
Basophils Absolute: 0.1 10*3/uL (ref 0.0–0.1)
Basophils Relative: 0 %
Eosinophils Absolute: 0.6 10*3/uL — ABNORMAL HIGH (ref 0.0–0.5)
Eosinophils Relative: 3 %
HCT: 38.6 % (ref 36.0–46.0)
Hemoglobin: 12.5 g/dL (ref 12.0–15.0)
Immature Granulocytes: 1 %
Lymphocytes Relative: 3 %
Lymphs Abs: 0.5 10*3/uL — ABNORMAL LOW (ref 0.7–4.0)
MCH: 30.2 pg (ref 26.0–34.0)
MCHC: 32.4 g/dL (ref 30.0–36.0)
MCV: 93.2 fL (ref 80.0–100.0)
Monocytes Absolute: 1.4 10*3/uL — ABNORMAL HIGH (ref 0.1–1.0)
Monocytes Relative: 8 %
Neutro Abs: 15.3 10*3/uL — ABNORMAL HIGH (ref 1.7–7.7)
Neutrophils Relative %: 85 %
Platelets: 213 10*3/uL (ref 150–400)
RBC: 4.14 MIL/uL (ref 3.87–5.11)
RDW: 13.3 % (ref 11.5–15.5)
WBC: 18 10*3/uL — ABNORMAL HIGH (ref 4.0–10.5)
nRBC: 0 % (ref 0.0–0.2)

## 2020-07-01 LAB — MAGNESIUM
Magnesium: 2.3 mg/dL (ref 1.7–2.4)
Magnesium: 2.1 mg/dL (ref 1.7–2.4)

## 2020-07-01 LAB — RESP PANEL BY RT-PCR (FLU A&B, COVID) ARPGX2
Influenza A by PCR: NEGATIVE
Influenza B by PCR: NEGATIVE
SARS Coronavirus 2 by RT PCR: NEGATIVE

## 2020-07-01 LAB — TSH: TSH: 4.325 u[IU]/mL (ref 0.350–4.500)

## 2020-07-01 LAB — VITAMIN B12: Vitamin B-12: 109 pg/mL — ABNORMAL LOW (ref 180–914)

## 2020-07-01 LAB — PHOSPHORUS: Phosphorus: 2.9 mg/dL (ref 2.5–4.6)

## 2020-07-01 MED ORDER — PANTOPRAZOLE SODIUM 40 MG PO TBEC
40.0000 mg | DELAYED_RELEASE_TABLET | Freq: Every day | ORAL | Status: DC
  Administered 2020-07-02 – 2020-07-03 (×2): 40 mg via ORAL
  Filled 2020-07-01 (×2): qty 1

## 2020-07-01 MED ORDER — ACETAMINOPHEN 325 MG PO TABS: 650.0000 mg | ORAL_TABLET | Freq: Four times a day (QID) | ORAL | Status: DC | PRN

## 2020-07-01 MED ORDER — ONDANSETRON HCL 4 MG/2ML IJ SOLN: 4.0000 mg | Freq: Four times a day (QID) | INTRAMUSCULAR | Status: DC | PRN

## 2020-07-01 MED ORDER — SODIUM CHLORIDE 0.9 % IV SOLN
1.0000 g | INTRAVENOUS | Status: DC
  Administered 2020-07-01 – 2020-07-02 (×2): 1 g via INTRAVENOUS
  Filled 2020-07-01 (×2): qty 10

## 2020-07-01 MED ORDER — DOXEPIN HCL 10 MG PO CAPS
10.0000 mg | ORAL_CAPSULE | Freq: Every day | ORAL | Status: DC
  Administered 2020-07-01 – 2020-07-02 (×2): 10 mg via ORAL
  Filled 2020-07-01 (×3): qty 1

## 2020-07-01 MED ORDER — CITALOPRAM HYDROBROMIDE 20 MG PO TABS
20.0000 mg | ORAL_TABLET | Freq: Every day | ORAL | Status: DC
  Administered 2020-07-02 – 2020-07-03 (×2): 20 mg via ORAL
  Filled 2020-07-01 (×3): qty 1

## 2020-07-01 MED ORDER — CHLORHEXIDINE GLUCONATE CLOTH 2 % EX PADS
6.0000 | MEDICATED_PAD | Freq: Every day | CUTANEOUS | Status: DC
  Administered 2020-07-01 – 2020-07-02 (×2): 6 via TOPICAL

## 2020-07-01 MED ORDER — POTASSIUM CHLORIDE 10 MEQ/100ML IV SOLN
10.0000 meq | INTRAVENOUS | Status: AC
  Administered 2020-07-01 (×3): 10 meq via INTRAVENOUS
  Filled 2020-07-01 (×3): qty 100

## 2020-07-01 MED ORDER — IPRATROPIUM-ALBUTEROL 0.5-2.5 (3) MG/3ML IN SOLN: 3.0000 mL | Freq: Four times a day (QID) | RESPIRATORY_TRACT | Status: DC | PRN

## 2020-07-01 MED ORDER — SODIUM CHLORIDE 0.9 % IV SOLN: INTRAVENOUS | Status: DC

## 2020-07-01 MED ORDER — ACETAMINOPHEN 650 MG RE SUPP: 650.0000 mg | Freq: Four times a day (QID) | RECTAL | Status: DC | PRN

## 2020-07-01 MED ORDER — HEPARIN SODIUM (PORCINE) 5000 UNIT/ML IJ SOLN
5000.0000 [IU] | Freq: Three times a day (TID) | INTRAMUSCULAR | Status: DC
  Administered 2020-07-01 – 2020-07-03 (×5): 5000 [IU] via SUBCUTANEOUS
  Filled 2020-07-01 (×5): qty 1

## 2020-07-01 MED ORDER — SODIUM CHLORIDE 0.9 % IV BOLUS: 1000.0000 mL | Freq: Once | INTRAVENOUS | Status: DC

## 2020-07-01 MED ORDER — CIPROFLOXACIN HCL 250 MG PO TABS: 500.0000 mg | ORAL_TABLET | Freq: Once | ORAL | Status: DC

## 2020-07-01 MED ORDER — SODIUM CHLORIDE 0.9 % IV BOLUS
500.0000 mL | Freq: Once | INTRAVENOUS | Status: AC
  Administered 2020-07-01: 12:00:00 500 mL via INTRAVENOUS

## 2020-07-01 MED ORDER — ONDANSETRON HCL 4 MG PO TABS: 4.0000 mg | ORAL_TABLET | Freq: Four times a day (QID) | ORAL | Status: DC | PRN

## 2020-07-01 MED ORDER — SODIUM CHLORIDE 0.9 % IV BOLUS
500.0000 mL | Freq: Once | INTRAVENOUS | Status: AC
  Administered 2020-07-01: 13:00:00 500 mL via INTRAVENOUS

## 2020-07-01 MED ORDER — ASPIRIN EC 81 MG PO TBEC
81.0000 mg | DELAYED_RELEASE_TABLET | Freq: Every day | ORAL | Status: DC
  Administered 2020-07-02 – 2020-07-03 (×2): 81 mg via ORAL
  Filled 2020-07-01 (×2): qty 1

## 2020-07-01 MED ORDER — ALPRAZOLAM 0.5 MG PO TABS
0.5000 mg | ORAL_TABLET | Freq: Two times a day (BID) | ORAL | Status: DC | PRN
  Administered 2020-07-01 – 2020-07-02 (×2): 0.5 mg via ORAL
  Filled 2020-07-01 (×2): qty 1

## 2020-07-01 MED ORDER — TETANUS-DIPHTH-ACELL PERTUSSIS 5-2.5-18.5 LF-MCG/0.5 IM SUSY
0.5000 mL | PREFILLED_SYRINGE | Freq: Once | INTRAMUSCULAR | Status: AC
  Administered 2020-07-01: 12:00:00 0.5 mL via INTRAMUSCULAR
  Filled 2020-07-01: qty 0.5

## 2020-07-01 MED ORDER — AMLODIPINE BESYLATE 5 MG PO TABS
5.0000 mg | ORAL_TABLET | Freq: Every day | ORAL | Status: DC
  Administered 2020-07-02 – 2020-07-03 (×2): 5 mg via ORAL
  Filled 2020-07-01 (×2): qty 1

## 2020-07-01 NOTE — ED Notes (Signed)
Cleansed head wound and applied bacitracin ointment

## 2020-07-01 NOTE — ED Notes (Signed)
Report called to Jessica

## 2020-07-01 NOTE — H&P (Addendum)
History and Physical    Kelly Peters WPY:[id number] DOB: June 12, 1930 DOA: 07/01/2020  PCP: Sharion Balloon, FNP   Patient coming from: Home  I have personally briefly reviewed patient's old medical records in [place]  Chief Complaint: General malaise, weakness, fall, increased frequency, nausea/vomiting  HPI: Kelly Peters is a 86 y.o. female with medical history significant of COPD, depression/anxiety, hypertension and gastroesophageal flux disease; who presented to the hospital secondary to nausea, vomiting and not feeling well since Wednesday night. Patient expressed increased weakness and general malaise that has triggered to multiple falls on the day of admission. On one of these falls patient ended hitting her head and was brought to the hospital for further evaluation and management. Patient denies any focal weakness, loss of consciousness, chest pain, abdominal pain, fever, chills or shortness of breath. Patient reports decreased appetite, increased frequency (no dysuria) and just feeling weak.  ED Course: Chest x-ray without acute cardiopulmonary process; CT head without acute intracranial abnormality. Patient found to be dehydrated, with hyponatremia, hypokalemia, acute on chronic renal failure, elevated tachycardia and elevated WBCs. Meeting early sepsis criteria in the setting of UTI; patient UA suggesting infection. Cultures were taken, IV antibiotics-started empirically, fluid resuscitation given and TRH contacted to place patient in the hospital for further evaluation and management.  Review of Systems: As per HPI otherwise all other systems reviewed and are negative.  Past Medical History:  Diagnosis Date  . Allergy   . Anxiety   . COPD (chronic obstructive pulmonary disease) ([place])   . Depression   . Hypertension     Past Surgical History:  Procedure Laterality Date  . ABDOMINAL HYSTERECTOMY    . CATARACT EXTRACTION W/PHACO Left 10/03/2016   Procedure:  CATARACT EXTRACTION PHACO AND INTRAOCULAR LENS PLACEMENT LEFT EYE CDE - 25.42;  Surgeon: Baruch Goldmann, MD;  Location: [facility];  Service: Ophthalmology;  Laterality: Left;  left  . CATARACT EXTRACTION W/PHACO Right 10/24/2016   Procedure: CATARACT EXTRACTION PHACO AND INTRAOCULAR LENS PLACEMENT RIGHT EYE CDE= 24.99;  Surgeon: Baruch Goldmann, MD;  Location: [facility];  Service: Ophthalmology;  Laterality: Right;  right    Social History  reports that she quit smoking about 23 months ago. She has a 29.00 pack-year smoking history. She has never used smokeless tobacco. She reports previous alcohol use. She reports that she does not use drugs.  Allergies  Allergen Reactions  . Penicillins Rash    Has patient had a PCN reaction causing immediate rash, facial/tongue/throat swelling, SOB or lightheadedness with hypotension:No Has patient had a PCN reaction causing severe rash involving mucus membranes or skin necrosis:Yes Has patient had a PCN reaction that required hospitalization:No Has patient had a PCN reaction occurring within the last 10 years:No If all of the above answers are "NO", then may proceed with Cephalosporin use.     Family History  Problem Relation Age of Onset  . Stroke Mother   . Heart attack Father   . Heart attack Brother   . Diabetes Sister   . Stroke Sister   . COPD Daughter   . Hypertension Daughter   . Heart disease Son   . Hypertension Son   . Cancer Brother        kidney cancer  . Stroke Brother   . COPD Brother   . Diabetes Sister   . Thyroid disease Daughter     Prior to Admission medications   Medication Sig Start Date End Date Taking? Authorizing Provider  albuterol (  VENTOLIN HFA) 108 (90 Base) MCG/ACT inhaler Inhale 2 puffs into the lungs QID. 11/22/19  Yes Hawks, Christy A, FNP  ALPRAZolam (XANAX) 0.5 MG tablet Take 1 tablet (0.5 mg total) by mouth 2 (two) times daily as needed for anxiety. 03/23/20  Yes Hawks, Christy A, FNP  amLODipine (NORVASC) 5 MG tablet  TAKE ONE (1) TABLET EACH DAY Patient taking differently: Take 5 mg by mouth daily. 03/23/20  Yes Hawks, Christy A, FNP  aspirin EC 81 MG tablet Take 81 mg by mouth daily.   Yes [provider]  citalopram (CELEXA) 20 MG tablet Take 1 tablet (20 mg total) by mouth daily. 03/23/20  Yes Hawks, Christy A, FNP  doxepin (SINEQUAN) 10 MG capsule Take 1 capsule (10 mg total) by mouth at bedtime. 03/23/20  Yes Hawks, Christy A, FNP  hyoscyamine (ANASPAZ) 0.125 MG TBDP disintergrating tablet Take 0.125 mg by mouth every 4 (four) hours as needed for other. 06/28/20  Yes [provider]  omeprazole (PRILOSEC) 20 MG capsule Take 1 capsule (20 mg total) by mouth daily. 03/23/20  Yes Hawks, Christy A, FNP  ondansetron (ZOFRAN-ODT) 4 MG disintegrating tablet Take 4 mg by mouth every 8 (eight) hours as needed for nausea/vomiting. 06/28/20 07/05/20 Yes [provider]  polyethylene glycol (MIRALAX / GLYCOLAX) packet Take 17 g by mouth daily as needed for moderate constipation.    Yes [provider]  sucralfate (CARAFATE) 1 g tablet Take 1 g by mouth in the morning, at noon, in the evening, and at bedtime. 06/28/20 07/05/20 Yes [provider]  senna (SENOKOT) 8.6 MG TABS tablet Take 1 tablet (8.6 mg total) by mouth at bedtime. Patient not taking: No sig reported 03/23/20   Sharion Balloon, FNP    Physical Exam: Vitals:   07/01/20 1419 07/01/20 1430 07/01/20 1500 07/01/20 1530  BP: (!) 146/70 137/85 (!) 138/98 (!) 162/113  Pulse: 90 90 90 94  Resp: (!) 25 (!) 23 18 (!) 22  Temp:  98.2 F (36.8 C)    TempSrc:  Oral    SpO2: 94% 95% 95% 95%  Weight:      Height:        Constitutional: Chronically ill in appearance; patient reports feeling weak and tired. No chest pain, slightly nauseous but no vomiting. Vitals:   07/01/20 1419 07/01/20 1430 07/01/20 1500 07/01/20 1530  BP: (!) 146/70 137/85 (!) 138/98 (!) 162/113  Pulse: 90 90 90 94  Resp: (!) 25 (!) 23 18 (!) 22  Temp:   98.2 F (36.8 C)    TempSrc:  Oral    SpO2: 94% 95% 95% 95%  Weight:      Height:       Eyes: PERRL, lids and conjunctivae normal, no icterus, no nystagmus. ENMT: Mucous membranes dry on exam; posterior pharynx was clear no drainage no erythema Neck: normal, supple, no masses, no thyromegaly. Respiratory: No wheezing, no crackles, no using accessory muscle. Cardiovascular: Sinus tachycardia, no rubs, no gallops, no JVD on exam. Abdomen: no tenderness, no masses palpated. No hepatosplenomegaly. Bowel sounds positive.  Musculoskeletal: no clubbing / cyanosis. Good ROM, no contractures. No joint swelling. Skin: no petechiae. Patient with right-sided frontal abrasion after falling; Neurologic: CN 2-12 grossly intact. Sensation intact, DTR normal. Strength 3-4 out of 5 in the setting of poor conditioning and weakness. Psychiatric: Normal mood. Alert and oriented x 3. Per patient's daughter-in-law at bedside mentation is at baseline.  Labs on Admission: I have personally reviewed following labs  and imaging studies  CBC: Recent Labs  Lab 07/01/20 1128  WBC 18.0*  NEUTROABS 15.3*  HGB 12.5  HCT 38.6  MCV 93.2  PLT 397    Basic Metabolic Panel: Recent Labs  Lab 07/01/20 1128 07/01/20 1432  NA 128*  --   K 3.1*  --   CL 92*  --   CO2 24  --   GLUCOSE 169*  --   BUN 55*  --   CREATININE 2.80*  --   CALCIUM 8.8*  --   MG 2.3 2.1  PHOS  --  2.9    GFR: Estimated Creatinine Clearance: 11 mL/min (A) (by C-G formula based on SCr of 2.8 mg/dL (H)).  Liver Function Tests: Recent Labs  Lab 07/01/20 1128  AST 28  ALT 17  ALKPHOS 115  BILITOT 1.6*  PROT 6.8  ALBUMIN 3.3*    Urine analysis:    Component Value Date/Time   COLORURINE AMBER (A) 07/01/2020 1141   APPEARANCEUR HAZY (A) 07/01/2020 1141   APPEARANCEUR Clear 02/11/2018 1148   LABSPEC 1.016 07/01/2020 1141   PHURINE 5.0 07/01/2020 1141   GLUCOSEU NEGATIVE 07/01/2020 1141   HGBUR MODERATE (A) 07/01/2020 1141    BILIRUBINUR NEGATIVE 07/01/2020 1141   BILIRUBINUR Negative 02/11/2018 1148   KETONESUR NEGATIVE 07/01/2020 1141   PROTEINUR 100 (A) 07/01/2020 1141   NITRITE NEGATIVE 07/01/2020 1141   LEUKOCYTESUR SMALL (A) 07/01/2020 1141    Radiological Exams on Admission: DG Chest 2 View  Result Date: 07/01/2020 CLINICAL DATA:  Fall, COPD EXAM: CHEST - 2 VIEW COMPARISON:  04/13/2018 FINDINGS: Stable cardiomediastinal contours. Atherosclerotic calcification of the aortic knob. Hyperexpanded lungs with flattening of the diaphragms. Mildly coarsened interstitial markings. No focal airspace consolidation, pleural effusion, or pneumothorax. No acute osseous findings. IMPRESSION: 1. No acute cardiopulmonary findings. 2. Findings of COPD. Electronically Signed   By: Davina Poke D.O.   On: 07/01/2020 12:37   CT Head Wo Contrast  Result Date: 07/01/2020 CLINICAL DATA:  Head injury after fall. EXAM: CT HEAD WITHOUT CONTRAST CT CERVICAL SPINE WITHOUT CONTRAST TECHNIQUE: Multidetector CT imaging of the head and cervical spine was performed following the standard protocol without intravenous contrast. Multiplanar CT image reconstructions of the cervical spine were also generated. COMPARISON:  None. FINDINGS: CT HEAD FINDINGS Brain: No evidence of acute infarction, hemorrhage, hydrocephalus, extra-axial collection or mass lesion/mass effect. Vascular: No hyperdense vessel or unexpected calcification. Skull: Normal. Negative for fracture or focal lesion. Sinuses/Orbits: No acute finding. Other: None. CT CERVICAL SPINE FINDINGS Alignment: Minimal grade 1 anterolisthesis of C3-4 is noted secondary to posterior facet joint hypertrophy. Skull base and vertebrae: No acute fracture. No primary bone lesion or focal pathologic process. Soft tissues and spinal canal: No prevertebral fluid or swelling. No visible canal hematoma. Disc levels: Moderate degenerative disc disease is noted at C5-6 and C6-7. Upper chest: Negative.  Other: Degenerative changes are seen involving the right-sided posterior facet joints. IMPRESSION: 1. Normal head CT. 2. Multilevel degenerative disc disease. No acute abnormality seen in the cervical spine. Electronically Signed   By: Marijo Conception M.D.   On: 07/01/2020 12:28   CT Cervical Spine Wo Contrast  Result Date: 07/01/2020 CLINICAL DATA:  Head injury after fall. EXAM: CT HEAD WITHOUT CONTRAST CT CERVICAL SPINE WITHOUT CONTRAST TECHNIQUE: Multidetector CT imaging of the head and cervical spine was performed following the standard protocol without intravenous contrast. Multiplanar CT image reconstructions of the cervical spine were also generated. COMPARISON:  None. FINDINGS: CT HEAD  FINDINGS Brain: No evidence of acute infarction, hemorrhage, hydrocephalus, extra-axial collection or mass lesion/mass effect. Vascular: No hyperdense vessel or unexpected calcification. Skull: Normal. Negative for fracture or focal lesion. Sinuses/Orbits: No acute finding. Other: None. CT CERVICAL SPINE FINDINGS Alignment: Minimal grade 1 anterolisthesis of C3-4 is noted secondary to posterior facet joint hypertrophy. Skull base and vertebrae: No acute fracture. No primary bone lesion or focal pathologic process. Soft tissues and spinal canal: No prevertebral fluid or swelling. No visible canal hematoma. Disc levels: Moderate degenerative disc disease is noted at C5-6 and C6-7. Upper chest: Negative. Other: Degenerative changes are seen involving the right-sided posterior facet joints. IMPRESSION: 1. Normal head CT. 2. Multilevel degenerative disc disease. No acute abnormality seen in the cervical spine. Electronically Signed   By: Marijo Conception M.D.   On: 07/01/2020 12:28    EKG: Independently reviewed. No acute ischemic changes appreciated. Positive sinus tachycardia.  Assessment/Plan Early sepsis secondary to UTI: POA -Patient meeting early sepsis criteria with elevated heart rate, WBCs, and normal urinalysis  to suggest source of infection worsening renal function. -Follow culture results -Started antibiotic -Continue fluid resuscitation. -Will follow clinical response.  Fall -No acute abnormalities appreciated on her CT head or CT neck -Will provide fluid resuscitation and ask physical therapy to evaluate patient -Continue treatment for UTI as mentioned above.  Dehydration/hyponatremia -Will provide fluid resuscitation and follow electrolytes trend.  Acute kidney injury on chronic kidney disease a stage IV -Appears to be in the setting of prerenal azotemia -Avoid hypotension and minimize the use of nephrotoxic agent-continue fluid resuscitation, treat UTI and follow renal function trend.  Depression/anxiety -Mood appears to be stable -Continue the use of Celexa, doxepin and as needed Xanax  Hypertension -Continue the use of amlodipine -Blood pressure appears to be stable.  Gastroesophageal reflux disease -Continue PPI.  Hypokalemia -In the setting of GI losses and poor oral intake-check magnesium/peripheral IV -Follow lites trend and replete as needed. -Telemetry monitoring   Nausea/vomiting -Probably triggered by UTI -Will provide PPI and antiemetics -Started on full liquid diet with intention to advance as tolerated.  History of COPD -No requiring oxygen supplementation on exam-no wheezing currently -Chest x-ray without acute cardiopulmonary process -Continue as needed bronchodilators.  DNR -Wishes will be respected -Patient has expressed continue treatment as long after no requiring any invasive/artificial intervention -Daughter-in-law at bedside has confusion patient's request.  DVT prophylaxis: Heparin Code Status:   DNR/DNI. Family Communication:  Daughter-in-law at bedside. Disposition Plan:   Patient is from:  home  Anticipated DC to:  To be determined; hopefully home with [facility] services.  Anticipated DC date:  07/03/20  Anticipated DC barriers: Electrolyte  stabilization, improvement in renal function. Ability to safely ambulate.  Consults called:  None Admission status:  Inpatient, length of stay more than two midnights; telemetry bed.  Severity of Illness: Moderate to severe illness; patient presenting with hyponatremia, acute kidney injury on chronic renal failure; concern for UTI and early sepsis.    Barton Dubois MD [facility]  How to contact the [facility] Attending or Consulting provider [place] or covering provider during after hours [place], for this patient?   1. Check the care team in [facility] and look for a) attending/consulting [facility] provider listed and b) the [facility] team listed 2. Log into [email or web address] and use [place] universal password to access. If you do not have the password, please contact the hospital operator. 3. Locate the [facility] provider you are looking for under [facility]  and page to a number that you can be directly reached. 4. If you still have difficulty reaching the provider, please page the [facility] (Director on Call) for the Hospitalists listed on amion for assistance.  07/01/2020, 4:42 PM

## 2020-07-01 NOTE — ED Triage Notes (Signed)
Pt brought to ED via [facility], on Wednesday started with N/V, not eating or drinking since, this am at 0430 walking, fell and hit her head on the vacuum cleaner, no LOC, since 0430 she has fallen 4 other times. Pt with no complaints at this time. Pt family wanted her checked out.

## 2020-07-01 NOTE — ED Provider Notes (Signed)
[facility] EMERGENCY DEPARTMENT Provider Note   CSN: [id number] Arrival date & time: 07/01/20  1110     History Chief Complaint  Patient presents with  . Fall    Kelly Peters is a 86 y.o. female.  HPI She is here for evaluation of persistent vomiting for several days, and this morning fell while she was walking at 4:30 AM.  She injured her head but was able to get up and go back to bed.  She presents for evaluation by EMS.  She apparently lives alone.  It is not clear who called EMS.  She is a somewhat poor historian.  She denies other problems including fever, headache, neck pain, focal weakness or paresthesia.  She does not know of prior cardiac history.  There are no other known modifying factors.    Past Medical History:  Diagnosis Date  . Allergy   . Anxiety   . COPD (chronic obstructive pulmonary disease) ([place])   . Depression   . Hypertension     Patient Active Problem List   Diagnosis Date Noted  . Benzodiazepine dependence ([place]) 03/23/2020  . Controlled substance agreement signed 03/23/2020  . History of tobacco abuse 08/26/2018  . Moderate COPD (chronic obstructive pulmonary disease) ([place]) 08/26/2018  . Chronic fatigue 04/08/2018  . Depression, recurrent ([place]) 07/06/2017  . Cystocele, midline 07/06/2017  . Essential hypertension 09/26/2016  . Anxiety 09/26/2016  . Constipation 09/26/2016    Past Surgical History:  Procedure Laterality Date  . ABDOMINAL HYSTERECTOMY    . CATARACT EXTRACTION W/PHACO Left 10/03/2016   Procedure: CATARACT EXTRACTION PHACO AND INTRAOCULAR LENS PLACEMENT LEFT EYE CDE - 25.42;  Surgeon: Baruch Goldmann, MD;  Location: [facility];  Service: Ophthalmology;  Laterality: Left;  left  . CATARACT EXTRACTION W/PHACO Right 10/24/2016   Procedure: CATARACT EXTRACTION PHACO AND INTRAOCULAR LENS PLACEMENT RIGHT EYE CDE= 24.99;  Surgeon: Baruch Goldmann, MD;  Location: [facility];  Service: Ophthalmology;  Laterality: Right;  right     OB History    No obstetric history on file.     Family History  Problem Relation Age of Onset  . Stroke Mother   . Heart attack Father   . Heart attack Brother   . Diabetes Sister   . Stroke Sister   . COPD Daughter   . Hypertension Daughter   . Heart disease Son   . Hypertension Son   . Cancer Brother        kidney cancer  . Stroke Brother   . COPD Brother   . Diabetes Sister   . Thyroid disease Daughter     Social History   Tobacco Use  . Smoking status: Former Smoker    Packs/day: 0.50    Years: 58.00    Pack years: 29.00    Quit date: 07/23/2018    Years since quitting: 1.9  . Smokeless tobacco: Never Used  . Tobacco comment: down to 4-5 cigarettes a day 05/12/18  Vaping Use  . Vaping Use: Never used  Substance Use Topics  . Alcohol use: Not Currently  . Drug use: Never    Home Medications Prior to Admission medications   Medication Sig Start Date End Date Taking? Authorizing Provider  albuterol (VENTOLIN HFA) 108 (90 Base) MCG/ACT inhaler Inhale 2 puffs into the lungs QID. Patient not taking: Reported on 03/23/2020 11/22/19   Sharion Balloon, FNP  ALPRAZolam Duanne Moron) 0.5 MG tablet Take 1 tablet (0.5 mg total) by mouth 2 (two) times daily as needed  for anxiety. 03/23/20   Sharion Balloon, FNP  amLODipine (NORVASC) 5 MG tablet TAKE ONE (1) TABLET EACH DAY 03/23/20   [name] A, FNP  aspirin EC 81 MG tablet Take 81 mg by mouth daily.    [provider]  citalopram (CELEXA) 20 MG tablet Take 1 tablet (20 mg total) by mouth daily. 03/23/20   Sharion Balloon, FNP  doxepin (SINEQUAN) 10 MG capsule Take 1 capsule (10 mg total) by mouth at bedtime. 03/23/20   Sharion Balloon, FNP  omeprazole (PRILOSEC) 20 MG capsule Take 1 capsule (20 mg total) by mouth daily. 03/23/20   [name] A, FNP  polyethylene glycol (MIRALAX / GLYCOLAX) packet Take 17 g by mouth daily as needed for moderate constipation.     [provider]  senna (SENOKOT) 8.6 MG TABS tablet Take 1  tablet (8.6 mg total) by mouth at bedtime. 03/23/20   Sharion Balloon, FNP    Allergies    Penicillins  Review of Systems   Review of Systems  All other systems reviewed and are negative.   Physical Exam Updated Vital Signs Ht 5\' 2"  (1.575 m)   Wt 57.6 kg   BMI 23.23 kg/m   Physical Exam Vitals and nursing note reviewed.  Constitutional:      General: She is not in acute distress.    Appearance: She is well-developed and well-nourished. She is not ill-appearing, toxic-appearing or diaphoretic.     Comments: Elderly, frail  HENT:     Head: Normocephalic.     Comments: Contusion and small laceration right frontal, above the eyebrow.  No associated crepitation or deformity.    Right Ear: External ear normal.     Left Ear: External ear normal.     Nose: Nose normal. No congestion.     Mouth/Throat:     Mouth: Mucous membranes are moist.  Eyes:     Extraocular Movements: EOM normal.     Conjunctiva/sclera: Conjunctivae normal.     Pupils: Pupils are equal, round, and reactive to light.  Neck:     Trachea: Phonation normal.  Cardiovascular:     Rate and Rhythm: Tachycardia present. Rhythm irregular.     Heart sounds: Normal heart sounds.  Pulmonary:     Effort: Pulmonary effort is normal. No respiratory distress.     Breath sounds: Normal breath sounds. No stridor.  Chest:     Chest wall: No bony tenderness.  Abdominal:     General: There is no distension.     Palpations: Abdomen is soft.     Tenderness: There is no abdominal tenderness.  Musculoskeletal:        General: Normal range of motion.     Cervical back: Normal range of motion and neck supple.  Skin:    General: Skin is warm, dry and intact.  Neurological:     Mental Status: She is alert and oriented to person, place, and time.     Cranial Nerves: No cranial nerve deficit.     Sensory: No sensory deficit.     Motor: No abnormal muscle tone.     Coordination: Coordination normal.  Psychiatric:         Mood and Affect: Mood and affect normal.        Behavior: Behavior normal.        Thought Content: Thought content normal.        Judgment: Judgment normal.     ED Results / Procedures / Treatments  Labs (all labs ordered are listed, but only abnormal results are displayed) Labs Reviewed - No data to display  EKG None  Radiology No results found.  Procedures .Critical Care Performed by: Daleen Bo, MD Authorized by: Daleen Bo, MD   Critical care provider statement:    Critical care time (minutes):  35   Critical care start time:  07/01/2020 11:20 AM   Critical care end time:  07/01/2020 1:30 PM   Critical care time was exclusive of:  Separately billable procedures and treating other patients   Critical care was necessary to treat or prevent imminent or life-threatening deterioration of the following conditions:  Metabolic crisis   Critical care was time spent personally by me on the following activities:  Blood draw for specimens, development of treatment plan with patient or surrogate, discussions with consultants, evaluation of patient's response to treatment, examination of patient, obtaining history from patient or surrogate, ordering and performing treatments and interventions, ordering and review of laboratory studies, pulse oximetry, re-evaluation of patient's condition, review of old charts and ordering and review of radiographic studies   (including critical care time)  Medications Ordered in ED Medications - No data to display  ED Course  I have reviewed the triage vital signs and the nursing notes.  Pertinent labs & imaging results that were available during my care of the patient were reviewed by me and considered in my medical decision making (see chart for details).    MDM Rules/Calculators/A&P                           Patient Vitals for the past 24 hrs:  Height Weight  07/01/20 1116 5\' 2"  (1.575 m) 57.6 kg    1:23 PM Reevaluation with  update and discussion. After initial assessment and treatment, an updated evaluation reveals no change in clinical status.  Findings discussed and questions answered. Daleen Bo   Medical Decision Making:  This patient is presenting for evaluation of injury from fall, which does require a range of treatment options, and is a complaint that involves a high risk of morbidity and mortality. The differential diagnoses include head injury, acute illness, hemodynamic instability, metabolic process. I decided to review old records, and in summary elderly female, with illness for several days, characterized by nausea and vomiting followed by weakness and fall today.  She fell during the night but was able ambulate afterwards.  Injuries are not severe.  Patient arrives talkative and lucid..  I did not require additional historical information from anyone.  Clinical Laboratory Tests Ordered, included CBC, Metabolic panel, Urinalysis and Covid test, influenza test. Review indicates urinalysis slightly abnormal, CBC with white count elevated otherwise normal, metabolic panel abnormal with hyperglycemia, low sodium, low potassium, low chloride, elevated creatinine, calcium low, albumin low, total bilirubin high. Radiologic Tests Ordered, included CT head and cervical spine, chest x-ray.  I independently Visualized: Radiologic images, which show no acute abnormalities    Critical Interventions-clinical evaluation, laboratory testing, chest x-ray, CT imaging, observation reassessment  After These Interventions, the Patient was reevaluated and was found to require hospitalization.  Patient with significant metabolic processes, likely secondary to vomiting, and AKI.  Mildly abnormal urinalysis possibly consistent with UTI.  No significant head or neck injury.  No evidence for pneumonia.  Doubt severe sepsis.  She requires hospitalization for management.  CRITICAL CARE-yes Performed by: Daleen Bo  Nursing  Notes Reviewed/ Care Coordinated Applicable Imaging Reviewed Interpretation of Laboratory Data  incorporated into ED treatment   Case discussed with hospitalist arrange admission  Plan: Admit    Final Clinical Impression(s) / ED Diagnoses Final diagnoses:  AKI (acute kidney injury) ([place])  Injury of head, initial encounter  Facial laceration, initial encounter  Hypokalemia  Hyponatremia    Rx / DC Orders ED Discharge Orders    None       Daleen Bo, MD 07/01/20 1444

## 2020-07-02 DIAGNOSIS — I10 Essential (primary) hypertension: Secondary | ICD-10-CM

## 2020-07-02 LAB — CBC
HCT: 30.7 % — ABNORMAL LOW (ref 36.0–46.0)
Hemoglobin: 9.9 g/dL — ABNORMAL LOW (ref 12.0–15.0)
MCH: 30.7 pg (ref 26.0–34.0)
MCHC: 32.2 g/dL (ref 30.0–36.0)
MCV: 95 fL (ref 80.0–100.0)
Platelets: 148 10*3/uL — ABNORMAL LOW (ref 150–400)
RBC: 3.23 MIL/uL — ABNORMAL LOW (ref 3.87–5.11)
RDW: 13.4 % (ref 11.5–15.5)
WBC: 10.6 10*3/uL — ABNORMAL HIGH (ref 4.0–10.5)
nRBC: 0 % (ref 0.0–0.2)

## 2020-07-02 LAB — BASIC METABOLIC PANEL
Anion gap: 9 (ref 5–15)
BUN: 42 mg/dL — ABNORMAL HIGH (ref 8–23)
CO2: 22 mmol/L (ref 22–32)
Calcium: 7.9 mg/dL — ABNORMAL LOW (ref 8.9–10.3)
Chloride: 101 mmol/L (ref 98–111)
Creatinine, Ser: 1.94 mg/dL — ABNORMAL HIGH (ref 0.44–1.00)
GFR, Estimated: 24 mL/min — ABNORMAL LOW (ref >60)
Glucose, Bld: 101 mg/dL — ABNORMAL HIGH (ref 70–99)
Potassium: 3 mmol/L — ABNORMAL LOW (ref 3.5–5.1)
Sodium: 132 mmol/L — ABNORMAL LOW (ref 135–145)

## 2020-07-02 LAB — MRSA PCR SCREENING: MRSA by PCR: NEGATIVE

## 2020-07-02 MED ORDER — POTASSIUM CHLORIDE CRYS ER 20 MEQ PO TBCR
40.0000 meq | EXTENDED_RELEASE_TABLET | ORAL | Status: AC
  Administered 2020-07-02 (×2): 40 meq via ORAL
  Filled 2020-07-02 (×2): qty 2

## 2020-07-02 MED ORDER — CYANOCOBALAMIN 1000 MCG/ML IJ SOLN
1000.0000 ug | Freq: Every day | INTRAMUSCULAR | Status: DC
  Administered 2020-07-02 – 2020-07-03 (×2): 1000 ug via INTRAMUSCULAR
  Filled 2020-07-02 (×2): qty 1

## 2020-07-02 NOTE — Evaluation (Signed)
Physical Therapy Evaluation Patient Details Name: Kelly Peters MRN: [id number] DOB: 1929-07-25 Today's Date: 07/02/2020   History of Present Illness  Kelly Peters is a 86 y.o. female with medical history significant of COPD, depression/anxiety, hypertension and gastroesophageal flux disease; who presented to the hospital secondary to nausea, vomiting and not feeling well since Wednesday night. Patient expressed increased weakness and general malaise that has triggered to multiple falls on the day of admission. On one of these falls patient ended hitting her head and was brought to the hospital for further evaluation and management. Patient denies any focal weakness, loss of consciousness, chest pain, abdominal pain, fever, chills or shortness of breath. Patient reports decreased appetite, increased frequency (no dysuria) and just feeling weak.    Clinical Impression  Pt admitted with above diagnosis. Patient agreeable to participating in physical therapy evaluation today. Son present throughout session. Patient functioning near her baseline and was independent prior to admission. Patient had multiple falls leading up to her admission into the hospital but reports she is feeling much better now. Patient did exhibit instability and decreased balance during ambulation without an assistive device as her path varied minimally and she had a tendency to reach for nearby objects and slow her pace when avoiding obstacles in her path of for pivot transfers. Pt currently with functional limitations due to the deficits listed below (see PT Problem List). Pt will benefit from skilled PT to increase their independence and safety with mobility to allow discharge to the venue listed below.       Follow Up Recommendations Outpatient PT;Supervision - Intermittent;Supervision for mobility/OOB    Equipment Recommendations  None recommended by PT    Recommendations for Other Services       Precautions /  Restrictions Precautions Precautions: Fall Precaution Comments: falls prior to admission; no others in the last 6 months reported Restrictions Weight Bearing Restrictions: No      Mobility  Bed Mobility Overal bed mobility: Modified Independent   Transfers Overall transfer level: Needs assistance Equipment used: None Transfers: Sit to/from Stand;Stand Pivot Transfers Sit to Stand: Supervision Stand pivot transfers: Supervision       General transfer comment: slows pace for turning around and tends to reach for objects to steady as stand/pivots  Ambulation/Gait Ambulation/Gait assistance: Supervision;Min guard Gait Distance (Feet): 150 Feet Assistive device: None Gait Pattern/deviations: Step-through pattern;Decreased step length - right;Decreased step length - left;Decreased stride length;Wide base of support;Drifts right/left Gait velocity: decreased   General Gait Details: somewhat slow, labored gait; patient tends to reach for objects along path; minimal drifting left/right; slows pace for turns  Stairs    Wheelchair Mobility    Modified Rankin (Stroke Patients Only)       Balance Overall balance assessment: Mild deficits observed, not formally tested         Pertinent Vitals/Pain Pain Assessment: No/denies pain    Home Living Family/patient expects to be discharged to:: Private residence Living Arrangements: Alone Available Help at Discharge: Family;Available PRN/intermittently (one daughter lives next door and another lives close; check on her every day.) Type of Home: House Home Access: Stairs to enter Entrance Stairs-Rails: None Entrance Stairs-Number of Steps: 1 Home Layout: Two level;Able to live on main level with bedroom/bathroom Home Equipment: Walker - 4 wheels;Cane - single point;Grab bars - tub/shower;Hand held shower head;Shower seat;Tub bench Additional Comments: patient reports she continues to drive    Prior Function Level of  Independence: Independent  Hand Dominance        Extremity/Trunk Assessment   Upper Extremity Assessment Upper Extremity Assessment: Overall WFL for tasks assessed    Lower Extremity Assessment Lower Extremity Assessment: Overall WFL for tasks assessed    Cervical / Trunk Assessment Cervical / Trunk Assessment: Kyphotic  Communication   Communication: No difficulties  Cognition Arousal/Alertness: Awake/alert Behavior During Therapy: WFL for tasks assessed/performed Overall Cognitive Status: Within Functional Limits for tasks assessed     General Comments      Exercises     Assessment/Plan    PT Assessment Patient needs continued PT services  PT Problem List Decreased activity tolerance;Decreased balance;Decreased mobility       PT Treatment Interventions DME instruction;Balance training;Gait training;Neuromuscular re-education;Stair training;Functional mobility training;Patient/family education;Therapeutic activities;Therapeutic exercise    PT Goals (Current goals can be found in the Care Plan section)  Acute Rehab PT Goals Patient Stated Goal: Go home without outpatient physical therapy. PT Goal Formulation: With patient/family Time For Goal Achievement: 07/16/20 Potential to Achieve Goals: Good    Frequency Min 3X/week   Barriers to discharge           AM-PAC PT "6 Clicks" Mobility  Outcome Measure Help needed turning from your back to your side while in a flat bed without using bedrails?: A Little Help needed moving from lying on your back to sitting on the side of a flat bed without using bedrails?: A Little Help needed moving to and from a bed to a chair (including a wheelchair)?: A Little Help needed standing up from a chair using your arms (e.g., wheelchair or bedside chair)?: A Little Help needed to walk in hospital room?: A Little Help needed climbing 3-5 steps with a railing? : A Little 6 Click Score: 18    End of Session  Equipment Utilized During Treatment: Gait belt Activity Tolerance: Patient tolerated treatment well;Patient limited by fatigue Patient left: in chair;with call bell/phone within reach;with chair alarm set;with family/visitor present Nurse Communication: Mobility status PT Visit Diagnosis: Unsteadiness on feet (R26.81);Other abnormalities of gait and mobility (R26.89);Repeated falls (R29.6)    Time: 4431-5400 PT Time Calculation (min) (ACUTE ONLY): 30 min   Charges:   PT Evaluation $PT Eval Low Complexity: 1 Low PT Treatments $Therapeutic Activity: 8-22 mins        Floria Raveling. Hartnett-Rands, MS, PT Per [place] [phone] 07/02/2020, 1:51 PM

## 2020-07-02 NOTE — Plan of Care (Signed)
  Problem: Acute Rehab PT Goals(only PT should resolve) Goal: Patient Will Transfer Sit To/From Stand Outcome: Progressing Flowsheets (Taken 07/02/2020 1355) Patient will transfer sit to/from stand: with modified independence Goal: Pt Will Transfer Bed To Chair/Chair To Bed Outcome: Progressing Flowsheets (Taken 07/02/2020 1355) Pt will Transfer Bed to Chair/Chair to Bed: with modified independence Goal: Pt Will Ambulate Outcome: Progressing Flowsheets (Taken 07/02/2020 1355) Pt will Ambulate:  > 125 feet  with supervision  with least restrictive assistive device Goal: Pt Will Go Up/Down Stairs Outcome: Progressing Flowsheets (Taken 07/02/2020 1355) Pt will Go Up / Down Stairs:  1-2 stairs  with supervision  with least restrictive assistive device Goal: Pt/caregiver will Perform Home Exercise Program Outcome: Progressing Flowsheets (Taken 07/02/2020 1355) Pt/caregiver will Perform Home Exercise Program:  For increased strengthening  For improved balance  With minimal assist   Pamala Hurry D. Hartnett-Rands, MS, PT Per [place] [phone] 07/02/2020

## 2020-07-02 NOTE — TOC Initial Note (Signed)
Transition of Care [facility]) - Initial/Assessment Note    Patient Details  Name: Kelly Peters MRN: [id number] Date of Birth: 02/01/1930  Transition of Care [facility] CM/SW Contact:    Ihor Gully, LCSW Phone Number: 07/02/2020, 4:05 PM  Clinical Narrative:                 Patient from home alone. Family checks on her throughout the day. Independent at baseline. Drives. Has walker, cane, and shower chair. Started using cane when she got sick recently.  Daughter agreeable to OPPT. Referral made to OPPT.  Expected Discharge Plan: OP Rehab Barriers to Discharge: Continued Medical Work up   Patient Goals and CMS Choice Patient states their goals for this hospitalization and ongoing recovery are:: return home      Expected Discharge Plan and Services Expected Discharge Plan: OP Rehab       Living arrangements for the past 2 months: Single Family Home                                      Prior Living Arrangements/Services Living arrangements for the past 2 months: Single Family Home Lives with:: Self Patient language and need for interpreter reviewed:: Yes Do you feel safe going back to the place where you live?: Yes      Need for Family Participation in Patient Care: Yes (Comment) Care giver support system in place?: Yes (comment) Current home services: DME (cane, walker, shower chair) Criminal Activity/Legal Involvement Pertinent to Current Situation/Hospitalization: No - Comment as needed  Activities of Daily Living Home Assistive Devices/Equipment: None ADL Screening (condition at time of admission) Patient's cognitive ability adequate to safely complete daily activities?: Yes Is the patient deaf or have difficulty hearing?: No Does the patient have difficulty seeing, even when wearing glasses/contacts?: No Does the patient have difficulty concentrating, remembering, or making decisions?: No Patient able to express need for assistance with ADLs?: Yes Does the  patient have difficulty dressing or bathing?: No Independently performs ADLs?: Yes (appropriate for developmental age) Does the patient have difficulty walking or climbing stairs?: No Weakness of Legs: None Weakness of Arms/Hands: None  Permission Sought/Granted Permission sought to share information with : Family Supports    Share Information with NAME: Thermon Leyland     Permission granted to share info w Relationship: daughter     Emotional Assessment Appearance:: Appears stated age   Affect (typically observed): Unable to Assess Orientation: : Oriented to Self,Oriented to Place,Oriented to  Time,Oriented to Situation Alcohol / Substance Use: Not Applicable    Admission diagnosis:  Hypokalemia [E87.6] Hyponatremia [E87.1] UTI (urinary tract infection) [N39.0] AKI (acute kidney injury) ([place]) [N17.9] Facial laceration, initial encounter [S01.81XA] Injury of head, initial encounter [S09.90XA] Patient Active Problem List   Diagnosis Date Noted  . UTI (urinary tract infection) 07/01/2020  . Benzodiazepine dependence ([place]) 03/23/2020  . Controlled substance agreement signed 03/23/2020  . History of tobacco abuse 08/26/2018  . Moderate COPD (chronic obstructive pulmonary disease) ([place]) 08/26/2018  . Chronic fatigue 04/08/2018  . Depression, recurrent ([place]) 07/06/2017  . Cystocele, midline 07/06/2017  . Essential hypertension 09/26/2016  . Anxiety 09/26/2016  . Constipation 09/26/2016   PCP:  Sharion Balloon, FNP Pharmacy:   [facility] [place] [address] Phone: [phone] Fax: [phone]     Social Determinants of Health (SDOH)  Interventions    Readmission Risk Interventions No flowsheet data found.

## 2020-07-02 NOTE — Progress Notes (Signed)
PROGRESS NOTE    Kelly Peters  PZW:[id number] DOB: [birth year] DOA: 07/01/2020 PCP: Sharion Balloon, FNP   Chief Complaint  Patient presents with  . Fall    Brief Narrative:  Kelly Peters is a 86 y.o. female with medical history significant of COPD, depression/anxiety, hypertension and gastroesophageal flux disease; who presented to the hospital secondary to nausea, vomiting and not feeling well since Wednesday night. Patient expressed increased weakness and general malaise that has triggered to multiple falls on the day of admission. On one of these falls patient ended hitting her head and was brought to the hospital for further evaluation and management. Patient denies any focal weakness, loss of consciousness, chest pain, abdominal pain, fever, chills or shortness of breath. Patient reports decreased appetite, increased frequency (no dysuria) and just feeling weak.  ED Course: Chest x-ray without acute cardiopulmonary process; CT head without acute intracranial abnormality. Patient found to be dehydrated, with hyponatremia, hypokalemia, acute on chronic renal failure, elevated tachycardia and elevated WBCs. Meeting early sepsis criteria in the setting of UTI; patient UA suggesting infection. Cultures were taken, IV antibiotics-started empirically, fluid resuscitation given and TRH contacted to place patient in the hospital for further evaluation and management.  Assessment & Plan: Sepsis due to UTI (urinary tract infection) -continue current antibiotics and IVF's (rate adjusted) -follow clinical response  -continue supportive care  Fall -follow recommendations by PT -patient feeling much better after fluid resuscitation. -B12 found to be low, will replete  B12 deficiency -starting B12 injection for repletion -will need repeat B12 levels in 6 months.  Dehydration/hyponatremia/hypokalemia -continue IVF's and electrolytes repletion -improving and stabilizing   AKI on CKD  stage 4 -Will continue IVF's; adjusting rate today and further advancing diet -Cr down to 1.9 -advice to follow adequate oral hydration -continue to minimize nephrotoxic agents.  Depression/anxiety -mood stable -continue celexa, doxepin and PRN xanax  HTN -stable -contninue amlodipine  GERD -will continue PPI  Hx of COPD -no wheezing or crackles -continue PRN bronchodilators  DNR -will respect wishes -patient DNR/DNI; no feeding tubes or invasive/artificial interventions.    DVT prophylaxis: heparin  Code Status: DNR Family Communication: no family at bedside  Disposition:   Status is: Inpatient  Dispo: The patient is from: home              Anticipated d/c is to: home               Anticipated d/c date is: 1 day              Patient currently is reaching stability for discharge; will advance diet to assess tolerance; continue IV antibiotics and IVF's, replete electrolytes and follow renal function in am; close to her baseline and hopefully able to go home in am.        Consultants:   None    Procedures:  See below for x-ray reports.   Antimicrobials:  rocephin    Subjective: Reports feeling better. No CP, no nausea or vomiting. Patient is afebrile.   Objective: Vitals:   07/02/20 0445 07/02/20 0525 07/02/20 0732 07/02/20 0839  BP:  (!) 101/54  137/75  Pulse:  79 96   Resp:  (!) 23 (!) 21   Temp: 98.5 F (36.9 C)  99.9 F (37.7 C)   TempSrc: Oral  Axillary   SpO2:  (!) 88% 97%   Weight: 57.7 kg     Height:        Intake/Output Summary (Last 24 hours)  at 07/02/2020 0950 Last data filed at 07/02/2020 0854 Gross per 24 hour  Intake 3043.59 ml  Output --  Net 3043.59 ml   Filed Weights   07/01/20 1116 07/01/20 1700 07/02/20 0445  Weight: 57.6 kg 57.5 kg 57.7 kg    Examination:  General exam: Appears calm and comfortable; no fever and expressed feeling better. No further nausea or vomiting currently. Patient reports not  dysuria. Respiratory system: Clear to auscultation. Respiratory effort normal. No using accessory muscles.  Cardiovascular system: S1 & S2 heard, rate controlled, no rubs, no gallops, no JVD. Gastrointestinal system: Abdomen is nondistended, soft and nontender. No organomegaly or masses felt. Normal bowel sounds heard. Central nervous system: Alert and oriented. No focal neurological deficits. Extremities: no cyanosis, no clubbing.  Skin: No rashes, no petechiae. Psychiatry: Mood & affect appropriate.     Data Reviewed: I have personally reviewed following labs and imaging studies  CBC: Recent Labs  Lab 07/01/20 1128 07/02/20 0457  WBC 18.0* 10.6*  NEUTROABS 15.3*  --   HGB 12.5 9.9*  HCT 38.6 30.7*  MCV 93.2 95.0  PLT 213 148*    Basic Metabolic Panel: Recent Labs  Lab 07/01/20 1128 07/01/20 1432 07/02/20 0457  NA 128*  --  132*  K 3.1*  --  3.0*  CL 92*  --  101  CO2 24  --  22  GLUCOSE 169*  --  101*  BUN 55*  --  42*  CREATININE 2.80*  --  1.94*  CALCIUM 8.8*  --  7.9*  MG 2.3 2.1  --   PHOS  --  2.9  --     GFR: Estimated Creatinine Clearance: 15.9 mL/min (A) (by C-G formula based on SCr of 1.94 mg/dL (H)).  Liver Function Tests: Recent Labs  Lab 07/01/20 1128  AST 28  ALT 17  ALKPHOS 115  BILITOT 1.6*  PROT 6.8  ALBUMIN 3.3*    CBG: No results for input(s): GLUCAP in the last 168 hours.   Recent Results (from the past 240 hour(s))  Resp Panel by RT-PCR (Flu A&B, Covid) Nasopharyngeal Swab     Status: None   Collection Time: 07/01/20 11:41 AM   Specimen: Nasopharyngeal Swab; Nasopharyngeal(NP) swabs in vial transport medium  Result Value Ref Range Status   SARS Coronavirus 2 by RT PCR NEGATIVE NEGATIVE Final    Comment: (NOTE) SARS-CoV-2 target nucleic acids are NOT DETECTED.  The SARS-CoV-2 RNA is generally detectable in upper respiratory specimens during the acute phase of infection. The lowest concentration of SARS-CoV-2 viral copies  this assay can detect is 138 copies/mL. A negative result does not preclude SARS-Cov-2 infection and should not be used as the sole basis for treatment or other patient management decisions. A negative result may occur with  improper specimen collection/handling, submission of specimen other than nasopharyngeal swab, presence of viral mutation(s) within the areas targeted by this assay, and inadequate number of viral copies(<138 copies/mL). A negative result must be combined with clinical observations, patient history, and epidemiological information. The expected result is Negative.  Fact Sheet for Patients:  EntrepreneurPulse.com.au  Fact Sheet for Healthcare Providers:  IncredibleEmployment.be  This test is no t yet approved or cleared by the [place] FDA and  has been authorized for detection and/or diagnosis of SARS-CoV-2 by FDA under an Emergency Use Authorization (EUA). This EUA will remain  in effect (meaning this test can be used) for the duration of the COVID-19 declaration under Section 564(b)(1) of the Act,  21 U.S.C.section 360bbb-3(b)(1), unless the authorization is terminated  or revoked sooner.       Influenza A by PCR NEGATIVE NEGATIVE Final   Influenza B by PCR NEGATIVE NEGATIVE Final    Comment: (NOTE) The Xpert Xpress SARS-CoV-2/FLU/RSV plus assay is intended as an aid in the diagnosis of influenza from Nasopharyngeal swab specimens and should not be used as a sole basis for treatment. Nasal washings and aspirates are unacceptable for Xpert Xpress SARS-CoV-2/FLU/RSV testing.  Fact Sheet for Patients: EntrepreneurPulse.com.au  Fact Sheet for Healthcare Providers: IncredibleEmployment.be  This test is not yet approved or cleared by the [place] FDA and has been authorized for detection and/or diagnosis of SARS-CoV-2 by FDA under an Emergency Use Authorization (EUA). This EUA  will remain in effect (meaning this test can be used) for the duration of the COVID-19 declaration under Section 564(b)(1) of the Act, 21 U.S.C. section 360bbb-3(b)(1), unless the authorization is terminated or revoked.  Performed at [facility], [address], [place], [place] [address]   MRSA PCR Screening     Status: None   Collection Time: 07/01/20  4:57 PM   Specimen: Nasal Mucosa; Nasopharyngeal  Result Value Ref Range Status   MRSA by PCR NEGATIVE NEGATIVE Final    Comment:        The GeneXpert MRSA Assay (FDA approved for NASAL specimens only), is one component of a comprehensive MRSA colonization surveillance program. It is not intended to diagnose MRSA infection nor to guide or monitor treatment for MRSA infections. Performed at [facility], [address], [place], [place] [address]      Radiology Studies: DG Chest 2 View  Result Date: 07/01/2020 CLINICAL DATA:  Fall, COPD EXAM: CHEST - 2 VIEW COMPARISON:  04/13/2018 FINDINGS: Stable cardiomediastinal contours. Atherosclerotic calcification of the aortic knob. Hyperexpanded lungs with flattening of the diaphragms. Mildly coarsened interstitial markings. No focal airspace consolidation, pleural effusion, or pneumothorax. No acute osseous findings. IMPRESSION: 1. No acute cardiopulmonary findings. 2. Findings of COPD. Electronically Signed   By: Davina Poke D.O.   On: 07/01/2020 12:37   CT Head Wo Contrast  Result Date: 07/01/2020 CLINICAL DATA:  Head injury after fall. EXAM: CT HEAD WITHOUT CONTRAST CT CERVICAL SPINE WITHOUT CONTRAST TECHNIQUE: Multidetector CT imaging of the head and cervical spine was performed following the standard protocol without intravenous contrast. Multiplanar CT image reconstructions of the cervical spine were also generated. COMPARISON:  None. FINDINGS: CT HEAD FINDINGS Brain: No evidence of acute infarction, hemorrhage, hydrocephalus, extra-axial collection or mass lesion/mass effect.  Vascular: No hyperdense vessel or unexpected calcification. Skull: Normal. Negative for fracture or focal lesion. Sinuses/Orbits: No acute finding. Other: None. CT CERVICAL SPINE FINDINGS Alignment: Minimal grade 1 anterolisthesis of C3-4 is noted secondary to posterior facet joint hypertrophy. Skull base and vertebrae: No acute fracture. No primary bone lesion or focal pathologic process. Soft tissues and spinal canal: No prevertebral fluid or swelling. No visible canal hematoma. Disc levels: Moderate degenerative disc disease is noted at C5-6 and C6-7. Upper chest: Negative. Other: Degenerative changes are seen involving the right-sided posterior facet joints. IMPRESSION: 1. Normal head CT. 2. Multilevel degenerative disc disease. No acute abnormality seen in the cervical spine. Electronically Signed   By: Marijo Conception M.D.   On: 07/01/2020 12:28   CT Cervical Spine Wo Contrast  Result Date: 07/01/2020 CLINICAL DATA:  Head injury after fall. EXAM: CT HEAD WITHOUT CONTRAST CT CERVICAL SPINE WITHOUT CONTRAST TECHNIQUE: Multidetector CT imaging of the head  and cervical spine was performed following the standard protocol without intravenous contrast. Multiplanar CT image reconstructions of the cervical spine were also generated. COMPARISON:  None. FINDINGS: CT HEAD FINDINGS Brain: No evidence of acute infarction, hemorrhage, hydrocephalus, extra-axial collection or mass lesion/mass effect. Vascular: No hyperdense vessel or unexpected calcification. Skull: Normal. Negative for fracture or focal lesion. Sinuses/Orbits: No acute finding. Other: None. CT CERVICAL SPINE FINDINGS Alignment: Minimal grade 1 anterolisthesis of C3-4 is noted secondary to posterior facet joint hypertrophy. Skull base and vertebrae: No acute fracture. No primary bone lesion or focal pathologic process. Soft tissues and spinal canal: No prevertebral fluid or swelling. No visible canal hematoma. Disc levels: Moderate degenerative disc  disease is noted at C5-6 and C6-7. Upper chest: Negative. Other: Degenerative changes are seen involving the right-sided posterior facet joints. IMPRESSION: 1. Normal head CT. 2. Multilevel degenerative disc disease. No acute abnormality seen in the cervical spine. Electronically Signed   By: Marijo Conception M.D.   On: 07/01/2020 12:28    Scheduled Meds: . amLODipine  5 mg Oral Daily  . aspirin EC  81 mg Oral Daily  . Chlorhexidine Gluconate Cloth  6 each Topical Daily  . citalopram  20 mg Oral Daily  . cyanocobalamin  1,000 mcg Intramuscular Daily  . doxepin  10 mg Oral QHS  . heparin injection (subcutaneous)  5,000 Units Subcutaneous Q8H  . pantoprazole  40 mg Oral Daily  . potassium chloride  40 mEq Oral Q4H   Continuous Infusions: . sodium chloride 75 mL/hr at 07/02/20 0854  . cefTRIAXone (ROCEPHIN)  IV Stopped (07/01/20 1434)     LOS: 1 day    Time spent: 30 minutes    Barton Dubois, MD [facility]   To contact the attending provider between 7A-7P or the covering provider during after hours 7P-7A, please log into the web site [email or web address] and access using universal [place] password for that web site. If you do not have the password, please call the hospital operator.  07/02/2020, 9:50 AM

## 2020-07-03 ENCOUNTER — Telehealth: Payer: Self-pay

## 2020-07-03 DIAGNOSIS — N179 Acute kidney failure, unspecified: Secondary | ICD-10-CM

## 2020-07-03 DIAGNOSIS — N390 Urinary tract infection, site not specified: Secondary | ICD-10-CM

## 2020-07-03 DIAGNOSIS — E876 Hypokalemia: Secondary | ICD-10-CM

## 2020-07-03 DIAGNOSIS — S0181XA Laceration without foreign body of other part of head, initial encounter: Secondary | ICD-10-CM

## 2020-07-03 DIAGNOSIS — S0990XA Unspecified injury of head, initial encounter: Secondary | ICD-10-CM

## 2020-07-03 DIAGNOSIS — A419 Sepsis, unspecified organism: Secondary | ICD-10-CM

## 2020-07-03 DIAGNOSIS — E871 Hypo-osmolality and hyponatremia: Secondary | ICD-10-CM

## 2020-07-03 LAB — BASIC METABOLIC PANEL
Anion gap: 9 (ref 5–15)
BUN: 29 mg/dL — ABNORMAL HIGH (ref 8–23)
CO2: 21 mmol/L — ABNORMAL LOW (ref 22–32)
Calcium: 8.1 mg/dL — ABNORMAL LOW (ref 8.9–10.3)
Chloride: 104 mmol/L (ref 98–111)
Creatinine, Ser: 1.6 mg/dL — ABNORMAL HIGH (ref 0.44–1.00)
GFR, Estimated: 31 mL/min — ABNORMAL LOW (ref >60)
Glucose, Bld: 87 mg/dL (ref 70–99)
Potassium: 3.5 mmol/L (ref 3.5–5.1)
Sodium: 134 mmol/L — ABNORMAL LOW (ref 135–145)

## 2020-07-03 LAB — CBC
HCT: 32.6 % — ABNORMAL LOW (ref 36.0–46.0)
Hemoglobin: 10.3 g/dL — ABNORMAL LOW (ref 12.0–15.0)
MCH: 30.2 pg (ref 26.0–34.0)
MCHC: 31.6 g/dL (ref 30.0–36.0)
MCV: 95.6 fL (ref 80.0–100.0)
Platelets: 152 10*3/uL (ref 150–400)
RBC: 3.41 MIL/uL — ABNORMAL LOW (ref 3.87–5.11)
RDW: 13.7 % (ref 11.5–15.5)
WBC: 13.9 10*3/uL — ABNORMAL HIGH (ref 4.0–10.5)
nRBC: 0 % (ref 0.0–0.2)

## 2020-07-03 MED ORDER — CEFDINIR 300 MG PO CAPS: 300.0000 mg | ORAL_CAPSULE | Freq: Every day | ORAL | 0 refills | Status: DC

## 2020-07-03 MED ORDER — CYANOCOBALAMIN 1000 MCG/ML IJ SOLN: INTRAMUSCULAR | 0 refills | Status: DC

## 2020-07-03 NOTE — Discharge Summary (Addendum)
Physician Discharge Summary  Kelly Peters OBS:[id number] DOB: October 31, 1929 DOA: 07/01/2020  PCP: Sharion Balloon, FNP  Admit date: 07/01/2020 Discharge date: 07/03/2020  Time spent: 35 minutes  Recommendations for Outpatient Follow-up:  1. Repeat CBC to follow resolution of WBCs 2. -Repeat basic metabolic panel to follow electrolytes and renal function and stability 3. Reassess blood pressure and adjust antihypertensive treatment as needed.   Discharge Diagnoses:  Active Problems:   UTI (urinary tract infection)   AKI (acute kidney injury) ([place])   Facial laceration   Hypokalemia   Hyponatremia   Head injury   Sepsis secondary to UTI [facility])   Discharge Condition: Stable and improved.  Patient discharged home with home health services and instruction to follow-up with PCP in 10 days.  CODE STATUS: Full code.  Diet recommendation: Heart healthy diet.  Filed Weights   07/01/20 1116 07/01/20 1700 07/02/20 0445  Weight: 57.6 kg 57.5 kg 57.7 kg    History of present illness:  Kelly Peters a 86 y.o.femalewith medical history significant ofCOPD, depression/anxiety, hypertensionandgastroesophageal flux disease;who presented to the hospital secondary to nausea, vomiting and not feeling well since Wednesday night. Patient expressed increased weakness and general malaise that has triggered to multiple falls on the day of admission.On one of these falls patient ended hitting her head and was brought to the hospital for further evaluation and management. Patient denies any focal weakness, loss of consciousness, chest pain, abdominal pain, fever, chills or shortness of breath. Patient reports decreased appetite, increased frequency (no dysuria) and just feeling weak.  ED Course:Chest x-ray without acute cardiopulmonary process; CT head without acute intracranial abnormality. Patient found to be dehydrated, with hyponatremia, hypokalemia, acute on chronic renal failure, elevated  tachycardia and elevated WBCs. Meeting early sepsis criteria in the setting of UTI; patient UA suggesting infection. Cultures were taken, IV antibiotics-started empirically, fluid resuscitation given and TRH contacted to place patient in the hospital for further evaluation and management.  Hospital Course:  Sepsis due to pseudomona UTI (urinary tract infection): Present on admission. -Complete oral cefdinir antibiotic therapy -Patient tolerated the use of cephalosporin without complications. -Advised to maintain adequate hydration -At discharge sepsis features essentially resolved.  Fall -following recommendations by PT patient will be discharged home with home health services -No focal deficits or fractures appreciated.  B12 deficiency -started on B12 injection for repletion -will need repeat B12 levels in 6 months.  Dehydration/hyponatremia/hypokalemia -Improved/stabilized after electrolyte repletion and fluid resuscitation -Patient advised to maintain adequate oral nourishment.  AKI on CKD stage 4 -Cr down to 1.6 at time of discharge. -advice to follow adequate oral hydration -continue to minimize nephrotoxic agents. -Complete treatment for acute UTI.  Depression/anxiety -mood stable -continue celexa, doxepin and PRN xanax  HTN -stable and well-controlled. -contninue amlodipine  GERD -will continue PPI  Hx of COPD -no wheezing or crackles -Good oxygen saturation on room air. -continue PRN bronchodilators  DNR -wishes respected. -patient DNR/DNI; no feeding tubes or invasive/artificial interventions  Procedures: See below for x-ray reports.  Consultations:  None  Discharge Exam: Vitals:   07/03/20 0822 07/03/20 1117  BP:    Pulse:  86  Resp: (!) 24 (!) 21  Temp: 97.8 F (36.6 C) 98.9 F (37.2 C)  SpO2:  91%    General: Afebrile, no chest pain, no nausea, no vomiting.  Patient denies nausea, vomiting and is tolerating diet.  Feeling ready to  go home.   Cardiovascular: Rate controlled, no rubs, no gallops, no JVD on exam.  Respiratory: Good air movement bilaterally, no wheezing, no crackles. Abdomen: Soft, nontender, nondistended, positive bowel sounds Extremities: No cyanosis or clubbing. Skin :right forehead face laceration healing appropriately; no signs of superimposed infection.  Discharge Instructions   Discharge Instructions    Ambulatory referral to Physical Therapy   Complete by: As directed    Diet - low sodium heart healthy   Complete by: As directed    Discharge wound care:   Complete by: As directed    Keep area clean and dry.   Increase activity slowly   Complete by: As directed    Take medications as prescribed Maintain adequate hydration Arrange follow-up with PCP in 10 days Follow heart healthy diet     Allergies as of 07/03/2020      Reactions   Penicillins Rash   Has patient had a PCN reaction causing immediate rash, facial/tongue/throat swelling, SOB or lightheadedness with hypotension:No Has patient had a PCN reaction causing severe rash involving mucus membranes or skin necrosis:Yes Has patient had a PCN reaction that required hospitalization:No Has patient had a PCN reaction occurring within the last 10 years:No If all of the above answers are "NO", then may proceed with Cephalosporin use.      Medication List    STOP taking these medications   senna 8.6 MG Tabs tablet Commonly known as: SENOKOT   sucralfate 1 g tablet Commonly known as: CARAFATE     TAKE these medications   albuterol 108 (90 Base) MCG/ACT inhaler Commonly known as: VENTOLIN HFA Inhale 2 puffs into the lungs QID.   ALPRAZolam 0.5 MG tablet Commonly known as: XANAX Take 1 tablet (0.5 mg total) by mouth 2 (two) times daily as needed for anxiety.   amLODipine 5 MG tablet Commonly known as: NORVASC TAKE ONE (1) TABLET EACH DAY What changed:   how much to take  how to take this  when to take  this  additional instructions   aspirin EC 81 MG tablet Take 81 mg by mouth daily.   cefdinir 300 MG capsule Commonly known as: OMNICEF Take 1 capsule (300 mg total) by mouth daily.   citalopram 20 MG tablet Commonly known as: CELEXA Take 1 tablet (20 mg total) by mouth daily.   cyanocobalamin 1000 MCG/ML injection Commonly known as: (VITAMIN B-12) Inject daily for 5 days; then weekly for a month and then monthly after that.   doxepin 10 MG capsule Commonly known as: SINEQUAN Take 1 capsule (10 mg total) by mouth at bedtime.   hyoscyamine 0.125 MG Tbdp disintergrating tablet Commonly known as: ANASPAZ Take 0.125 mg by mouth every 4 (four) hours as needed for other.   omeprazole 20 MG capsule Commonly known as: PRILOSEC Take 1 capsule (20 mg total) by mouth daily.   ondansetron 4 MG disintegrating tablet Commonly known as: ZOFRAN-ODT Take 4 mg by mouth every 8 (eight) hours as needed for nausea/vomiting.   polyethylene glycol 17 g packet Commonly known as: MIRALAX / GLYCOLAX Take 17 g by mouth daily as needed for moderate constipation.            Discharge Care Instructions  (From admission, onward)         Start     Ordered   07/03/20 0000  Discharge wound care:       Comments: Keep area clean and dry.   07/03/20 1335         Allergies  Allergen Reactions  . Penicillins Rash    Has patient had  a PCN reaction causing immediate rash, facial/tongue/throat swelling, SOB or lightheadedness with hypotension:No Has patient had a PCN reaction causing severe rash involving mucus membranes or skin necrosis:Yes Has patient had a PCN reaction that required hospitalization:No Has patient had a PCN reaction occurring within the last 10 years:No If all of the above answers are "NO", then may proceed with Cephalosporin use.     Follow-up Information    Sharion Balloon, FNP. Schedule an appointment as soon as possible for a visit in 10 day(s).   Specialty: Family  Medicine Contact information: [place] [address] [phone]               The results of significant diagnostics from this hospitalization (including imaging, microbiology, ancillary and laboratory) are listed below for reference.    Significant Diagnostic Studies: DG Chest 2 View  Result Date: 07/01/2020 CLINICAL DATA:  Fall, COPD EXAM: CHEST - 2 VIEW COMPARISON:  04/13/2018 FINDINGS: Stable cardiomediastinal contours. Atherosclerotic calcification of the aortic knob. Hyperexpanded lungs with flattening of the diaphragms. Mildly coarsened interstitial markings. No focal airspace consolidation, pleural effusion, or pneumothorax. No acute osseous findings. IMPRESSION: 1. No acute cardiopulmonary findings. 2. Findings of COPD. Electronically Signed   By: Davina Poke D.O.   On: 07/01/2020 12:37   CT Head Wo Contrast  Result Date: 07/01/2020 CLINICAL DATA:  Head injury after fall. EXAM: CT HEAD WITHOUT CONTRAST CT CERVICAL SPINE WITHOUT CONTRAST TECHNIQUE: Multidetector CT imaging of the head and cervical spine was performed following the standard protocol without intravenous contrast. Multiplanar CT image reconstructions of the cervical spine were also generated. COMPARISON:  None. FINDINGS: CT HEAD FINDINGS Brain: No evidence of acute infarction, hemorrhage, hydrocephalus, extra-axial collection or mass lesion/mass effect. Vascular: No hyperdense vessel or unexpected calcification. Skull: Normal. Negative for fracture or focal lesion. Sinuses/Orbits: No acute finding. Other: None. CT CERVICAL SPINE FINDINGS Alignment: Minimal grade 1 anterolisthesis of C3-4 is noted secondary to posterior facet joint hypertrophy. Skull base and vertebrae: No acute fracture. No primary bone lesion or focal pathologic process. Soft tissues and spinal canal: No prevertebral fluid or swelling. No visible canal hematoma. Disc levels: Moderate degenerative disc disease is noted at C5-6  and C6-7. Upper chest: Negative. Other: Degenerative changes are seen involving the right-sided posterior facet joints. IMPRESSION: 1. Normal head CT. 2. Multilevel degenerative disc disease. No acute abnormality seen in the cervical spine. Electronically Signed   By: Marijo Conception M.D.   On: 07/01/2020 12:28   CT Cervical Spine Wo Contrast  Result Date: 07/01/2020 CLINICAL DATA:  Head injury after fall. EXAM: CT HEAD WITHOUT CONTRAST CT CERVICAL SPINE WITHOUT CONTRAST TECHNIQUE: Multidetector CT imaging of the head and cervical spine was performed following the standard protocol without intravenous contrast. Multiplanar CT image reconstructions of the cervical spine were also generated. COMPARISON:  None. FINDINGS: CT HEAD FINDINGS Brain: No evidence of acute infarction, hemorrhage, hydrocephalus, extra-axial collection or mass lesion/mass effect. Vascular: No hyperdense vessel or unexpected calcification. Skull: Normal. Negative for fracture or focal lesion. Sinuses/Orbits: No acute finding. Other: None. CT CERVICAL SPINE FINDINGS Alignment: Minimal grade 1 anterolisthesis of C3-4 is noted secondary to posterior facet joint hypertrophy. Skull base and vertebrae: No acute fracture. No primary bone lesion or focal pathologic process. Soft tissues and spinal canal: No prevertebral fluid or swelling. No visible canal hematoma. Disc levels: Moderate degenerative disc disease is noted at C5-6 and C6-7. Upper chest: Negative. Other: Degenerative changes are seen involving the right-sided  posterior facet joints. IMPRESSION: 1. Normal head CT. 2. Multilevel degenerative disc disease. No acute abnormality seen in the cervical spine. Electronically Signed   By: Marijo Conception M.D.   On: 07/01/2020 12:28    Microbiology: Recent Results (from the past 240 hour(s))  Urine Culture     Status: Abnormal (Preliminary result)   Collection Time: 07/01/20 11:28 AM   Specimen: Urine, Clean Catch  Result Value Ref Range  Status   Specimen Description   Final    URINE, CLEAN CATCH Performed at [facility], [address] [place], [place] [address]    Special Requests   Final    NONE Performed at [facility], [address], [place], [place] [address]    Culture (A)  Final    10,000 COLONIES/mL PSEUDOMONAS AERUGINOSA SUSCEPTIBILITIES TO FOLLOW Performed at [facility], [place] [address], [place], [place] [address]    Report Status PENDING  Incomplete  Resp Panel by RT-PCR (Flu A&B, Covid) Nasopharyngeal Swab     Status: None   Collection Time: 07/01/20 11:41 AM   Specimen: Nasopharyngeal Swab; Nasopharyngeal(NP) swabs in vial transport medium  Result Value Ref Range Status   SARS Coronavirus 2 by RT PCR NEGATIVE NEGATIVE Final    Comment: (NOTE) SARS-CoV-2 target nucleic acids are NOT DETECTED.  The SARS-CoV-2 RNA is generally detectable in upper respiratory specimens during the acute phase of infection. The lowest concentration of SARS-CoV-2 viral copies this assay can detect is 138 copies/mL. A negative result does not preclude SARS-Cov-2 infection and should not be used as the sole basis for treatment or other patient management decisions. A negative result may occur with  improper specimen collection/handling, submission of specimen other than nasopharyngeal swab, presence of viral mutation(s) within the areas targeted by this assay, and inadequate number of viral copies(<138 copies/mL). A negative result must be combined with clinical observations, patient history, and epidemiological information. The expected result is Negative.  Fact Sheet for Patients:  EntrepreneurPulse.com.au  Fact Sheet for Healthcare Providers:  IncredibleEmployment.be  This test is no t yet approved or cleared by the [place] FDA and  has been authorized for detection and/or diagnosis of SARS-CoV-2 by FDA under an Emergency Use Authorization (EUA). This EUA will  remain  in effect (meaning this test can be used) for the duration of the COVID-19 declaration under Section 564(b)(1) of the Act, 21 U.S.C.section 360bbb-3(b)(1), unless the authorization is terminated  or revoked sooner.       Influenza A by PCR NEGATIVE NEGATIVE Final   Influenza B by PCR NEGATIVE NEGATIVE Final    Comment: (NOTE) The Xpert Xpress SARS-CoV-2/FLU/RSV plus assay is intended as an aid in the diagnosis of influenza from Nasopharyngeal swab specimens and should not be used as a sole basis for treatment. Nasal washings and aspirates are unacceptable for Xpert Xpress SARS-CoV-2/FLU/RSV testing.  Fact Sheet for Patients: EntrepreneurPulse.com.au  Fact Sheet for Healthcare Providers: IncredibleEmployment.be  This test is not yet approved or cleared by the [place] FDA and has been authorized for detection and/or diagnosis of SARS-CoV-2 by FDA under an Emergency Use Authorization (EUA). This EUA will remain in effect (meaning this test can be used) for the duration of the COVID-19 declaration under Section 564(b)(1) of the Act, 21 U.S.C. section 360bbb-3(b)(1), unless the authorization is terminated or revoked.  Performed at [facility], [address], [place], [place] [address]   MRSA PCR Screening     Status: None   Collection Time:  07/01/20  4:57 PM   Specimen: Nasal Mucosa; Nasopharyngeal  Result Value Ref Range Status   MRSA by PCR NEGATIVE NEGATIVE Final    Comment:        The GeneXpert MRSA Assay (FDA approved for NASAL specimens only), is one component of a comprehensive MRSA colonization surveillance program. It is not intended to diagnose MRSA infection nor to guide or monitor treatment for MRSA infections. Performed at [facility], [address] [place], [place] [address]      Labs: Basic Metabolic Panel: Recent Labs  Lab 07/01/20 1128 07/01/20 1432 07/02/20 0457 07/03/20 0419  NA 128*  --   132* 134*  K 3.1*  --  3.0* 3.5  CL 92*  --  101 104  CO2 24  --  22 21*  GLUCOSE 169*  --  101* 87  BUN 55*  --  42* 29*  CREATININE 2.80*  --  1.94* 1.60*  CALCIUM 8.8*  --  7.9* 8.1*  MG 2.3 2.1  --   --   PHOS  --  2.9  --   --    Liver Function Tests: Recent Labs  Lab 07/01/20 1128  AST 28  ALT 17  ALKPHOS 115  BILITOT 1.6*  PROT 6.8  ALBUMIN 3.3*   CBC: Recent Labs  Lab 07/01/20 1128 07/02/20 0457 07/03/20 0419  WBC 18.0* 10.6* 13.9*  NEUTROABS 15.3*  --   --   HGB 12.5 9.9* 10.3*  HCT 38.6 30.7* 32.6*  MCV 93.2 95.0 95.6  PLT 213 148* 152    Signed:  Barton Dubois MD.  [facility] 07/03/2020, 1:36 PM

## 2020-07-03 NOTE — Telephone Encounter (Signed)
Transition Care Management Unsuccessful Follow-up Telephone Call  Date of discharge and from where:  [facility]- 07/03/20  Attempts:  1st Attempt  Reason for unsuccessful TCM follow-up call:  Left voice message

## 2020-07-03 NOTE — TOC Transition Note (Signed)
Transition of Care [facility]) - CM/SW Discharge Note   Patient Details  Name: Kelly Peters MRN: [id number] Date of Birth: 01-26-1930  Transition of Care [facility]) CM/SW Contact:  Ihor Gully, LCSW Phone Number: 07/03/2020, 2:08 PM   Clinical Narrative:    Daughter Ms. Wakins, advised attending that patient does not go out often and that it would be difficult getting her to OPPT. Alvis Lemmings is agreeable and accepting of HHPT referral. Daughter informed.     Final next level of care: OP Rehab Barriers to Discharge: Continued Medical Work up   Patient Goals and CMS Choice Patient states their goals for this hospitalization and ongoing recovery are:: return home      Discharge Placement                       Discharge Plan and Services                          HH Arranged: PT [place]: [place] Date [place]: 07/03/20 Time [place]: 2035 Representative spoke with at [place]: cory  Social Determinants of Health ([place]) Interventions     Readmission Risk Interventions No flowsheet data found.

## 2020-07-04 NOTE — Telephone Encounter (Signed)
Contact Date: 07/04/2020 Contacted By: Nigel Berthold  Transition Care Management Follow-up Telephone Call  Date of discharge and from where: [facility]  Discharge Diagnosis:AKI  How have you been since you were released from the hospital? Patient states she is "okay".  Any questions or concerns? No   Items Reviewed:  Did the pt receive and understand the discharge instructions provided? Yes   Medications obtained and verified? Yes   Any new allergies since your discharge? No   Dietary orders reviewed? Yes  Do you have support at home? Yes   Discontinued Medications   STOP taking these medications       senna 8.6 MG Tabs tablet Commonly known as: SENOKOT   sucralfate 1 g tablet Commonly known as: CARAFATE    New Medications Added Cefinir 300mg   Current Medication List Allergies as of 07/03/2020      Reactions   Penicillins Rash   Has patient had a PCN reaction causing immediate rash, facial/tongue/throat swelling, SOB or lightheadedness with hypotension:No Has patient had a PCN reaction causing severe rash involving mucus membranes or skin necrosis:Yes Has patient had a PCN reaction that required hospitalization:No Has patient had a PCN reaction occurring within the last 10 years:No If all of the above answers are "NO", then may proceed with Cephalosporin use.      Medication List       Accurate as of July 03, 2020 11:59 PM. If you have any questions, ask your nurse or doctor.        albuterol 108 (90 Base) MCG/ACT inhaler Commonly known as: VENTOLIN HFA Inhale 2 puffs into the lungs QID.   ALPRAZolam 0.5 MG tablet Commonly known as: XANAX Take 1 tablet (0.5 mg total) by mouth 2 (two) times daily as needed for anxiety.   amLODipine 5 MG tablet Commonly known as: NORVASC TAKE ONE (1) TABLET EACH DAY What changed:   how much to take  how to take this  when to take this  additional instructions   aspirin EC 81 MG  tablet Take 81 mg by mouth daily.   cefdinir 300 MG capsule Commonly known as: OMNICEF Take 1 capsule (300 mg total) by mouth daily.   citalopram 20 MG tablet Commonly known as: CELEXA Take 1 tablet (20 mg total) by mouth daily.   cyanocobalamin 1000 MCG/ML injection Commonly known as: (VITAMIN B-12) Inject daily for 5 days; then weekly for a month and then monthly after that.   doxepin 10 MG capsule Commonly known as: SINEQUAN Take 1 capsule (10 mg total) by mouth at bedtime.   hyoscyamine 0.125 MG Tbdp disintergrating tablet Commonly known as: ANASPAZ Take 0.125 mg by mouth every 4 (four) hours as needed for other.   omeprazole 20 MG capsule Commonly known as: PRILOSEC Take 1 capsule (20 mg total) by mouth daily.   ondansetron 4 MG disintegrating tablet Commonly known as: ZOFRAN-ODT Take 4 mg by mouth every 8 (eight) hours as needed for nausea/vomiting.   polyethylene glycol 17 g packet Commonly known as: MIRALAX / GLYCOLAX Take 17 g by mouth daily as needed for moderate constipation.        Home Care and Equipment/Supplies: Were home health services ordered? Yes- PT If so, what is the name of the agency? Patient is not sure.  Has the agency set up a time to come to the patient's home? no Were any new equipment or medical supplies ordered?  No What is the name of the medical supply agency? N/A  Were you able to get the supplies/equipment? not applicable Do you have any questions related to the use of the equipment or supplies? NoD  Functional Questionnaire: (I = Independent and D = Dependent) ADLs: I  Bathing/Dressing- I  Meal Prep- D  Eating- I  Maintaining continence- I  Transferring/Ambulation- I  Managing Meds- D  Follow up appointments reviewed:   PCP Hospital f/u appt confirmed? Yes  Scheduled to see [name], FNP on 07/12/20 @ 9:10am.  [facility] f/u appt confirmed? No   Are transportation arrangements needed? No   If their  condition worsens, is the pt aware to call PCP or go to the Emergency Dept.? Yes  Was the patient provided with contact information for the PCP's office or ED? Yes  Was to pt encouraged to call back with questions or concerns? Yes

## 2020-07-09 LAB — SUSCEPTIBILITY RESULT

## 2020-07-09 LAB — SUSCEPTIBILITY, AER + ANAEROB: Source of Sample: 8680

## 2020-07-12 ENCOUNTER — Other Ambulatory Visit: Payer: Self-pay

## 2020-07-12 ENCOUNTER — Encounter: Payer: Self-pay | Admitting: Family

## 2020-07-12 ENCOUNTER — Ambulatory Visit (INDEPENDENT_AMBULATORY_CARE_PROVIDER_SITE_OTHER): Payer: Medicare Other | Admitting: Family

## 2020-07-12 VITALS — BP 114/63 | HR 93 | Temp 97.8°F | Ht 62.0 in | Wt 126.0 lb

## 2020-07-12 DIAGNOSIS — N390 Urinary tract infection, site not specified: Secondary | ICD-10-CM | POA: Diagnosis not present

## 2020-07-12 DIAGNOSIS — A419 Sepsis, unspecified organism: Secondary | ICD-10-CM | POA: Diagnosis not present

## 2020-07-12 DIAGNOSIS — E538 Deficiency of other specified B group vitamins: Secondary | ICD-10-CM

## 2020-07-12 DIAGNOSIS — N179 Acute kidney failure, unspecified: Secondary | ICD-10-CM | POA: Insufficient documentation

## 2020-07-12 DIAGNOSIS — Z09 Encounter for follow-up examination after completed treatment for conditions other than malignant neoplasm: Secondary | ICD-10-CM | POA: Diagnosis not present

## 2020-07-12 LAB — BMP8+EGFR
BUN/Creatinine Ratio: 10 — ABNORMAL LOW (ref 12–28)
BUN: 17 mg/dL (ref 8–27)
CO2: 25 mmol/L (ref 20–29)
Calcium: 7.9 mg/dL — ABNORMAL LOW (ref 8.7–10.3)
Chloride: 101 mmol/L (ref 96–106)
Creatinine, Ser: 1.72 mg/dL — ABNORMAL HIGH (ref 0.57–1.00)
GFR calc Af Amer: 30 mL/min/{1.73_m2} — ABNORMAL LOW (ref >59)
GFR calc non Af Amer: 26 mL/min/{1.73_m2} — ABNORMAL LOW (ref >59)
Glucose: 135 mg/dL — ABNORMAL HIGH (ref 65–99)
Potassium: 3.5 mmol/L (ref 3.5–5.2)
Sodium: 143 mmol/L (ref 134–144)

## 2020-07-12 LAB — URINALYSIS, COMPLETE
Glucose, UA: NEGATIVE
Ketones, UA: NEGATIVE
Nitrite, UA: NEGATIVE
Specific Gravity, UA: 1.015 (ref 1.005–1.030)
Urobilinogen, Ur: 0.2 mg/dL (ref 0.2–1.0)
pH, UA: 5.5 (ref 5.0–7.5)

## 2020-07-12 LAB — CBC WITH DIFFERENTIAL/PLATELET
Basophils Absolute: 0.1 10*3/uL (ref 0.0–0.2)
Basos: 1 %
EOS (ABSOLUTE): 0.1 10*3/uL (ref 0.0–0.4)
Eos: 0 %
Hematocrit: 31.7 % — ABNORMAL LOW (ref 34.0–46.6)
Hemoglobin: 10.1 g/dL — ABNORMAL LOW (ref 11.1–15.9)
Immature Grans (Abs): 0.1 10*3/uL (ref 0.0–0.1)
Immature Granulocytes: 1 %
Lymphocytes Absolute: 1 10*3/uL (ref 0.7–3.1)
Lymphs: 9 %
MCH: 29.5 pg (ref 26.6–33.0)
MCHC: 31.9 g/dL (ref 31.5–35.7)
MCV: 93 fL (ref 79–97)
Monocytes Absolute: 0.6 10*3/uL (ref 0.1–0.9)
Monocytes: 5 %
Neutrophils Absolute: 9.6 10*3/uL — ABNORMAL HIGH (ref 1.4–7.0)
Neutrophils: 84 %
Platelets: 394 10*3/uL (ref 150–450)
RBC: 3.42 x10E6/uL — ABNORMAL LOW (ref 3.77–5.28)
RDW: 12.7 % (ref 11.7–15.4)
WBC: 11.4 10*3/uL — ABNORMAL HIGH (ref 3.4–10.8)

## 2020-07-12 LAB — MICROSCOPIC EXAMINATION: Epithelial Cells (non renal): >10 /hpf — AB (ref 0–10)

## 2020-07-12 MED ORDER — CEPHALEXIN 500 MG PO CAPS: 500.0000 mg | ORAL_CAPSULE | Freq: Two times a day (BID) | ORAL | 0 refills | Status: DC

## 2020-07-12 NOTE — Patient Instructions (Signed)

## 2020-07-12 NOTE — Progress Notes (Signed)
Subjective:    Patient ID: Kelly Peters, female    DOB: [birth year], 86 y.o.   MRN: [id number]  Chief Complaint  Patient presents with  . Transitions Of Care    HPI Today's visit was for Transitional Care Management.  The patient was discharged from [facility] on 07/03/20 with a primary diagnosis of UTI, .   Contact with the patient and/or caregiver, by a clinical staff member, was made on 07/04/20 and was documented as a telephone encounter within the EMR.  Through chart review and discussion with the patient I have determined that management of their condition is of moderate complexity.   PT went to the ED with weakness and multiple falls and diagnosed with a UTI. She was started on IV antibiotics and discharged on Cefdinir. She completed these. She reports her mental status is at baseline.   She refused home health.   She had AKI related to dehydration.   She was found to have Vit B 12 deficiency and was started on daily injections for 7 days then weekly for a month, then once a month.    Review of Systems  All other systems reviewed and are negative.      Objective:   Physical Exam Vitals reviewed.  Constitutional:      General: She is not in acute distress.    Appearance: She is well-developed and well-nourished.  HENT:     Head: Normocephalic and atraumatic.     Right Ear: Tympanic membrane normal.     Left Ear: Tympanic membrane normal.     Mouth/Throat:     Mouth: Oropharynx is clear and moist.  Eyes:     Pupils: Pupils are equal, round, and reactive to light.  Neck:     Thyroid: No thyromegaly.  Cardiovascular:     Rate and Rhythm: Normal rate and regular rhythm.     Pulses: Intact distal pulses.     Heart sounds: Murmur heard.    Pulmonary:     Effort: Pulmonary effort is normal. No respiratory distress.     Breath sounds: Normal breath sounds. No wheezing.  Abdominal:     General: Bowel sounds are normal. There is no distension.      Palpations: Abdomen is soft.     Tenderness: There is no abdominal tenderness.  Musculoskeletal:        General: No tenderness or edema. Normal range of motion.     Cervical back: Normal range of motion and neck supple.  Skin:    General: Skin is warm and dry.  Neurological:     Mental Status: She is alert and oriented to person, place, and time.     Cranial Nerves: No cranial nerve deficit.     Deep Tendon Reflexes: Reflexes are normal and symmetric.  Psychiatric:        Mood and Affect: Mood and affect normal.        Behavior: Behavior normal.        Thought Content: Thought content normal.        Judgment: Judgment normal.      BP 114/63   Pulse 93   Temp 97.8 F (36.6 C) (Temporal)   Ht 5' 2" (1.575 m)   Wt 126 lb (57.2 kg)   BMI 23.05 kg/m      Assessment & Plan:  RYLYNN KOBS comes in today with chief complaint of Transitions Of Care   Diagnosis and orders addressed:  1. Acute kidney injury ([place]) -  Urinalysis, Complete - Urine Culture; Future - Urine Culture - CBC with Differential/Platelet - BMP8+EGFR - cephALEXin (KEFLEX) 500 MG capsule; Take 1 capsule (500 mg total) by mouth 2 (two) times daily.  Dispense: 14 capsule; Refill: 0  2. Hospital discharge follow-up - CBC with Differential/Platelet - BMP8+EGFR  3. Sepsis secondary to UTI ([place]) Will start Keflex today - CBC with Differential/Platelet - BMP8+EGFR - cephALEXin (KEFLEX) 500 MG capsule; Take 1 capsule (500 mg total) by mouth 2 (two) times daily.  Dispense: 14 capsule; Refill: 0  4. Vitamin B 12 deficiency - CBC with Differential/Platelet - BMP8+EGFR   Labs pending Health Maintenance reviewed Diet and exercise encouraged  Follow up plan: 3 months    [name], FNP

## 2020-07-18 LAB — URINE CULTURE

## 2020-07-21 LAB — URINE CULTURE: Culture: 10000 — AB

## 2020-09-11 ENCOUNTER — Other Ambulatory Visit: Payer: Self-pay | Admitting: Family

## 2020-09-11 DIAGNOSIS — Z79899 Other long term (current) drug therapy: Secondary | ICD-10-CM

## 2020-09-11 DIAGNOSIS — F419 Anxiety disorder, unspecified: Secondary | ICD-10-CM

## 2020-09-11 DIAGNOSIS — F1320 Sedative, hypnotic or anxiolytic dependence, uncomplicated: Secondary | ICD-10-CM

## 2020-10-12 ENCOUNTER — Ambulatory Visit: Payer: Medicare Other | Admitting: Family

## 2020-10-24 ENCOUNTER — Ambulatory Visit (INDEPENDENT_AMBULATORY_CARE_PROVIDER_SITE_OTHER): Payer: Medicare Other | Admitting: Family

## 2020-10-24 ENCOUNTER — Encounter: Payer: Self-pay | Admitting: Family

## 2020-10-24 ENCOUNTER — Other Ambulatory Visit: Payer: Self-pay

## 2020-10-24 VITALS — BP 114/62 | HR 78 | Temp 97.0°F | Ht 62.0 in | Wt 125.6 lb

## 2020-10-24 DIAGNOSIS — K219 Gastro-esophageal reflux disease without esophagitis: Secondary | ICD-10-CM | POA: Diagnosis not present

## 2020-10-24 DIAGNOSIS — Z79899 Other long term (current) drug therapy: Secondary | ICD-10-CM | POA: Diagnosis not present

## 2020-10-24 DIAGNOSIS — F339 Major depressive disorder, recurrent, unspecified: Secondary | ICD-10-CM

## 2020-10-24 DIAGNOSIS — Z87891 Personal history of nicotine dependence: Secondary | ICD-10-CM

## 2020-10-24 DIAGNOSIS — R300 Dysuria: Secondary | ICD-10-CM | POA: Diagnosis not present

## 2020-10-24 DIAGNOSIS — E538 Deficiency of other specified B group vitamins: Secondary | ICD-10-CM

## 2020-10-24 DIAGNOSIS — I10 Essential (primary) hypertension: Secondary | ICD-10-CM

## 2020-10-24 DIAGNOSIS — F419 Anxiety disorder, unspecified: Secondary | ICD-10-CM

## 2020-10-24 DIAGNOSIS — J449 Chronic obstructive pulmonary disease, unspecified: Secondary | ICD-10-CM | POA: Diagnosis not present

## 2020-10-24 DIAGNOSIS — F1320 Sedative, hypnotic or anxiolytic dependence, uncomplicated: Secondary | ICD-10-CM

## 2020-10-24 LAB — MICROSCOPIC EXAMINATION
Bacteria, UA: NONE SEEN
RBC, Urine: NONE SEEN /hpf (ref 0–2)

## 2020-10-24 LAB — URINALYSIS, COMPLETE
Bilirubin, UA: NEGATIVE
Glucose, UA: NEGATIVE
Ketones, UA: NEGATIVE
Nitrite, UA: NEGATIVE
Protein,UA: NEGATIVE
RBC, UA: NEGATIVE
Specific Gravity, UA: 1.01 (ref 1.005–1.030)
Urobilinogen, Ur: 0.2 mg/dL (ref 0.2–1.0)
pH, UA: 5 (ref 5.0–7.5)

## 2020-10-24 MED ORDER — CITALOPRAM HYDROBROMIDE 20 MG PO TABS: 20.0000 mg | ORAL_TABLET | Freq: Every day | ORAL | 3 refills | Status: DC

## 2020-10-24 MED ORDER — OMEPRAZOLE 20 MG PO CPDR: 20.0000 mg | DELAYED_RELEASE_CAPSULE | Freq: Every day | ORAL | 11 refills | Status: DC

## 2020-10-24 MED ORDER — ALPRAZOLAM 0.5 MG PO TABS
0.5000 mg | ORAL_TABLET | Freq: Two times a day (BID) | ORAL | 2 refills | Status: DC | PRN
Start: 2020-10-24 — End: 2021-03-13

## 2020-10-24 MED ORDER — AMLODIPINE BESYLATE 5 MG PO TABS: 5.0000 mg | ORAL_TABLET | Freq: Every day | ORAL | 1 refills | Status: DC

## 2020-10-24 NOTE — Addendum Note (Signed)
Addended by: Brynda Peon F on: 10/24/2020 12:35 PM   Modules accepted: Orders

## 2020-10-24 NOTE — Progress Notes (Signed)
Subjective:    Patient ID: Kelly Peters, female    DOB: 1930-06-11, 87 y.o.   MRN: [id number]  Chief Complaint  Patient presents with  . Medical Management of Chronic Issues    PT presents to the office today for chronic follow up. Hypertension This is a chronic problem. The current episode started more than 1 year ago. The problem has been resolved since onset. The problem is controlled. Associated symptoms include anxiety and malaise/fatigue. Pertinent negatives include no peripheral edema or shortness of breath. Risk factors for coronary artery disease include sedentary lifestyle. Past treatments include calcium channel blockers. The current treatment provides moderate improvement. There is no history of CVA or heart failure.  Depression        This is a chronic problem.  The current episode started more than 1 year ago.   The onset quality is gradual.   The problem occurs intermittently.  Associated symptoms include helplessness, hopelessness and irritable.  Associated symptoms include no restlessness and not sad.  Past medical history includes anxiety.   Anxiety Presents for follow-up visit. Symptoms include depressed mood, irritability and nervous/anxious behavior. Patient reports no restlessness or shortness of breath. Symptoms occur occasionally. The severity of symptoms is moderate.    Gastroesophageal Reflux She complains of belching and heartburn. This is a chronic problem. The current episode started more than 1 year ago. The problem occurs occasionally. She has tried a PPI for the symptoms. The treatment provided moderate relief.   COPD  Uses albuterol as needed. She quit smoking 2 years ago. States her breathing is stable.   Review of Systems  Constitutional: Positive for irritability and malaise/fatigue.  Respiratory: Negative for shortness of breath.   Gastrointestinal: Positive for heartburn.  Psychiatric/Behavioral: Positive for depression. The patient is  nervous/anxious.   All other systems reviewed and are negative.      Objective:   Physical Exam Vitals reviewed.  Constitutional:      General: She is irritable. She is not in acute distress.    Appearance: She is well-developed.  HENT:     Head: Normocephalic and atraumatic.     Right Ear: Tympanic membrane normal.     Left Ear: Tympanic membrane normal.  Eyes:     Pupils: Pupils are equal, round, and reactive to light.  Neck:     Thyroid: No thyromegaly.  Cardiovascular:     Rate and Rhythm: Normal rate and regular rhythm.     Heart sounds: Normal heart sounds. No murmur heard.   Pulmonary:     Effort: Pulmonary effort is normal. No respiratory distress.     Breath sounds: Normal breath sounds. No wheezing.  Abdominal:     General: Bowel sounds are normal. There is no distension.     Palpations: Abdomen is soft.     Tenderness: There is no abdominal tenderness.  Musculoskeletal:        General: No tenderness. Normal range of motion.     Cervical back: Normal range of motion and neck supple.  Skin:    General: Skin is warm and dry.  Neurological:     Mental Status: She is alert and oriented to person, place, and time.     Cranial Nerves: No cranial nerve deficit.     Deep Tendon Reflexes: Reflexes are normal and symmetric.  Psychiatric:        Behavior: Behavior normal.        Thought Content: Thought content normal.  Judgment: Judgment normal.       BP 114/62   Pulse 78   Temp (!) 97 F (36.1 C) (Temporal)   Ht '5\' 2"'  (1.575 m)   Wt 125 lb 9.6 oz (57 kg)   BMI 22.97 kg/m      Assessment & Plan:  Kelly Peters comes in today with chief complaint of Medical Management of Chronic Issues   Diagnosis and orders addressed:  1. Anxiety - ALPRAZolam (XANAX) 0.5 MG tablet; Take 1 tablet (0.5 mg total) by mouth 2 (two) times daily as needed for anxiety.  Dispense: 45 tablet; Refill: 2 - citalopram (CELEXA) 20 MG tablet; Take 1 tablet (20 mg total) by  mouth daily.  Dispense: 90 tablet; Refill: 3 - ToxASSURE Select 13 (MW), Urine - CMP14+EGFR - CBC with Differential/Platelet  2. Benzodiazepine dependence (HCC) - ALPRAZolam (XANAX) 0.5 MG tablet; Take 1 tablet (0.5 mg total) by mouth 2 (two) times daily as needed for anxiety.  Dispense: 45 tablet; Refill: 2 - ToxASSURE Select 13 (MW), Urine - CMP14+EGFR - CBC with Differential/Platelet  3. Controlled substance agreement signed - ALPRAZolam (XANAX) 0.5 MG tablet; Take 1 tablet (0.5 mg total) by mouth 2 (two) times daily as needed for anxiety.  Dispense: 45 tablet; Refill: 2 - ToxASSURE Select 13 (MW), Urine - CMP14+EGFR - CBC with Differential/Platelet  4. Depression, recurrent ([place]) - citalopram (CELEXA) 20 MG tablet; Take 1 tablet (20 mg total) by mouth daily.  Dispense: 90 tablet; Refill: 3 - CMP14+EGFR - CBC with Differential/Platelet  5. Essential hypertension - amLODipine (NORVASC) 5 MG tablet; Take 1 tablet (5 mg total) by mouth daily.  Dispense: 90 tablet; Refill: 1 - CMP14+EGFR - CBC with Differential/Platelet  6. Moderate COPD (chronic obstructive pulmonary disease) (HCC) - CMP14+EGFR - CBC with Differential/Platelet  7. History of tobacco abuse - CMP14+EGFR - CBC with Differential/Platelet  8. Vitamin B 12 deficiency - Vitamin B12 - CMP14+EGFR - CBC with Differential/Platelet  9. Gastroesophageal reflux disease, unspecified whether esophagitis present - omeprazole (PRILOSEC) 20 MG capsule; Take 1 capsule (20 mg total) by mouth daily.  Dispense: 30 capsule; Refill: 11   Labs pending Patient reviewed in [place] controlled database, no flags noted. Contract and drug screen up dated today.  Health Maintenance reviewed Diet and exercise encouraged  Follow up plan: 3 months    [name], FNP

## 2020-10-24 NOTE — Patient Instructions (Signed)
Health Maintenance After Age 85 After age 85, you are at a higher risk for certain long-term diseases and infections as well as injuries from falls. Falls are a major cause of broken bones and head injuries in people who are older than age 85. Getting regular preventive care can help to keep you healthy and well. Preventive care includes getting regular testing and making lifestyle changes as recommended by your health care provider. Talk with your health care provider about:  Which screenings and tests you should have. A screening is a test that checks for a disease when you have no symptoms.  A diet and exercise plan that is right for you. What should I know about screenings and tests to prevent falls? Screening and testing are the best ways to find a health problem early. Early diagnosis and treatment give you the best chance of managing medical conditions that are common after age 85. Certain conditions and lifestyle choices may make you more likely to have a fall. Your health care provider may recommend:  Regular vision checks. Poor vision and conditions such as cataracts can make you more likely to have a fall. If you wear glasses, make sure to get your prescription updated if your vision changes.  Medicine review. Work with your health care provider to regularly review all of the medicines you are taking, including over-the-counter medicines. Ask your health care provider about any side effects that may make you more likely to have a fall. Tell your health care provider if any medicines that you take make you feel dizzy or sleepy.  Osteoporosis screening. Osteoporosis is a condition that causes the bones to get weaker. This can make the bones weak and cause them to break more easily.  Blood pressure screening. Blood pressure changes and medicines to control blood pressure can make you feel dizzy.  Strength and balance checks. Your health care provider may recommend certain tests to check your  strength and balance while standing, walking, or changing positions.  Foot health exam. Foot pain and numbness, as well as not wearing proper footwear, can make you more likely to have a fall.  Depression screening. You may be more likely to have a fall if you have a fear of falling, feel emotionally low, or feel unable to do activities that you used to do.  Alcohol use screening. Using too much alcohol can affect your balance and may make you more likely to have a fall. What actions can I take to lower my risk of falls? General instructions  Talk with your health care provider about your risks for falling. Tell your health care provider if: ? You fall. Be sure to tell your health care provider about all falls, even ones that seem minor. ? You feel dizzy, sleepy, or off-balance.  Take over-the-counter and prescription medicines only as told by your health care provider. These include any supplements.  Eat a healthy diet and maintain a healthy weight. A healthy diet includes low-fat dairy products, low-fat (lean) meats, and fiber from whole grains, beans, and lots of fruits and vegetables. Home safety  Remove any tripping hazards, such as rugs, cords, and clutter.  Install safety equipment such as grab bars in bathrooms and safety rails on stairs.  Keep rooms and walkways well-lit. Activity  Follow a regular exercise program to stay fit. This will help you maintain your balance. Ask your health care provider what types of exercise are appropriate for you.  If you need a cane or walker,   use it as recommended by your health care provider.  Wear supportive shoes that have nonskid soles.   Lifestyle  Do not drink alcohol if your health care provider tells you not to drink.  If you drink alcohol, limit how much you have: ? 0-1 drink a day for women. ? 0-2 drinks a day for men.  Be aware of how much alcohol is in your drink. In the [place] one drink equals one typical bottle of beer (12  oz), one-half glass of wine (5 oz), or one shot of hard liquor (1 oz).  Do not use any products that contain nicotine or tobacco, such as cigarettes and e-cigarettes. If you need help quitting, ask your health care provider. Summary  Having a healthy lifestyle and getting preventive care can help to protect your health and wellness after age 85.  Screening and testing are the best way to find a health problem early and help you avoid having a fall. Early diagnosis and treatment give you the best chance for managing medical conditions that are more common for people who are older than age 85.  Falls are a major cause of broken bones and head injuries in people who are older than age 85. Take precautions to prevent a fall at home.  Work with your health care provider to learn what changes you can make to improve your health and wellness and to prevent falls. This information is not intended to replace advice given to you by your health care provider. Make sure you discuss any questions you have with your health care provider. Document Revised: 10/28/2018 Document Reviewed: 05/20/2017 [employer]vier Patient Education  2021 [employer]  

## 2020-10-25 LAB — CMP14+EGFR
ALT: 11 IU/L (ref 0–32)
AST: 17 IU/L (ref 0–40)
Albumin/Globulin Ratio: 1.7 (ref 1.2–2.2)
Albumin: 4.5 g/dL (ref 3.6–4.6)
Alkaline Phosphatase: 150 IU/L — ABNORMAL HIGH (ref 44–121)
BUN/Creatinine Ratio: 16 (ref 12–28)
BUN: 20 mg/dL (ref 8–27)
Bilirubin Total: 0.4 mg/dL (ref 0.0–1.2)
CO2: 22 mmol/L (ref 20–29)
Calcium: 9.4 mg/dL (ref 8.7–10.3)
Chloride: 100 mmol/L (ref 96–106)
Creatinine, Ser: 1.29 mg/dL — ABNORMAL HIGH (ref 0.57–1.00)
Globulin, Total: 2.6 g/dL (ref 1.5–4.5)
Glucose: 95 mg/dL (ref 65–99)
Potassium: 4.7 mmol/L (ref 3.5–5.2)
Sodium: 141 mmol/L (ref 134–144)
Total Protein: 7.1 g/dL (ref 6.0–8.5)
eGFR: 40 mL/min/{1.73_m2} — ABNORMAL LOW (ref >59)

## 2020-10-25 LAB — CBC WITH DIFFERENTIAL/PLATELET
Basophils Absolute: 0.1 10*3/uL (ref 0.0–0.2)
Basos: 2 %
EOS (ABSOLUTE): 0.1 10*3/uL (ref 0.0–0.4)
Eos: 2 %
Hematocrit: 35.2 % (ref 34.0–46.6)
Hemoglobin: 11 g/dL — ABNORMAL LOW (ref 11.1–15.9)
Immature Grans (Abs): 0 10*3/uL (ref 0.0–0.1)
Immature Granulocytes: 0 %
Lymphocytes Absolute: 1.5 10*3/uL (ref 0.7–3.1)
Lymphs: 22 %
MCH: 29.2 pg (ref 26.6–33.0)
MCHC: 31.3 g/dL — ABNORMAL LOW (ref 31.5–35.7)
MCV: 93 fL (ref 79–97)
Monocytes Absolute: 0.6 10*3/uL (ref 0.1–0.9)
Monocytes: 8 %
Neutrophils Absolute: 4.5 10*3/uL (ref 1.4–7.0)
Neutrophils: 66 %
Platelets: 205 10*3/uL (ref 150–450)
RBC: 3.77 x10E6/uL (ref 3.77–5.28)
RDW: 12.5 % (ref 11.7–15.4)
WBC: 6.8 10*3/uL (ref 3.4–10.8)

## 2020-10-25 LAB — VITAMIN B12: Vitamin B-12: 1215 pg/mL (ref 232–1245)

## 2020-10-26 LAB — URINE CULTURE

## 2020-10-31 LAB — TOXASSURE SELECT 13 (MW), URINE

## 2020-12-19 ENCOUNTER — Ambulatory Visit (INDEPENDENT_AMBULATORY_CARE_PROVIDER_SITE_OTHER): Payer: Medicare Other

## 2020-12-19 DIAGNOSIS — Z0000 Encounter for general adult medical examination without abnormal findings: Secondary | ICD-10-CM | POA: Diagnosis not present

## 2020-12-19 NOTE — Progress Notes (Signed)
MEDICARE ANNUAL WELLNESS VISIT  12/19/2020  Telephone Visit Disclaimer This Medicare AWV was conducted by telephone due to national recommendations for restrictions regarding the COVID-19 Pandemic (e.g. social distancing).  I verified, using two identifiers, that I am speaking with Kelly Peters or their authorized healthcare agent. I discussed the limitations, risks, security, and privacy concerns of performing an evaluation and management service by telephone and the potential availability of an in-person appointment in the future. The patient expressed understanding and agreed to proceed.  Location of Patient: Home Location of Provider (nurse):  [facility]  Subjective:    Kelly Peters is a 87 y.o. female patient of Hawks, Theador Hawthorne, FNP who had a Medicare Annual Wellness Visit today via telephone. Kelly Peters lives in nearby [place] in between her two daughters. She also has a son that lives in [place]. She is a widow and lives alone. She is a very pleasant lady. 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 She enjoys watching TV, word searches, and walking around. She doesn't have a regular exercise routine. She states that she does have a treadmill but doesn't use it. She just feels like she doesn't have a a lot of energy to exercise. She has completed the Covid vaccines eventhough it doesn't reflect that in her chart.   Patient Care Team: Sharion Balloon, FNP as PCP - General (Family Medicine)  Advanced Directives 12/19/2020 07/01/2020 07/01/2020 12/15/2019 12/14/2018 04/14/2018 04/13/2018  Does Patient Have a Medical Advance Directive? Yes Yes Yes Yes Yes - No  Type of Advance Directive Living will Living will Living will [place];Living will Living will - -  Does patient want to make changes to medical advance directive? - No - Patient declined - No - Patient declined No - Patient declined - -  Copy of [place] in Chart? - - - No - copy requested - - -  Would patient like information on creating a medical advance directive? - - - No - Patient declined - No - Patient declined -    Hospital Utilization Over the Past 12 Months: # of hospitalizations or ER visits: 0 # of surgeries: 0  Review of Systems    Patient reports that her overall health is unchanged compared to last year.  History obtained from chart review  Patient Reported Readings (BP, Pulse, CBG, Weight, etc) none  Pain Assessment Pain : No/denies pain     Current Medications & Allergies (verified) Allergies as of 12/19/2020      Reactions   Penicillins Rash   Has patient had a PCN reaction causing immediate rash, facial/tongue/throat swelling, SOB or lightheadedness with hypotension:No Has patient had a PCN reaction causing severe rash involving mucus membranes or skin necrosis:Yes Has patient had a PCN reaction that required hospitalization:No Has patient had a PCN reaction occurring within the last 10 years:No If all of the above answers are "NO", then may proceed with Cephalosporin use.      Medication List       Accurate as of December 19, 2020  8:39 AM. If you have any questions, ask your nurse or doctor.        albuterol 108 (90 Base) MCG/ACT inhaler Commonly known as: VENTOLIN HFA Inhale 2 puffs into the lungs QID.   ALPRAZolam 0.5 MG tablet Commonly known as: XANAX Take 1 tablet (0.5 mg total) by mouth 2 (two) times daily as needed for anxiety.  amLODipine 5 MG tablet Commonly known as: NORVASC Take 1 tablet (5 mg total) by mouth daily.   aspirin EC 81 MG tablet Take 81 mg by mouth daily.   citalopram 20 MG tablet Commonly known as: CELEXA Take 1 tablet (20 mg total) by mouth daily.   cyanocobalamin 1000 MCG/ML injection Commonly known as: (VITAMIN B-12) Inject daily for 5 days; then weekly for a month and then monthly after that.   omeprazole 20 MG capsule Commonly known as:  PRILOSEC Take 1 capsule (20 mg total) by mouth daily.   ondansetron 4 MG disintegrating tablet Commonly known as: ZOFRAN-ODT Take 4 mg by mouth every 8 (eight) hours as needed for nausea or vomiting.       History (reviewed): Past Medical History:  Diagnosis Date  . Allergy   . Anxiety   . COPD (chronic obstructive pulmonary disease) ([place])   . Depression   . Hypertension    Past Surgical History:  Procedure Laterality Date  . ABDOMINAL HYSTERECTOMY    . CATARACT EXTRACTION W/PHACO Left 10/03/2016   Procedure: CATARACT EXTRACTION PHACO AND INTRAOCULAR LENS PLACEMENT LEFT EYE CDE - 25.42;  Surgeon: Baruch Goldmann, MD;  Location: [facility];  Service: Ophthalmology;  Laterality: Left;  left  . CATARACT EXTRACTION W/PHACO Right 10/24/2016   Procedure: CATARACT EXTRACTION PHACO AND INTRAOCULAR LENS PLACEMENT RIGHT EYE CDE= 24.99;  Surgeon: Baruch Goldmann, MD;  Location: [facility];  Service: Ophthalmology;  Laterality: Right;  right   Family History  Problem Relation Age of Onset  . Stroke Mother   . Heart attack Father   . Heart attack Brother   . Diabetes Sister   . Stroke Sister   . COPD Daughter   . Hypertension Daughter   . Heart disease Son   . Hypertension Son   . Cancer Brother        kidney cancer  . Stroke Brother   . COPD Brother   . Diabetes Sister   . Thyroid disease Daughter    Social History   Socioeconomic History  . Marital status: Widowed    Spouse name: Not on file  . Number of children: 3  . Years of education: 7  . Highest education level: 7th grade  Occupational History  . Occupation: Retired  Tobacco Use  . Smoking status: Former Smoker    Packs/day: 0.50    Years: 58.00    Pack years: 29.00    Quit date: 07/23/2018    Years since quitting: 2.4  . Smokeless tobacco: Never Used  . Tobacco comment: down to 4-5 cigarettes a day 05/12/18  Vaping Use  . Vaping Use: Never used  Substance and Sexual Activity  . Alcohol use: Not Currently  . Drug use:  Never  . Sexual activity: Not Currently    Birth control/protection: Surgical  Other Topics Concern  . Not on file  Social History Narrative  . Not on file   Social Determinants of Health   Financial Resource Strain: Not on file  Food Insecurity: Not on file  Transportation Needs: Not on file  Physical Activity: Not on file  Stress: Not on file  Social Connections: Not on file    Activities of Daily Living In your present state of health, do you have any difficulty performing the following activities: 12/19/2020 07/01/2020  Hearing? N N  Vision? N N  Difficulty concentrating or making decisions? N N  Walking or climbing stairs? N N  Dressing or bathing? N N  Doing  errands, shopping? N N  Preparing Food and eating ? N -  Using the Toilet? N -  In the past six months, have you accidently leaked urine? Y -  Do you have problems with loss of bowel control? N -  Managing your Medications? N -  Managing your Finances? N -  Housekeeping or managing your Housekeeping? N -  Some recent data might be hidden    Patient Education/ Literacy How often do you need to have someone help you when you read instructions, pamphlets, or other written materials from your doctor or pharmacy?: 1 - Never What is the last grade level you completed in school?: 7th grade  Exercise Current Exercise Habits: The patient does not participate in regular exercise at present, Exercise limited by: respiratory conditions(s)  Diet Patient reports consuming 3 meals a day and 2 snack(s) a day Patient reports that her primary diet is: Regular, Low fat Patient reports that she does have regular access to food.   Depression Screen PHQ 2/9 Scores 12/19/2020 10/24/2020 07/12/2020 07/12/2020 03/23/2020 03/23/2020 12/15/2019  PHQ - 2 Score 0 0 0 0 0 0 4  PHQ- 9 Score 0 0 1 - 0 - 10     Fall Risk Fall Risk  12/19/2020 10/24/2020 07/12/2020 03/23/2020 12/15/2019  Falls in the past year? 0 0 0 0 0  Number falls in past yr: - 0  0 - 0  Injury with Fall? - 1 0 - 0  Risk for fall due to : - History of fall(s) No Fall Risks - No Fall Risks  Follow up - Education provided Falls evaluation completed - Falls evaluation completed     Objective:  Kelly Peters seemed alert and oriented and she participated appropriately during our telephone visit.  Blood Pressure Weight BMI  BP Readings from Last 3 Encounters:  10/24/20 114/62  07/12/20 114/63  07/03/20 139/76   Wt Readings from Last 3 Encounters:  10/24/20 125 lb 9.6 oz (57 kg)  07/12/20 126 lb (57.2 kg)  07/02/20 127 lb 3.3 oz (57.7 kg)   BMI Readings from Last 1 Encounters:  10/24/20 22.97 kg/m    *Unable to obtain current vital signs, weight, and BMI due to telephone visit type  Hearing/Vision  . Yicel did not seem to have difficulty with hearing/understanding during the telephone conversation . Reports that she has had a formal eye exam by an eye care professional within the past year . Reports that she has not had a formal hearing evaluation within the past year *Unable to fully assess hearing and vision during telephone visit type  Cognitive Function: 6CIT Screen 12/19/2020 12/15/2019 12/14/2018  What Year? 0 points 0 points 0 points  What month? 0 points 0 points 0 points  What time? 0 points 0 points 0 points  Count back from 20 2 points 0 points 2 points  Months in reverse 2 points 0 points 0 points  Repeat phrase 0 points 2 points 2 points  Total Score '4 2 4   '$ (Normal:0-7, Significant for Dysfunction: >8)  Normal Cognitive Function Screening: Yes   Immunization & Health Maintenance Record Immunization History  Administered Date(s) Administered  . Influenza, High Dose Seasonal PF 04/21/2018  . Tdap 07/01/2020    Health Maintenance  Topic Date Due  . Zoster Vaccines- Shingrix (1 of 2) 03/21/2021 (Originally 01/29/1982)  . DEXA SCAN  10/24/2021 (Originally 01/29/1997)  . PNA vac Low Risk Adult (1 of 2 - PCV13) 10/24/2021 (Originally  01/29/1997)  .  INFLUENZA VACCINE  02/18/2021  . TETANUS/TDAP  07/01/2030  . HPV VACCINES  Aged Out  . COVID-19 Vaccine  Discontinued       Assessment  This is a routine wellness examination for Kelly Peters.  Health Maintenance: Due or Overdue There are no preventive care reminders to display for this patient.  Kelly Peters does not need a referral for [employer] Assistance: Care Management:   no Social Work:    no Prescription Assistance:  no Nutrition/Diabetes Education:  no   Plan:  Personalized Goals Goals Addressed   None    Personalized Health Maintenance & Screening Recommendations  Shingles vaccines  Lung Cancer Screening Recommended: not applicable (Low Dose CT Chest recommended if Age 70-80 years, 30 pack-year currently smoking OR have quit w/in past 15 years) Hepatitis C Screening recommended: not applicable HIV Screening recommended: no  Advanced Directives: Written information was not prepared per patient's request. Patient states that she has a living will but can't locate it  Referrals & Orders No orders of the defined types were placed in this encounter.   Follow-up Plan . Follow-up with Sharion Balloon, FNP as planned . Schedule for shingles vaccine    I have personally reviewed and noted the following in the patient's chart:   . Medical and social history . Use of alcohol, tobacco or illicit drugs  . Current medications and supplements . Functional ability and status . Nutritional status . Physical activity . Advanced directives . List of other physicians . Hospitalizations, surgeries, and ER visits in previous 12 months . Vitals . Screenings to include cognitive, depression, and falls . Referrals and appointments  In addition, I have reviewed and discussed with Kelly Peters certain preventive protocols, quality metrics, and best practice recommendations. A written personalized care plan for preventive services as well as general  preventive health recommendations is available and can be mailed to the patient at her request.      Rolena Infante LPN [id number]

## 2020-12-19 NOTE — Patient Instructions (Signed)
  Kelly Peters , Thank you for taking time to come for your Medicare Wellness Visit. I appreciate your ongoing commitment to your health goals. Please review the following plan we discussed and let me know if I can assist you in the future.   These are the goals we discussed: Goals    . DIET - INCREASE WATER INTAKE    . Exercise 3x per week (30 min per time)     12/15/2019 AWV Goal: Exercise for General Health   Patient will verbalize understanding of the benefits of increased physical activity:  Exercising regularly is important. It will improve your overall fitness, flexibility, and endurance.  Regular exercise also will improve your overall health. It can help you control your weight, reduce stress, and improve your bone density.  Over the next year, patient will increase physical activity as tolerated with a goal of at least 150 minutes of moderate physical activity per week.   You can tell that you are exercising at a moderate intensity if your heart starts beating faster and you start breathing faster but can still hold a conversation.  Moderate-intensity exercise ideas include:  Walking 1 mile (1.6 km) in about 15 minutes  Biking  Hiking  Golfing  Dancing  Water aerobics  Patient will verbalize understanding of everyday activities that increase physical activity by providing examples like the following: ? Yard work, such as: ? Pushing a Conservation officer, nature ? Raking and bagging leaves ? Washing your car ? Pushing a stroller ? Shoveling snow ? Gardening ? Washing windows or floors  Patient will be able to explain general safety guidelines for exercising:   Before you start a new exercise program, talk with your health care provider.  Do not exercise so much that you hurt yourself, feel dizzy, or get very short of breath.  Wear comfortable clothes and wear shoes with good support.  Drink plenty of water while you exercise to prevent dehydration or heat stroke.  Work  out until your breathing and your heartbeat get faster.        This is a list of the screening recommended for you and due dates:  Health Maintenance  Topic Date Due  . Zoster (Shingles) Vaccine (1 of 2) 03/21/2021*  . DEXA scan (bone density measurement)  10/24/2021*  . Pneumonia vaccines (1 of 2 - PCV13) 10/24/2021*  . Flu Shot  02/18/2021  . Tetanus Vaccine  07/01/2030  . HPV Vaccine  Aged Out  . COVID-19 Vaccine  Discontinued  *Topic was postponed. The date shown is not the original due date.

## 2021-01-09 ENCOUNTER — Encounter: Payer: Self-pay | Admitting: Physician Assistant

## 2021-01-09 ENCOUNTER — Ambulatory Visit (INDEPENDENT_AMBULATORY_CARE_PROVIDER_SITE_OTHER): Payer: Medicare Other | Admitting: Physician Assistant

## 2021-01-09 DIAGNOSIS — J069 Acute upper respiratory infection, unspecified: Secondary | ICD-10-CM | POA: Diagnosis not present

## 2021-01-09 MED ORDER — SULFAMETHOXAZOLE-TRIMETHOPRIM 800-160 MG PO TABS: 1.0000 | ORAL_TABLET | Freq: Two times a day (BID) | ORAL | 0 refills | Status: DC

## 2021-01-09 NOTE — Patient Instructions (Signed)

## 2021-01-09 NOTE — Progress Notes (Signed)
   Virtual Visit  Note Due to COVID-19 pandemic this visit was conducted virtually. This visit type was conducted due to national recommendations for restrictions regarding the COVID-19 Pandemic (e.g. social distancing, sheltering in place) in an effort to limit this patient's exposure and mitigate transmission in our community. All issues noted in this document were discussed and addressed.  A physical exam was not performed with this format.  I connected with Kelly Peters on 01/09/21 at [place] by telephone and verified that I am speaking with the correct person using two identifiers. Kelly Peters is currently located at home and is currently with no one during visit. The provider, Thomasene Mohair, PA-C is located in their office at time of visit.  I discussed the limitations, risks, security and privacy concerns of performing an evaluation and management service by telephone and the availability of in person appointments. I also discussed with the patient that there may be a patient responsible charge related to this service. The patient expressed understanding and agreed to proceed.   History and Present Illness:  HPI Pt with a 4 day hx f sinus congestion with headache Headache has improved but now with sig. PND PND is production of yellow sputum Denies any fever/chills No meds for sx No COVID hx + vacc - Moderna At home test neg   Review of Systems  Constitutional: Negative.  Negative for fever.  HENT:  Positive for congestion. Negative for sinus pain and sore throat.   Respiratory:  Positive for sputum production. Negative for cough, hemoptysis, shortness of breath and wheezing.   Cardiovascular: Negative.   Genitourinary: Negative.  Negative for hematuria.  Musculoskeletal: Negative.     Observations/Objective: Defer  Assessment and Plan: URI  Fluids Rest OTC meds for sx Due to PCN allergy rx for Septra DS to pharmacy  Follow Up Instructions: prn    I discussed  the assessment and treatment plan with the patient. The patient was provided an opportunity to ask questions and all were answered. The patient agreed with the plan and demonstrated an understanding of the instructions.   The patient was advised to call back or seek an in-person evaluation if the symptoms worsen or if the condition fails to improve as anticipated.  The above assessment and management plan was discussed with the patient. The patient verbalized understanding of and has agreed to the management plan. Patient is aware to call the clinic if symptoms persist or worsen. Patient is aware when to return to the clinic for a follow-up visit. Patient educated on when it is appropriate to go to the emergency department.   Time call ended:    I provided 11 minutes of  non face-to-face time during this encounter.    Thomasene Mohair, PA-C

## 2021-01-24 ENCOUNTER — Ambulatory Visit: Payer: Medicare Other | Admitting: Family

## 2021-01-24 ENCOUNTER — Encounter: Payer: Self-pay | Admitting: Family

## 2021-02-01 ENCOUNTER — Other Ambulatory Visit: Payer: Self-pay | Admitting: Family

## 2021-02-01 DIAGNOSIS — F419 Anxiety disorder, unspecified: Secondary | ICD-10-CM

## 2021-02-01 DIAGNOSIS — Z79899 Other long term (current) drug therapy: Secondary | ICD-10-CM

## 2021-02-01 DIAGNOSIS — F1320 Sedative, hypnotic or anxiolytic dependence, uncomplicated: Secondary | ICD-10-CM

## 2021-02-05 ENCOUNTER — Ambulatory Visit (INDEPENDENT_AMBULATORY_CARE_PROVIDER_SITE_OTHER): Payer: Medicare Other | Admitting: Family Medicine

## 2021-02-05 ENCOUNTER — Encounter: Payer: Self-pay | Admitting: Family Medicine

## 2021-02-05 ENCOUNTER — Other Ambulatory Visit: Payer: Self-pay

## 2021-02-05 ENCOUNTER — Ambulatory Visit (INDEPENDENT_AMBULATORY_CARE_PROVIDER_SITE_OTHER): Payer: Medicare Other

## 2021-02-05 VITALS — BP 124/73 | HR 111 | Ht 62.0 in | Wt 122.0 lb

## 2021-02-05 DIAGNOSIS — R1032 Left lower quadrant pain: Secondary | ICD-10-CM

## 2021-02-05 DIAGNOSIS — R109 Unspecified abdominal pain: Secondary | ICD-10-CM | POA: Diagnosis not present

## 2021-02-05 NOTE — Progress Notes (Signed)
BP 124/73   Pulse (!) 111   Ht '5\' 2"'$  (1.575 m)   Wt 122 lb (55.3 kg)   SpO2 93%   BMI 22.31 kg/m    Subjective:   Patient ID: Kelly Peters, female    DOB: 1929-11-17, 87 y.o.   MRN: [id number]  HPI: Kelly Peters is a 87 y.o. female presenting on 02/05/2021 for Nausea, Emesis (Green in color. Thinks could be a gallbladder problem.), and Abdominal Pain (RUQ,LUQ, comes and goes. Feels like cramps)   HPI Patient comes in complaining of abdominal cramping and abdominal pain that is been going on.  She also gets some nausea.  This is been something that she has been fighting off and on for some time.  Patient says she gets nauseous and has green vomit at times but clear and liquidy.  She says her appetite has been going down and she has been losing weight because of it.  She does get dry heaves as well.  She complains of it being a crampy abdominal pain mostly on the left side but sometimes goes to the right side.  She denies any association with before or after food that she knows of.  She says it does get better after bowel movements.  She is having small frequent bowel movements throughout the day but denies any diarrhea.  Relevant past medical, surgical, family and social history reviewed and updated as indicated. Interim medical history since our last visit reviewed. Allergies and medications reviewed and updated.  Review of Systems  Constitutional:  Negative for chills and fever.  Eyes:  Negative for visual disturbance.  Respiratory:  Negative for chest tightness and shortness of breath.   Cardiovascular:  Negative for chest pain and leg swelling.  Gastrointestinal:  Positive for abdominal pain, nausea and vomiting. Negative for constipation and diarrhea.  Genitourinary:  Negative for difficulty urinating, dysuria, frequency and urgency.  Musculoskeletal:  Negative for back pain and gait problem.  Skin:  Negative for rash.  Neurological:  Negative for light-headedness and  headaches.  Psychiatric/Behavioral:  Negative for agitation and behavioral problems.   All other systems reviewed and are negative.  Per HPI unless specifically indicated above   Allergies as of 02/05/2021       Reactions   Penicillins Rash   Has patient had a PCN reaction causing immediate rash, facial/tongue/throat swelling, SOB or lightheadedness with hypotension:No Has patient had a PCN reaction causing severe rash involving mucus membranes or skin necrosis:Yes Has patient had a PCN reaction that required hospitalization:No Has patient had a PCN reaction occurring within the last 10 years:No If all of the above answers are "NO", then may proceed with Cephalosporin use.        Medication List        Accurate as of February 05, 2021 11:31 AM. If you have any questions, ask your nurse or doctor.          STOP taking these medications    ondansetron 4 MG disintegrating tablet Commonly known as: ZOFRAN-ODT Stopped by: Fransisca Kaufmann Jaydyn Bozzo, MD   sulfamethoxazole-trimethoprim 800-160 MG tablet Commonly known as: Bactrim DS Stopped by: Fransisca Kaufmann Jacquelyn Antony, MD       TAKE these medications    albuterol 108 (90 Base) MCG/ACT inhaler Commonly known as: VENTOLIN HFA Inhale 2 puffs into the lungs QID.   ALPRAZolam 0.5 MG tablet Commonly known as: XANAX Take 1 tablet (0.5 mg total) by mouth 2 (two) times daily as needed for anxiety.  amLODipine 5 MG tablet Commonly known as: NORVASC Take 1 tablet (5 mg total) by mouth daily.   aspirin EC 81 MG tablet Take 81 mg by mouth daily.   citalopram 20 MG tablet Commonly known as: CELEXA Take 1 tablet (20 mg total) by mouth daily.   cyanocobalamin 1000 MCG/ML injection Commonly known as: (VITAMIN B-12) Inject daily for 5 days; then weekly for a month and then monthly after that.   omeprazole 20 MG capsule Commonly known as: PRILOSEC Take 1 capsule (20 mg total) by mouth daily.         Objective:   BP 124/73   Pulse  (!) 111   Ht '5\' 2"'$  (1.575 m)   Wt 122 lb (55.3 kg)   SpO2 93%   BMI 22.31 kg/m   Wt Readings from Last 3 Encounters:  02/05/21 122 lb (55.3 kg)  10/24/20 125 lb 9.6 oz (57 kg)  07/12/20 126 lb (57.2 kg)    Physical Exam Vitals and nursing note reviewed.  Constitutional:      General: She is not in acute distress.    Appearance: She is well-developed. She is not diaphoretic.  Eyes:     Conjunctiva/sclera: Conjunctivae normal.     Pupils: Pupils are equal, round, and reactive to light.  Cardiovascular:     Rate and Rhythm: Normal rate and regular rhythm.     Heart sounds: Normal heart sounds. No murmur heard. Pulmonary:     Effort: Pulmonary effort is normal. No respiratory distress.     Breath sounds: Normal breath sounds. No wheezing.  Abdominal:     General: Abdomen is flat. Bowel sounds are normal.     Palpations: Abdomen is soft. There is no hepatomegaly, splenomegaly or mass.     Tenderness: There is abdominal tenderness in the left lower quadrant. There is no right CVA tenderness or left CVA tenderness. Negative signs include Murphy's sign and McBurney's sign.  Musculoskeletal:        General: No tenderness. Normal range of motion.  Skin:    General: Skin is warm and dry.     Findings: No rash.  Neurological:     Mental Status: She is alert and oriented to person, place, and time.     Coordination: Coordination normal.  Psychiatric:        Behavior: Behavior normal.    KUB: Mild amount of stool, will see if we can clean that out reset the system.  Assessment & Plan:   Problem List Items Addressed This Visit   None Visit Diagnoses     Left lower quadrant abdominal pain    -  Primary   Relevant Orders   DG Abd 1 View       Recommend MiraLAX daily for 2 weeks and drink plenty of water with it and flush things out and then return if not improved.  On the x-ray it looks more like she had a hard large stool near the end of the colon. Follow up plan: Return if  symptoms worsen or fail to improve.  Counseling provided for all of the vaccine components Orders Placed This Encounter  Procedures   DG Abd [place] Vash Quezada, MD [facility] 02/05/2021, 11:31 AM

## 2021-02-07 ENCOUNTER — Telehealth: Payer: Self-pay | Admitting: Family

## 2021-02-07 NOTE — Telephone Encounter (Signed)
Vit B 12 labs normal, no need to start injections.

## 2021-02-07 NOTE — Telephone Encounter (Signed)
Patient aware and verbalized understanding. °

## 2021-02-11 ENCOUNTER — Ambulatory Visit (INDEPENDENT_AMBULATORY_CARE_PROVIDER_SITE_OTHER): Payer: Medicare Other | Admitting: Nurse Practitioner

## 2021-02-11 ENCOUNTER — Encounter: Payer: Self-pay | Admitting: Nurse Practitioner

## 2021-02-11 DIAGNOSIS — J069 Acute upper respiratory infection, unspecified: Secondary | ICD-10-CM | POA: Insufficient documentation

## 2021-02-11 MED ORDER — DOXYCYCLINE HYCLATE 100 MG PO TABS: 100.0000 mg | ORAL_TABLET | Freq: Two times a day (BID) | ORAL | 0 refills | Status: DC

## 2021-02-11 NOTE — Progress Notes (Signed)
   Virtual Visit  Note Due to COVID-19 pandemic this visit was conducted virtually. This visit type was conducted due to national recommendations for restrictions regarding the COVID-19 Pandemic (e.g. social distancing, sheltering in place) in an effort to limit this patient's exposure and mitigate transmission in our community. All issues noted in this document were discussed and addressed.  A physical exam was not performed with this format.   I connected with Kelly Peters on 02/11/21 at 8: 35 am by telephone and verified that I am speaking with the correct person using two identifiers. Kelly Peters is currently located at home during visit. The provider, Ivy Lynn, NP is located in their office at time of visit.  I discussed the limitations, risks, security and privacy concerns of performing an evaluation and management service by telephone and the availability of in person appointments. I also discussed with the patient that there may be a patient responsible charge related to this service. The patient expressed understanding and agreed to proceed.   History and Present Illness:  URI  This is a recurrent problem. The current episode started 1 to 4 weeks ago. The problem has been unchanged. There has been no fever. Associated symptoms include congestion and coughing. Pertinent negatives include no headaches, joint swelling, nausea, rash, sneezing or sore throat.     Review of Systems  HENT:  Positive for congestion. Negative for sneezing and sore throat.   Respiratory:  Positive for cough.   Gastrointestinal:  Negative for nausea.  Skin:  Negative for rash.  Neurological:  Negative for headaches.  All other systems reviewed and are negative.   Observations/Objective:  Televisit patient not in distress. Assessment and Plan: Symptoms of worsening congestion and slight cough not new for patient, in the last 2 to 3 weeks. Advised patient to take medication as prescribed,  increase hydration, doxycycline 100 mg tablet by mouth twice daily.  Follow Up Instructions:   Follow-up for worsening unresolved symptoms. Doxycycline 100 mg tablet by mouth twice daily.   I discussed the assessment and treatment plan with the patient. The patient was provided an opportunity to ask questions and all were answered. The patient agreed with the plan and demonstrated an understanding of the instructions.   The patient was advised to call back or seek an in-person evaluation if the symptoms worsen or if the condition fails to improve as anticipated.  The above assessment and management plan was discussed with the patient. The patient verbalized understanding of and has agreed to the management plan. Patient is aware to call the clinic if symptoms persist or worsen. Patient is aware when to return to the clinic for a follow-up visit. Patient educated on when it is appropriate to go to the emergency department.   Time call ended: 8:46 AM  I provided 11 minutes of  non face-to-face time during this encounter.    Ivy Lynn, NP

## 2021-02-11 NOTE — Assessment & Plan Note (Signed)
Symptoms of worsening congestion and slight cough not new for patient, in the last 2 to 3 weeks. Advised patient to take medication as prescribed, increase hydration, doxycycline 100 mg tablet by mouth twice daily

## 2021-03-11 ENCOUNTER — Other Ambulatory Visit: Payer: Self-pay | Admitting: Family

## 2021-03-11 DIAGNOSIS — Z79899 Other long term (current) drug therapy: Secondary | ICD-10-CM

## 2021-03-11 DIAGNOSIS — F1320 Sedative, hypnotic or anxiolytic dependence, uncomplicated: Secondary | ICD-10-CM

## 2021-03-11 DIAGNOSIS — F419 Anxiety disorder, unspecified: Secondary | ICD-10-CM

## 2021-03-13 ENCOUNTER — Encounter: Payer: Self-pay | Admitting: Family

## 2021-03-13 ENCOUNTER — Ambulatory Visit (INDEPENDENT_AMBULATORY_CARE_PROVIDER_SITE_OTHER): Payer: Medicare Other | Admitting: Family

## 2021-03-13 ENCOUNTER — Other Ambulatory Visit: Payer: Self-pay

## 2021-03-13 VITALS — BP 94/56 | HR 84 | Temp 97.1°F | Ht 62.0 in | Wt 124.8 lb

## 2021-03-13 DIAGNOSIS — I959 Hypotension, unspecified: Secondary | ICD-10-CM | POA: Diagnosis not present

## 2021-03-13 DIAGNOSIS — J449 Chronic obstructive pulmonary disease, unspecified: Secondary | ICD-10-CM

## 2021-03-13 DIAGNOSIS — K5900 Constipation, unspecified: Secondary | ICD-10-CM

## 2021-03-13 DIAGNOSIS — I10 Essential (primary) hypertension: Secondary | ICD-10-CM | POA: Diagnosis not present

## 2021-03-13 DIAGNOSIS — F419 Anxiety disorder, unspecified: Secondary | ICD-10-CM | POA: Diagnosis not present

## 2021-03-13 DIAGNOSIS — F1320 Sedative, hypnotic or anxiolytic dependence, uncomplicated: Secondary | ICD-10-CM | POA: Diagnosis not present

## 2021-03-13 DIAGNOSIS — Z79899 Other long term (current) drug therapy: Secondary | ICD-10-CM | POA: Diagnosis not present

## 2021-03-13 DIAGNOSIS — F339 Major depressive disorder, recurrent, unspecified: Secondary | ICD-10-CM

## 2021-03-13 MED ORDER — ALPRAZOLAM 0.5 MG PO TABS: 0.5000 mg | ORAL_TABLET | Freq: Two times a day (BID) | ORAL | 2 refills | Status: DC | PRN

## 2021-03-13 NOTE — Patient Instructions (Signed)
Insomnia Insomnia is a sleep disorder that makes it difficult to fall asleep or stay asleep. Insomnia can cause fatigue, low energy, difficulty concentrating, moodswings, and poor performance at work or school. There are three different ways to classify insomnia: Difficulty falling asleep. Difficulty staying asleep. Waking up too early in the morning. Any type of insomnia can be long-term (chronic) or short-term (acute). Both are common. Short-term insomnia usually lasts for three months or less. Chronic insomnia occurs at least three times a week for longer than threemonths. What are the causes? Insomnia may be caused by another condition, situation, or substance, such as: Anxiety. Certain medicines. Gastroesophageal reflux disease (GERD) or other gastrointestinal conditions. Asthma or other breathing conditions. Restless legs syndrome, sleep apnea, or other sleep disorders. Chronic pain. Menopause. Stroke. Abuse of alcohol, tobacco, or illegal drugs. Mental health conditions, such as depression. Caffeine. Neurological disorders, such as Alzheimer's disease. An overactive thyroid (hyperthyroidism). Sometimes, the cause of insomnia may not be known. What increases the risk? Risk factors for insomnia include: Gender. Women are affected more often than men. Age. Insomnia is more common as you get older. Stress. Lack of exercise. Irregular work schedule or working night shifts. Traveling between different time zones. Certain medical and mental health conditions. What are the signs or symptoms? If you have insomnia, the main symptom is having trouble falling asleep or having trouble staying asleep. This may lead to other symptoms, such as: Feeling fatigued or having low energy. Feeling nervous about going to sleep. Not feeling rested in the morning. Having trouble concentrating. Feeling irritable, anxious, or depressed. How is this diagnosed? This condition may be diagnosed based  on: Your symptoms and medical history. Your health care provider may ask about: Your sleep habits. Any medical conditions you have. Your mental health. A physical exam. How is this treated? Treatment for insomnia depends on the cause. Treatment may focus on treating an underlying condition that is causing insomnia. Treatment may also include: Medicines to help you sleep. Counseling or therapy. Lifestyle adjustments to help you sleep better. Follow these instructions at home: Eating and drinking  Limit or avoid alcohol, caffeinated beverages, and cigarettes, especially close to bedtime. These can disrupt your sleep. Do not eat a large meal or eat spicy foods right before bedtime. This can lead to digestive discomfort that can make it hard for you to sleep.  Sleep habits  Keep a sleep diary to help you and your health care provider figure out what could be causing your insomnia. Write down: When you sleep. When you wake up during the night. How well you sleep. How rested you feel the next day. Any side effects of medicines you are taking. What you eat and drink. Make your bedroom a dark, comfortable place where it is easy to fall asleep. Put up shades or blackout curtains to block light from outside. Use a white noise machine to block noise. Keep the temperature cool. Limit screen use before bedtime. This includes: Watching TV. Using your smartphone, tablet, or computer. Stick to a routine that includes going to bed and waking up at the same times every day and night. This can help you fall asleep faster. Consider making a quiet activity, such as reading, part of your nighttime routine. Try to avoid taking naps during the day so that you sleep better at night. Get out of bed if you are still awake after 15 minutes of trying to sleep. Keep the lights down, but try reading or doing a quiet   activity. When you feel sleepy, go back to bed.  General instructions Take over-the-counter  and prescription medicines only as told by your health care provider. Exercise regularly, as told by your health care provider. Avoid exercise starting several hours before bedtime. Use relaxation techniques to manage stress. Ask your health care provider to suggest some techniques that may work well for you. These may include: Breathing exercises. Routines to release muscle tension. Visualizing peaceful scenes. Make sure that you drive carefully. Avoid driving if you feel very sleepy. Keep all follow-up visits as told by your health care provider. This is important. Contact a health care provider if: You are tired throughout the day. You have trouble in your daily routine due to sleepiness. You continue to have sleep problems, or your sleep problems get worse. Get help right away if: You have serious thoughts about hurting yourself or someone else. If you ever feel like you may hurt yourself or others, or have thoughts about taking your own life, get help right away. You can go to your nearest emergency department or call: Your local emergency services (911 in the [place] A suicide crisis helpline, such as the National Suicide Prevention Lifeline at 1-800-273-8255. This is open 24 hours a day. Summary Insomnia is a sleep disorder that makes it difficult to fall asleep or stay asleep. Insomnia can be long-term (chronic) or short-term (acute). Treatment for insomnia depends on the cause. Treatment may focus on treating an underlying condition that is causing insomnia. Keep a sleep diary to help you and your health care provider figure out what could be causing your insomnia. This information is not intended to replace advice given to you by your health care provider. Make sure you discuss any questions you have with your healthcare provider. Document Revised: 05/17/2020 Document Reviewed: 05/17/2020 Elsevier Patient Education  2022 [employer]  

## 2021-03-13 NOTE — Progress Notes (Signed)
Subjective:    Patient ID: Kelly Peters, female    DOB: 10/14/1929, 87 y.o.   MRN: [id number]  Chief Complaint  Patient presents with   Medication Refill   PT presents to the office today for chronic follow up. Hypertension This is a chronic problem. The current episode started more than 1 year ago. The problem has been resolved since onset. The problem is controlled. Associated symptoms include anxiety. Pertinent negatives include no malaise/fatigue, peripheral edema or shortness of breath. Risk factors for coronary artery disease include dyslipidemia. Past treatments include calcium channel blockers. The current treatment provides moderate improvement.  Depression        This is a chronic problem.  The current episode started more than 1 year ago.   The onset quality is gradual.   The problem occurs intermittently.  Associated symptoms include restlessness, decreased interest and sad.  Associated symptoms include no helplessness and no hopelessness.  Past treatments include SSRIs - Selective serotonin reuptake inhibitors.  Compliance with treatment is good.  Past medical history includes anxiety.   Constipation This is a chronic problem. The current episode started more than 1 year ago. The problem has been resolved since onset. Her stool frequency is 1 time per day. She has tried laxatives for the symptoms. The treatment provided moderate relief.  Anxiety Presents for follow-up visit. Symptoms include depressed mood, excessive worry, nervous/anxious behavior and restlessness. Patient reports no shortness of breath. Symptoms occur occasionally. The severity of symptoms is moderate.    COPD States her breathing is stable.   Review of Systems  Constitutional:  Negative for malaise/fatigue.  Respiratory:  Negative for shortness of breath.   Gastrointestinal:  Positive for constipation.  Psychiatric/Behavioral:  Positive for depression. The patient is nervous/anxious.   All other  systems reviewed and are negative.     Objective:   Physical Exam Vitals reviewed.  Constitutional:      General: She is not in acute distress.    Appearance: She is well-developed.  HENT:     Head: Normocephalic and atraumatic.     Right Ear: Tympanic membrane normal.     Left Ear: Tympanic membrane normal.  Eyes:     Pupils: Pupils are equal, round, and reactive to light.  Neck:     Thyroid: No thyromegaly.  Cardiovascular:     Rate and Rhythm: Normal rate and regular rhythm.     Heart sounds: Normal heart sounds. No murmur heard. Pulmonary:     Effort: Pulmonary effort is normal. No respiratory distress.     Breath sounds: Rhonchi present. No wheezing.  Abdominal:     General: Bowel sounds are normal. There is no distension.     Palpations: Abdomen is soft.     Tenderness: There is no abdominal tenderness.  Musculoskeletal:        General: No tenderness. Normal range of motion.     Cervical back: Normal range of motion and neck supple.  Skin:    General: Skin is warm and dry.  Neurological:     Mental Status: She is alert and oriented to person, place, and time.     Cranial Nerves: No cranial nerve deficit.     Deep Tendon Reflexes: Reflexes are normal and symmetric.  Psychiatric:        Behavior: Behavior normal.        Thought Content: Thought content normal.        Judgment: Judgment normal.      BP Marland Kitchen)  94/56   Pulse 84   Temp (!) 97.1 F (36.2 C)   Ht '5\' 2"'  (1.575 m)   Wt 124 lb 12.8 oz (56.6 kg)   SpO2 94%   BMI 22.83 kg/m      Assessment & Plan:  Kelly Peters comes in today with chief complaint of Medication Refill   Diagnosis and orders addressed:  1. Anxiety Pt will decrease xanax to 0.25 mg daily Stress management  - ALPRAZolam (XANAX) 0.5 MG tablet; Take 1 tablet (0.5 mg total) by mouth 2 (two) times daily as needed for anxiety.  Dispense: 45 tablet; Refill: 2 - BMP8+EGFR  2. Benzodiazepine dependence (HCC) - ALPRAZolam (XANAX) 0.5  MG tablet; Take 1 tablet (0.5 mg total) by mouth 2 (two) times daily as needed for anxiety.  Dispense: 45 tablet; Refill: 2 - BMP8+EGFR  3. Controlled substance agreement signed - ALPRAZolam (XANAX) 0.5 MG tablet; Take 1 tablet (0.5 mg total) by mouth 2 (two) times daily as needed for anxiety.  Dispense: 45 tablet; Refill: 2 - BMP8+EGFR  4. Essential hypertension - BMP8+EGFR  5. Moderate COPD (chronic obstructive pulmonary disease) (HCC) - BMP8+EGFR  6. Depression, recurrent (HCC)  - BMP8+EGFR  7. Constipation, unspecified constipation type - BMP8+EGFR  8. Hypotension, unspecified hypotension type Stop Norvasc 5 mg today - BMP8+EGFR   Labs pending Health Maintenance reviewed Diet and exercise encouraged  Follow up plan: 2 weeks to recheck BP since stopping Norvasc 5 mg   [name], FNP

## 2021-03-14 LAB — BMP8+EGFR
BUN/Creatinine Ratio: 17 (ref 12–28)
BUN: 24 mg/dL (ref 8–27)
CO2: 25 mmol/L (ref 20–29)
Calcium: 8.9 mg/dL (ref 8.7–10.3)
Chloride: 102 mmol/L (ref 96–106)
Creatinine, Ser: 1.4 mg/dL — ABNORMAL HIGH (ref 0.57–1.00)
Glucose: 101 mg/dL — ABNORMAL HIGH (ref 65–99)
Potassium: 5.1 mmol/L (ref 3.5–5.2)
Sodium: 141 mmol/L (ref 134–144)
eGFR: 36 mL/min/{1.73_m2} — ABNORMAL LOW (ref >59)

## 2021-03-28 ENCOUNTER — Other Ambulatory Visit: Payer: Self-pay

## 2021-03-28 ENCOUNTER — Encounter: Payer: Self-pay | Admitting: Family

## 2021-03-28 ENCOUNTER — Ambulatory Visit (INDEPENDENT_AMBULATORY_CARE_PROVIDER_SITE_OTHER): Payer: Medicare Other | Admitting: Family

## 2021-03-28 VITALS — BP 131/74 | HR 100 | Temp 98.2°F | Ht 62.0 in | Wt 126.8 lb

## 2021-03-28 DIAGNOSIS — F419 Anxiety disorder, unspecified: Secondary | ICD-10-CM

## 2021-03-28 DIAGNOSIS — I10 Essential (primary) hypertension: Secondary | ICD-10-CM | POA: Diagnosis not present

## 2021-03-28 NOTE — Progress Notes (Signed)
   Subjective:    Patient ID: Kelly Peters, female    DOB: 05/29/1930, 87 y.o.   MRN: [id number]  Chief Complaint  Patient presents with   Hypertension    2 week follow up    Pt presents to the office today to recheck hypotension. She was seen on 03/13/21 and we decreased her xanax to 0.25 mg daily and stopped her Norvasc 5 mg. Her BP is  Hypertension This is a chronic problem. The problem has been resolved since onset. The problem is controlled. Associated symptoms include anxiety. Pertinent negatives include no malaise/fatigue, peripheral edema or shortness of breath. Risk factors for coronary artery disease include dyslipidemia and sedentary lifestyle. The current treatment provides moderate improvement.  Anxiety Presents for follow-up visit. Symptoms include excessive worry, insomnia, irritability and nervous/anxious behavior. Patient reports no shortness of breath. The severity of symptoms is moderate.       Review of Systems  Constitutional:  Positive for irritability. Negative for malaise/fatigue.  Respiratory:  Negative for shortness of breath.   Psychiatric/Behavioral:  The patient is nervous/anxious and has insomnia.   All other systems reviewed and are negative.     Objective:   Physical Exam Vitals reviewed.  Constitutional:      General: She is not in acute distress.    Appearance: She is well-developed.  HENT:     Head: Normocephalic and atraumatic.     Right Ear: Tympanic membrane normal.     Left Ear: Tympanic membrane normal.  Eyes:     Pupils: Pupils are equal, round, and reactive to light.  Neck:     Thyroid: No thyromegaly.  Cardiovascular:     Rate and Rhythm: Normal rate and regular rhythm.     Heart sounds: Normal heart sounds. No murmur heard. Pulmonary:     Effort: Pulmonary effort is normal. No respiratory distress.     Breath sounds: Normal breath sounds. No wheezing.  Abdominal:     General: Bowel sounds are normal. There is no distension.      Palpations: Abdomen is soft.     Tenderness: There is no abdominal tenderness.  Musculoskeletal:        General: No tenderness. Normal range of motion.     Cervical back: Normal range of motion and neck supple.  Skin:    General: Skin is warm and dry.  Neurological:     Mental Status: She is alert and oriented to person, place, and time.     Cranial Nerves: No cranial nerve deficit.     Deep Tendon Reflexes: Reflexes are normal and symmetric.  Psychiatric:        Behavior: Behavior normal.        Thought Content: Thought content normal.        Judgment: Judgment normal.      BP 131/74   Pulse 100   Temp 98.2 F (36.8 C) (Temporal)   Ht '5\' 2"'$  (1.575 m)   Wt 126 lb 12.8 oz (57.5 kg)   BMI 23.19 kg/m      Assessment & Plan:  Kelly Peters comes in today with chief complaint of Hypertension (2 week follow up )   Diagnosis and orders addressed:  1. Essential hypertension At goal Continue to hold Norvasc 5 mg  2. Anxiety Xanax as needed, continue to try to decrease xanax and tamper off    Follow up plan: 3 months    Kelly Dun, FNP

## 2021-03-28 NOTE — Patient Instructions (Signed)

## 2021-04-16 DIAGNOSIS — H2513 Age-related nuclear cataract, bilateral: Secondary | ICD-10-CM | POA: Diagnosis not present

## 2021-04-16 DIAGNOSIS — H40033 Anatomical narrow angle, bilateral: Secondary | ICD-10-CM | POA: Diagnosis not present

## 2021-04-22 DIAGNOSIS — H26493 Other secondary cataract, bilateral: Secondary | ICD-10-CM | POA: Diagnosis not present

## 2021-06-03 ENCOUNTER — Other Ambulatory Visit: Payer: Self-pay | Admitting: Family

## 2021-06-03 DIAGNOSIS — Z79899 Other long term (current) drug therapy: Secondary | ICD-10-CM

## 2021-06-03 DIAGNOSIS — F419 Anxiety disorder, unspecified: Secondary | ICD-10-CM

## 2021-06-03 DIAGNOSIS — F1320 Sedative, hypnotic or anxiolytic dependence, uncomplicated: Secondary | ICD-10-CM

## 2021-07-02 ENCOUNTER — Ambulatory Visit (INDEPENDENT_AMBULATORY_CARE_PROVIDER_SITE_OTHER): Payer: Medicare Other | Admitting: Family

## 2021-07-02 ENCOUNTER — Encounter: Payer: Self-pay | Admitting: Family

## 2021-07-02 VITALS — BP 159/84 | HR 93 | Temp 97.8°F | Ht 60.0 in | Wt 132.4 lb

## 2021-07-02 DIAGNOSIS — J432 Centrilobular emphysema: Secondary | ICD-10-CM | POA: Diagnosis not present

## 2021-07-02 DIAGNOSIS — F1320 Sedative, hypnotic or anxiolytic dependence, uncomplicated: Secondary | ICD-10-CM | POA: Diagnosis not present

## 2021-07-02 DIAGNOSIS — K219 Gastro-esophageal reflux disease without esophagitis: Secondary | ICD-10-CM

## 2021-07-02 DIAGNOSIS — F419 Anxiety disorder, unspecified: Secondary | ICD-10-CM

## 2021-07-02 DIAGNOSIS — Z79899 Other long term (current) drug therapy: Secondary | ICD-10-CM | POA: Diagnosis not present

## 2021-07-02 DIAGNOSIS — R300 Dysuria: Secondary | ICD-10-CM | POA: Diagnosis not present

## 2021-07-02 DIAGNOSIS — K2100 Gastro-esophageal reflux disease with esophagitis, without bleeding: Secondary | ICD-10-CM | POA: Insufficient documentation

## 2021-07-02 DIAGNOSIS — F339 Major depressive disorder, recurrent, unspecified: Secondary | ICD-10-CM

## 2021-07-02 LAB — URINALYSIS, COMPLETE
Bilirubin, UA: NEGATIVE
Glucose, UA: NEGATIVE
Ketones, UA: NEGATIVE
Nitrite, UA: NEGATIVE
Protein,UA: NEGATIVE
RBC, UA: NEGATIVE
Specific Gravity, UA: 1.015 (ref 1.005–1.030)
Urobilinogen, Ur: 0.2 mg/dL (ref 0.2–1.0)
pH, UA: 5.5 (ref 5.0–7.5)

## 2021-07-02 LAB — MICROSCOPIC EXAMINATION: Renal Epithel, UA: NONE SEEN /hpf

## 2021-07-02 MED ORDER — ALBUTEROL SULFATE HFA 108 (90 BASE) MCG/ACT IN AERS
2.0000 | INHALATION_SPRAY | Freq: Four times a day (QID) | RESPIRATORY_TRACT | 2 refills | Status: DC
Start: 2021-07-02 — End: 2023-09-15

## 2021-07-02 MED ORDER — CITALOPRAM HYDROBROMIDE 20 MG PO TABS: 20.0000 mg | ORAL_TABLET | Freq: Every day | ORAL | 3 refills | Status: DC

## 2021-07-02 MED ORDER — OMEPRAZOLE 20 MG PO CPDR: 20.0000 mg | DELAYED_RELEASE_CAPSULE | Freq: Every day | ORAL | 11 refills | Status: DC

## 2021-07-02 MED ORDER — ALPRAZOLAM 0.5 MG PO TABS: 0.5000 mg | ORAL_TABLET | Freq: Two times a day (BID) | ORAL | 2 refills | Status: DC | PRN

## 2021-07-02 NOTE — Addendum Note (Signed)
Addended by: [name] A on: 07/02/2021 03:07 PM   Modules accepted: Orders

## 2021-07-02 NOTE — Progress Notes (Signed)
Subjective:    Patient ID: Kelly Peters, female    DOB: 05/03/1930, 87 y.o.   MRN: [id number]  Chief Complaint  Patient presents with   Medical Management of Chronic Issues   PT presents to the office today for chronic follow up. She has COPD and states her breathing is "fine". She has an albuterol, but doesn't have to use it. Quit smoking 07/2018. Hypertension This is a chronic problem. The current episode started more than 1 year ago. The problem has been waxing and waning since onset. The problem is uncontrolled. Associated symptoms include anxiety and malaise/fatigue. Pertinent negatives include no peripheral edema or shortness of breath. Risk factors for coronary artery disease include dyslipidemia and sedentary lifestyle. The current treatment provides moderate improvement.  Depression        This is a chronic problem.  The current episode started more than 1 year ago.   The onset quality is gradual.   The problem occurs intermittently.  Associated symptoms include restlessness.  Associated symptoms include no helplessness, no hopelessness, not irritable and not sad.  Past treatments include SSRIs - Selective serotonin reuptake inhibitors.  Past medical history includes anxiety.   Anxiety Presents for follow-up visit. Symptoms include depressed mood, excessive worry, irritability, nervous/anxious behavior and restlessness. Patient reports no shortness of breath. Symptoms occur occasionally. The severity of symptoms is moderate.    Constipation This is a chronic problem. The current episode started more than 1 year ago. The problem has been waxing and waning since onset. The treatment provided moderate relief.  Gastroesophageal Reflux She complains of belching and heartburn. This is a chronic problem. The current episode started more than 1 year ago. The problem occurs occasionally. The problem has been waxing and waning. She has tried a PPI for the symptoms. The treatment provided  moderate relief.  Dysuria  This is a new problem. The current episode started in the past 7 days. There has been no fever. Associated symptoms include frequency and hesitancy. Pertinent negatives include no hematuria. She has tried increased fluids for the symptoms. The treatment provided moderate relief.     Review of Systems  Constitutional:  Positive for irritability and malaise/fatigue.  Respiratory:  Negative for shortness of breath.   Gastrointestinal:  Positive for constipation and heartburn.  Genitourinary:  Positive for dysuria, frequency and hesitancy. Negative for hematuria.  Psychiatric/Behavioral:  Positive for depression. The patient is nervous/anxious.   All other systems reviewed and are negative.     Objective:   Physical Exam Vitals reviewed.  Constitutional:      General: She is not irritable.She is not in acute distress.    Appearance: She is well-developed. She is obese.  HENT:     Head: Normocephalic and atraumatic.     Right Ear: Tympanic membrane normal.     Left Ear: Tympanic membrane normal.  Eyes:     Pupils: Pupils are equal, round, and reactive to light.  Neck:     Thyroid: No thyromegaly.  Cardiovascular:     Rate and Rhythm: Normal rate and regular rhythm.     Heart sounds: Normal heart sounds. No murmur heard. Pulmonary:     Effort: Pulmonary effort is normal. No respiratory distress.     Breath sounds: Normal breath sounds. No wheezing.  Abdominal:     General: Bowel sounds are normal. There is no distension.     Palpations: Abdomen is soft.     Tenderness: There is no abdominal tenderness.  Musculoskeletal:  General: No tenderness. Normal range of motion.     Cervical back: Normal range of motion and neck supple.  Skin:    General: Skin is warm and dry.  Neurological:     Mental Status: She is alert and oriented to person, place, and time.     Cranial Nerves: No cranial nerve deficit.     Deep Tendon Reflexes: Reflexes are normal  and symmetric.  Psychiatric:        Behavior: Behavior normal.        Thought Content: Thought content normal.        Judgment: Judgment normal.      BP (!) 159/84    Pulse 93    Temp 97.8 F (36.6 C) (Temporal)    Ht 5' (1.524 m)    Wt 132 lb 6.4 oz (60.1 kg)    BMI 25.86 kg/m      Assessment & Plan:  Kelly Peters comes in today with chief complaint of Medical Management of Chronic Issues   Diagnosis and orders addressed:  1. Anxiety - ALPRAZolam (XANAX) 0.5 MG tablet; Take 1 tablet (0.5 mg total) by mouth 2 (two) times daily as needed for anxiety.  Dispense: 45 tablet; Refill: 2 - citalopram (CELEXA) 20 MG tablet; Take 1 tablet (20 mg total) by mouth daily.  Dispense: 90 tablet; Refill: 3 - CMP14+EGFR  2. Benzodiazepine dependence (HCC) - ALPRAZolam (XANAX) 0.5 MG tablet; Take 1 tablet (0.5 mg total) by mouth 2 (two) times daily as needed for anxiety.  Dispense: 45 tablet; Refill: 2 - CMP14+EGFR  3. Controlled substance agreement signed - ALPRAZolam (XANAX) 0.5 MG tablet; Take 1 tablet (0.5 mg total) by mouth 2 (two) times daily as needed for anxiety.  Dispense: 45 tablet; Refill: 2 - CMP14+EGFR  4. Depression, recurrent ([place]) - citalopram (CELEXA) 20 MG tablet; Take 1 tablet (20 mg total) by mouth daily.  Dispense: 90 tablet; Refill: 3 - CMP14+EGFR  5. Gastroesophageal reflux disease, unspecified whether esophagitis present - omeprazole (PRILOSEC) 20 MG capsule; Take 1 capsule (20 mg total) by mouth daily.  Dispense: 30 capsule; Refill: 11 - CMP14+EGFR  6. Centrilobular emphysema (HCC) - albuterol (VENTOLIN HFA) 108 (90 Base) MCG/ACT inhaler; Inhale 2 puffs into the lungs QID.  Dispense: 8 g; Refill: 2 - CMP14+EGFR  7. Gastroesophageal reflux disease with esophagitis, unspecified whether hemorrhage - CMP14+EGFR  8. Dysuria - Urinalysis, Complete - CMP14+EGFR   Labs pending Patient reviewed in [place] controlled database, no flags noted. Contract and drug screen  are up dated today Health Maintenance reviewed Diet and exercise encouraged  Follow up plan: 3 months    [name], FNP

## 2021-07-02 NOTE — Patient Instructions (Signed)
Health Maintenance After Age 85 After age 85, you are at a higher risk for certain long-term diseases and infections as well as injuries from falls. Falls are a major cause of broken bones and head injuries in people who are older than age 85. Getting regular preventive care can help to keep you healthy and well. Preventive care includes getting regular testing and making lifestyle changes as recommended by your health care provider. Talk with your health care provider about: Which screenings and tests you should have. A screening is a test that checks for a disease when you have no symptoms. A diet and exercise plan that is right for you. What should I know about screenings and tests to prevent falls? Screening and testing are the best ways to find a health problem early. Early diagnosis and treatment give you the best chance of managing medical conditions that are common after age 85. Certain conditions and lifestyle choices may make you more likely to have a fall. Your health care provider may recommend: Regular vision checks. Poor vision and conditions such as cataracts can make you more likely to have a fall. If you wear glasses, make sure to get your prescription updated if your vision changes. Medicine review. Work with your health care provider to regularly review all of the medicines you are taking, including over-the-counter medicines. Ask your health care provider about any side effects that may make you more likely to have a fall. Tell your health care provider if any medicines that you take make you feel dizzy or sleepy. Strength and balance checks. Your health care provider may recommend certain tests to check your strength and balance while standing, walking, or changing positions. Foot health exam. Foot pain and numbness, as well as not wearing proper footwear, can make you more likely to have a fall. Screenings, including: Osteoporosis screening. Osteoporosis is a condition that causes  the bones to get weaker and break more easily. Blood pressure screening. Blood pressure changes and medicines to control blood pressure can make you feel dizzy. Depression screening. You may be more likely to have a fall if you have a fear of falling, feel depressed, or feel unable to do activities that you used to do. Alcohol use screening. Using too much alcohol can affect your balance and may make you more likely to have a fall. Follow these instructions at home: Lifestyle Do not drink alcohol if: Your health care provider tells you not to drink. If you drink alcohol: Limit how much you have to: 0-1 drink a day for women. 0-2 drinks a day for men. Know how much alcohol is in your drink. In the [place] one drink equals one 12 oz bottle of beer (355 mL), one 5 oz glass of wine (148 mL), or one 1 oz glass of hard liquor (44 mL). Do not use any products that contain nicotine or tobacco. These products include cigarettes, chewing tobacco, and vaping devices, such as e-cigarettes. If you need help quitting, ask your health care provider. Activity  Follow a regular exercise program to stay fit. This will help you maintain your balance. Ask your health care provider what types of exercise are appropriate for you. If you need a cane or walker, use it as recommended by your health care provider. Wear supportive shoes that have nonskid soles. Safety  Remove any tripping hazards, such as rugs, cords, and clutter. Install safety equipment such as grab bars in bathrooms and safety rails on stairs. Keep rooms and walkways   well-lit. General instructions Talk with your health care provider about your risks for falling. Tell your health care provider if: You fall. Be sure to tell your health care provider about all falls, even ones that seem minor. You feel dizzy, tiredness (fatigue), or off-balance. Take over-the-counter and prescription medicines only as told by your health care provider. These include  supplements. Eat a healthy diet and maintain a healthy weight. A healthy diet includes low-fat dairy products, low-fat (lean) meats, and fiber from whole grains, beans, and lots of fruits and vegetables. Stay current with your vaccines. Schedule regular health, dental, and eye exams. Summary Having a healthy lifestyle and getting preventive care can help to protect your health and wellness after age 85. Screening and testing are the best way to find a health problem early and help you avoid having a fall. Early diagnosis and treatment give you the best chance for managing medical conditions that are more common for people who are older than age 85. Falls are a major cause of broken bones and head injuries in people who are older than age 85. Take precautions to prevent a fall at home. Work with your health care provider to learn what changes you can make to improve your health and wellness and to prevent falls. This information is not intended to replace advice given to you by your health care provider. Make sure you discuss any questions you have with your health care provider. Document Revised: 11/26/2020 Document Reviewed: 11/26/2020 Elsevier Patient Education  2022 [employer]  

## 2021-07-03 ENCOUNTER — Other Ambulatory Visit: Payer: Self-pay | Admitting: Family

## 2021-07-03 ENCOUNTER — Other Ambulatory Visit: Payer: Self-pay

## 2021-07-03 DIAGNOSIS — E875 Hyperkalemia: Secondary | ICD-10-CM

## 2021-07-03 LAB — CMP14+EGFR
ALT: 4 IU/L (ref 0–32)
AST: 16 IU/L (ref 0–40)
Albumin/Globulin Ratio: 1.8 (ref 1.2–2.2)
Albumin: 4.8 g/dL — ABNORMAL HIGH (ref 3.6–4.6)
Alkaline Phosphatase: 131 IU/L — ABNORMAL HIGH (ref 44–121)
BUN/Creatinine Ratio: 19 (ref 12–28)
BUN: 27 mg/dL (ref 8–27)
Bilirubin Total: 0.5 mg/dL (ref 0.0–1.2)
CO2: 21 mmol/L (ref 20–29)
Calcium: 9.3 mg/dL (ref 8.7–10.3)
Chloride: 105 mmol/L (ref 96–106)
Creatinine, Ser: 1.41 mg/dL — ABNORMAL HIGH (ref 0.57–1.00)
Globulin, Total: 2.7 g/dL (ref 1.5–4.5)
Glucose: 100 mg/dL — ABNORMAL HIGH (ref 70–99)
Potassium: 5.4 mmol/L — ABNORMAL HIGH (ref 3.5–5.2)
Sodium: 142 mmol/L (ref 134–144)
Total Protein: 7.5 g/dL (ref 6.0–8.5)
eGFR: 36 mL/min/{1.73_m2} — ABNORMAL LOW (ref >59)

## 2021-07-03 NOTE — Progress Notes (Signed)
Pt calling back about labs. Please call back. Leave message on machine if she does not answer.

## 2021-07-04 ENCOUNTER — Other Ambulatory Visit: Payer: Medicare Other

## 2021-07-04 DIAGNOSIS — E875 Hyperkalemia: Secondary | ICD-10-CM | POA: Diagnosis not present

## 2021-07-05 LAB — CMP14+EGFR
ALT: 8 IU/L (ref 0–32)
AST: 14 IU/L (ref 0–40)
Albumin/Globulin Ratio: 1.8 (ref 1.2–2.2)
Albumin: 4.9 g/dL — ABNORMAL HIGH (ref 3.6–4.6)
Alkaline Phosphatase: 138 IU/L — ABNORMAL HIGH (ref 44–121)
BUN/Creatinine Ratio: 14 (ref 12–28)
BUN: 20 mg/dL (ref 8–27)
Bilirubin Total: 0.7 mg/dL (ref 0.0–1.2)
CO2: 20 mmol/L (ref 20–29)
Calcium: 9 mg/dL (ref 8.7–10.3)
Chloride: 102 mmol/L (ref 96–106)
Creatinine, Ser: 1.44 mg/dL — ABNORMAL HIGH (ref 0.57–1.00)
Globulin, Total: 2.7 g/dL (ref 1.5–4.5)
Glucose: 99 mg/dL (ref 70–99)
Potassium: 4.4 mmol/L (ref 3.5–5.2)
Sodium: 140 mmol/L (ref 134–144)
Total Protein: 7.6 g/dL (ref 6.0–8.5)
eGFR: 35 mL/min/{1.73_m2} — ABNORMAL LOW (ref >59)

## 2021-07-06 LAB — URINE CULTURE

## 2021-07-08 NOTE — Progress Notes (Signed)
PT R/C about labs

## 2021-09-03 ENCOUNTER — Other Ambulatory Visit: Payer: Self-pay | Admitting: Family

## 2021-09-03 DIAGNOSIS — F1320 Sedative, hypnotic or anxiolytic dependence, uncomplicated: Secondary | ICD-10-CM

## 2021-09-03 DIAGNOSIS — F419 Anxiety disorder, unspecified: Secondary | ICD-10-CM

## 2021-09-03 DIAGNOSIS — Z79899 Other long term (current) drug therapy: Secondary | ICD-10-CM

## 2021-10-01 ENCOUNTER — Encounter: Payer: Self-pay | Admitting: Family

## 2021-10-01 ENCOUNTER — Ambulatory Visit (INDEPENDENT_AMBULATORY_CARE_PROVIDER_SITE_OTHER): Payer: Medicare Other | Admitting: Family

## 2021-10-01 ENCOUNTER — Other Ambulatory Visit: Payer: Self-pay | Admitting: Family

## 2021-10-01 VITALS — BP 181/90 | HR 91 | Temp 97.5°F | Ht 60.0 in | Wt 133.8 lb

## 2021-10-01 DIAGNOSIS — F419 Anxiety disorder, unspecified: Secondary | ICD-10-CM

## 2021-10-01 DIAGNOSIS — J449 Chronic obstructive pulmonary disease, unspecified: Secondary | ICD-10-CM

## 2021-10-01 DIAGNOSIS — K2100 Gastro-esophageal reflux disease with esophagitis, without bleeding: Secondary | ICD-10-CM | POA: Diagnosis not present

## 2021-10-01 DIAGNOSIS — F1320 Sedative, hypnotic or anxiolytic dependence, uncomplicated: Secondary | ICD-10-CM | POA: Diagnosis not present

## 2021-10-01 DIAGNOSIS — I10 Essential (primary) hypertension: Secondary | ICD-10-CM

## 2021-10-01 DIAGNOSIS — Z79899 Other long term (current) drug therapy: Secondary | ICD-10-CM | POA: Diagnosis not present

## 2021-10-01 DIAGNOSIS — F339 Major depressive disorder, recurrent, unspecified: Secondary | ICD-10-CM

## 2021-10-01 MED ORDER — AMLODIPINE BESYLATE 5 MG PO TABS: 5.0000 mg | ORAL_TABLET | Freq: Every day | ORAL | 1 refills | Status: DC

## 2021-10-01 MED ORDER — ALPRAZOLAM 0.5 MG PO TABS: 0.5000 mg | ORAL_TABLET | Freq: Two times a day (BID) | ORAL | 2 refills | Status: DC | PRN

## 2021-10-01 NOTE — Patient Instructions (Signed)
Health Maintenance After Age 86 After age 86, you are at a higher risk for certain long-term diseases and infections as well as injuries from falls. Falls are a major cause of broken bones and head injuries in people who are older than age 86. Getting regular preventive care can help to keep you healthy and well. Preventive care includes getting regular testing and making lifestyle changes as recommended by your health care provider. Talk with your health care provider about: Which screenings and tests you should have. A screening is a test that checks for a disease when you have no symptoms. A diet and exercise plan that is right for you. What should I know about screenings and tests to prevent falls? Screening and testing are the best ways to find a health problem early. Early diagnosis and treatment give you the best chance of managing medical conditions that are common after age 86. Certain conditions and lifestyle choices may make you more likely to have a fall. Your health care provider may recommend: Regular vision checks. Poor vision and conditions such as cataracts can make you more likely to have a fall. If you wear glasses, make sure to get your prescription updated if your vision changes. Medicine review. Work with your health care provider to regularly review all of the medicines you are taking, including over-the-counter medicines. Ask your health care provider about any side effects that may make you more likely to have a fall. Tell your health care provider if any medicines that you take make you feel dizzy or sleepy. Strength and balance checks. Your health care provider may recommend certain tests to check your strength and balance while standing, walking, or changing positions. Foot health exam. Foot pain and numbness, as well as not wearing proper footwear, can make you more likely to have a fall. Screenings, including: Osteoporosis screening. Osteoporosis is a condition that causes  the bones to get weaker and break more easily. Blood pressure screening. Blood pressure changes and medicines to control blood pressure can make you feel dizzy. Depression screening. You may be more likely to have a fall if you have a fear of falling, feel depressed, or feel unable to do activities that you used to do. Alcohol use screening. Using too much alcohol can affect your balance and may make you more likely to have a fall. Follow these instructions at home: Lifestyle Do not drink alcohol if: Your health care provider tells you not to drink. If you drink alcohol: Limit how much you have to: 0-1 drink a day for women. 0-2 drinks a day for men. Know how much alcohol is in your drink. In the [place] one drink equals one 12 oz bottle of beer (355 mL), one 5 oz glass of wine (148 mL), or one 1 oz glass of hard liquor (44 mL). Do not use any products that contain nicotine or tobacco. These products include cigarettes, chewing tobacco, and vaping devices, such as e-cigarettes. If you need help quitting, ask your health care provider. Activity  Follow a regular exercise program to stay fit. This will help you maintain your balance. Ask your health care provider what types of exercise are appropriate for you. If you need a cane or walker, use it as recommended by your health care provider. Wear supportive shoes that have nonskid soles. Safety  Remove any tripping hazards, such as rugs, cords, and clutter. Install safety equipment such as grab bars in bathrooms and safety rails on stairs. Keep rooms and walkways   well-lit. General instructions Talk with your health care provider about your risks for falling. Tell your health care provider if: You fall. Be sure to tell your health care provider about all falls, even ones that seem minor. You feel dizzy, tiredness (fatigue), or off-balance. Take over-the-counter and prescription medicines only as told by your health care provider. These include  supplements. Eat a healthy diet and maintain a healthy weight. A healthy diet includes low-fat dairy products, low-fat (lean) meats, and fiber from whole grains, beans, and lots of fruits and vegetables. Stay current with your vaccines. Schedule regular health, dental, and eye exams. Summary Having a healthy lifestyle and getting preventive care can help to protect your health and wellness after age 86. Screening and testing are the best way to find a health problem early and help you avoid having a fall. Early diagnosis and treatment give you the best chance for managing medical conditions that are more common for people who are older than age 86. Falls are a major cause of broken bones and head injuries in people who are older than age 86. Take precautions to prevent a fall at home. Work with your health care provider to learn what changes you can make to improve your health and wellness and to prevent falls. This information is not intended to replace advice given to you by your health care provider. Make sure you discuss any questions you have with your health care provider. Document Revised: 11/26/2020 Document Reviewed: 11/26/2020 Elsevier Patient Education  2022 Elsevier Inc.  

## 2021-10-01 NOTE — Progress Notes (Signed)
? ?Subjective:  ? ? Patient ID: Kelly Peters, female    DOB: 09-05-1929, 88 y.o.   MRN: [id number] ? ?Chief Complaint  ?Patient presents with  ? Medical Management of Chronic Issues  ? ?PT presents to the office today for chronic follow up. She has COPD and states her breathing is "good". She has an albuterol, but doesn't have to use it. Quit smoking 07/2018. ?Hypertension ?This is a chronic problem. The current episode started more than 1 year ago. The problem has been waxing and waning since onset. The problem is uncontrolled. Associated symptoms include anxiety and malaise/fatigue. Pertinent negatives include no peripheral edema or shortness of breath. Risk factors for coronary artery disease include sedentary lifestyle. The current treatment provides moderate improvement.  ?Gastroesophageal Reflux ?She complains of belching and heartburn. This is a chronic problem. The current episode started more than 1 year ago. The problem occurs occasionally. She has tried a PPI for the symptoms. The treatment provided moderate relief.  ?Depression ?       This is a chronic problem.  The current episode started more than 1 year ago.   The onset quality is gradual.   The problem occurs intermittently.  Associated symptoms include no helplessness, no hopelessness, not irritable and not sad.  Past treatments include SSRIs - Selective serotonin reuptake inhibitors.  Past medical history includes anxiety.   ?Anxiety ?Presents for follow-up visit. Symptoms include excessive worry, irritability and nervous/anxious behavior. Patient reports no shortness of breath. Symptoms occur occasionally. The severity of symptoms is moderate.  ? ? ?Constipation ?This is a chronic problem. The current episode started more than 1 year ago. The problem has been waxing and waning since onset. She has tried laxatives for the symptoms. The treatment provided moderate relief.  ? ?Current opioids rx- Xanax 0.5 mg ?# meds rx- 15 ?Effectiveness of  current meds-stable ?Adverse reactions from pain meds-None ? ?Pill count performed-No ?Last drug screen - 10/24/20 ?( high risk q20m, moderate risk q73m, low risk yearly ) ?Urine drug screen today- No ?Was the [place] reviewed- yes ? If yes were their any concerning findings? - none ? ? ?Pain contract signed on: 07/04/21 ? ? ?Review of Systems  ?Constitutional:  Positive for irritability and malaise/fatigue.  ?Respiratory:  Negative for shortness of breath.   ?Gastrointestinal:  Positive for constipation and heartburn.  ?Psychiatric/Behavioral:  Positive for depression. The patient is nervous/anxious.   ?All other systems reviewed and are negative. ? ?   ?Objective:  ? Physical Exam ?Vitals reviewed.  ?Constitutional:   ?   General: She is not irritable.She is not in acute distress. ?   Appearance: She is well-developed.  ?HENT:  ?   Head: Normocephalic and atraumatic.  ?   Right Ear: Tympanic membrane normal.  ?   Left Ear: Tympanic membrane normal.  ?Eyes:  ?   Pupils: Pupils are equal, round, and reactive to light.  ?Neck:  ?   Thyroid: No thyromegaly.  ?Cardiovascular:  ?   Rate and Rhythm: Normal rate and regular rhythm.  ?   Heart sounds: Normal heart sounds. No murmur heard. ?Pulmonary:  ?   Effort: Pulmonary effort is normal. No respiratory distress.  ?   Breath sounds: Normal breath sounds. No wheezing.  ?Abdominal:  ?   General: Bowel sounds are normal. There is no distension.  ?   Palpations: Abdomen is soft.  ?   Tenderness: There is no abdominal tenderness.  ?Musculoskeletal:     ?  General: No tenderness. Normal range of motion.  ?   Cervical back: Normal range of motion and neck supple.  ?Skin: ?   General: Skin is warm and dry.  ?Neurological:  ?   Mental Status: She is alert and oriented to person, place, and time.  ?   Cranial Nerves: No cranial nerve deficit.  ?   Deep Tendon Reflexes: Reflexes are normal and symmetric.  ?Psychiatric:     ?   Behavior: Behavior normal.     ?   Thought Content: Thought  content normal.     ?   Judgment: Judgment normal.  ? ? ? ? ?BP (!) 174/71   Pulse 91   Temp (!) 97.5 ?F (36.4 ?C) (Temporal)   Ht 5' (1.524 m)   Wt 133 lb 12.8 oz (60.7 kg)   SpO2 91%   BMI 26.13 kg/m?  ? ?   ?Assessment & Plan:  ?[name] comes in today with chief complaint of Medical Management of Chronic Issues ? ? ?Diagnosis and orders addressed: ? ?1. Anxiety ? ?- ALPRAZolam (XANAX) 0.5 MG tablet; Take 1 tablet (0.5 mg total) by mouth 2 (two) times daily as needed for anxiety.  Dispense: 45 tablet; Refill: 2 ? ?2. Benzodiazepine dependence ([place]) ?- ALPRAZolam (XANAX) 0.5 MG tablet; Take 1 tablet (0.5 mg total) by mouth 2 (two) times daily as needed for anxiety.  Dispense: 45 tablet; Refill: 2 ? ?3. Controlled substance agreement signed ?- ALPRAZolam (XANAX) 0.5 MG tablet; Take 1 tablet (0.5 mg total) by mouth 2 (two) times daily as needed for anxiety.  Dispense: 45 tablet; Refill: 2 ? ?4. Essential hypertension ?Restart Norvasc 5 mg  ?-Daily blood pressure log given with instructions on how to fill out and told to bring to next visit ?-Dash diet information given ?-Exercise encouraged ?- Stress Management  ?-Continue current meds ?-RTO in 3 months  ?- amLODipine (NORVASC) 5 MG tablet; Take 1 tablet (5 mg total) by mouth daily.  Dispense: 90 tablet; Refill: 1 ? ?5. Moderate COPD (chronic obstructive pulmonary disease) ([place]) ? ?6. Gastroesophageal reflux disease with esophagitis, unspecified whether hemorrhage ? ?7. Depression, recurrent ([place]) ? ? ?Patient reviewed in [place] controlled database, no flags noted. Contract and drug screen are up to date. ?Health Maintenance reviewed ?Diet and exercise encouraged ? ?Follow up plan: ?3 months  ? ?[name], FNP ? ? ?

## 2021-12-02 ENCOUNTER — Other Ambulatory Visit: Payer: Self-pay | Admitting: Family

## 2021-12-02 DIAGNOSIS — F419 Anxiety disorder, unspecified: Secondary | ICD-10-CM

## 2021-12-02 DIAGNOSIS — Z79899 Other long term (current) drug therapy: Secondary | ICD-10-CM

## 2021-12-02 DIAGNOSIS — F1320 Sedative, hypnotic or anxiolytic dependence, uncomplicated: Secondary | ICD-10-CM

## 2021-12-25 IMAGING — DX DG ABDOMEN 1V
2 series · 2 of 2 positions shown · non-contrast
Comparison: CT 06/23/2017

CLINICAL DATA: Left lower quadrant pain

EXAM:
ABDOMEN - 1 VIEW

[abdomen kub (1 of 2)]
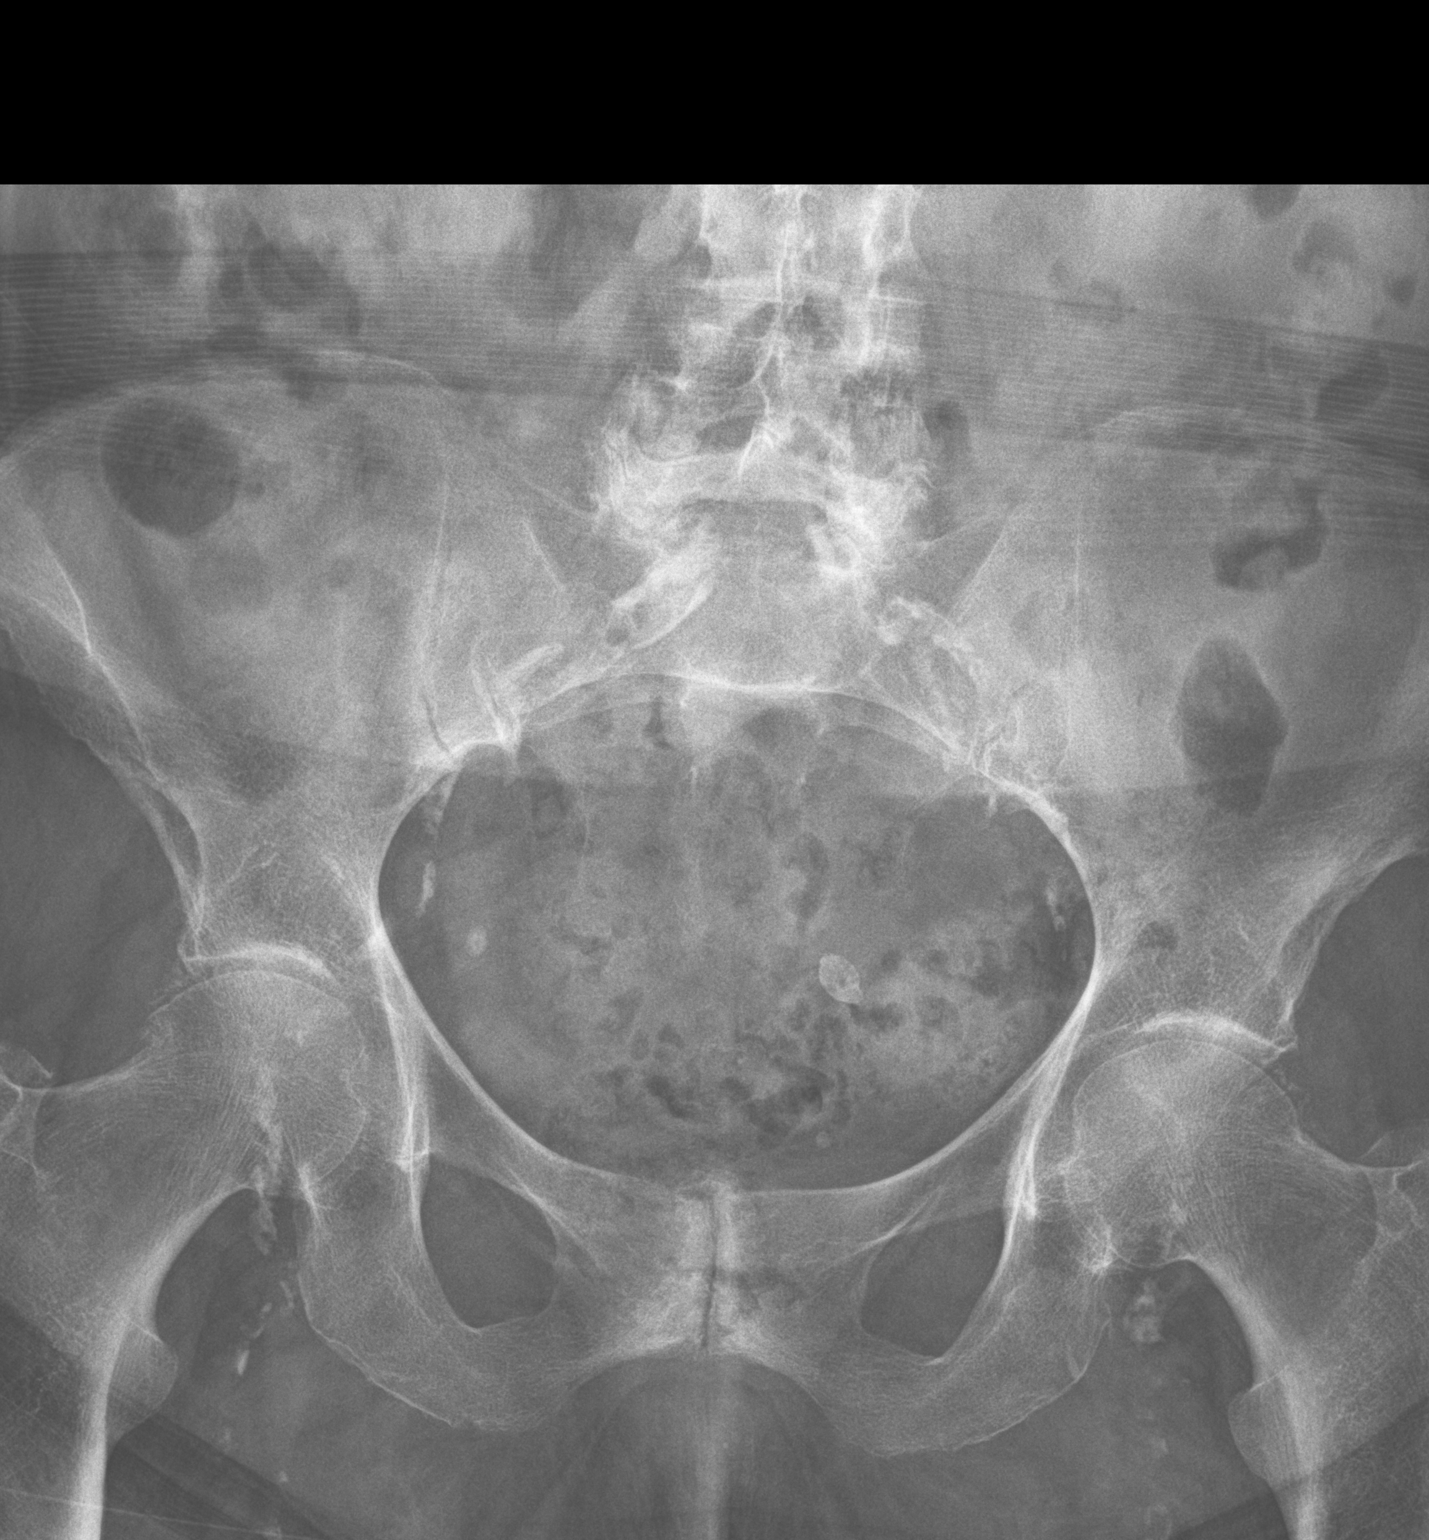

[abdomen kub (2 of 2)]
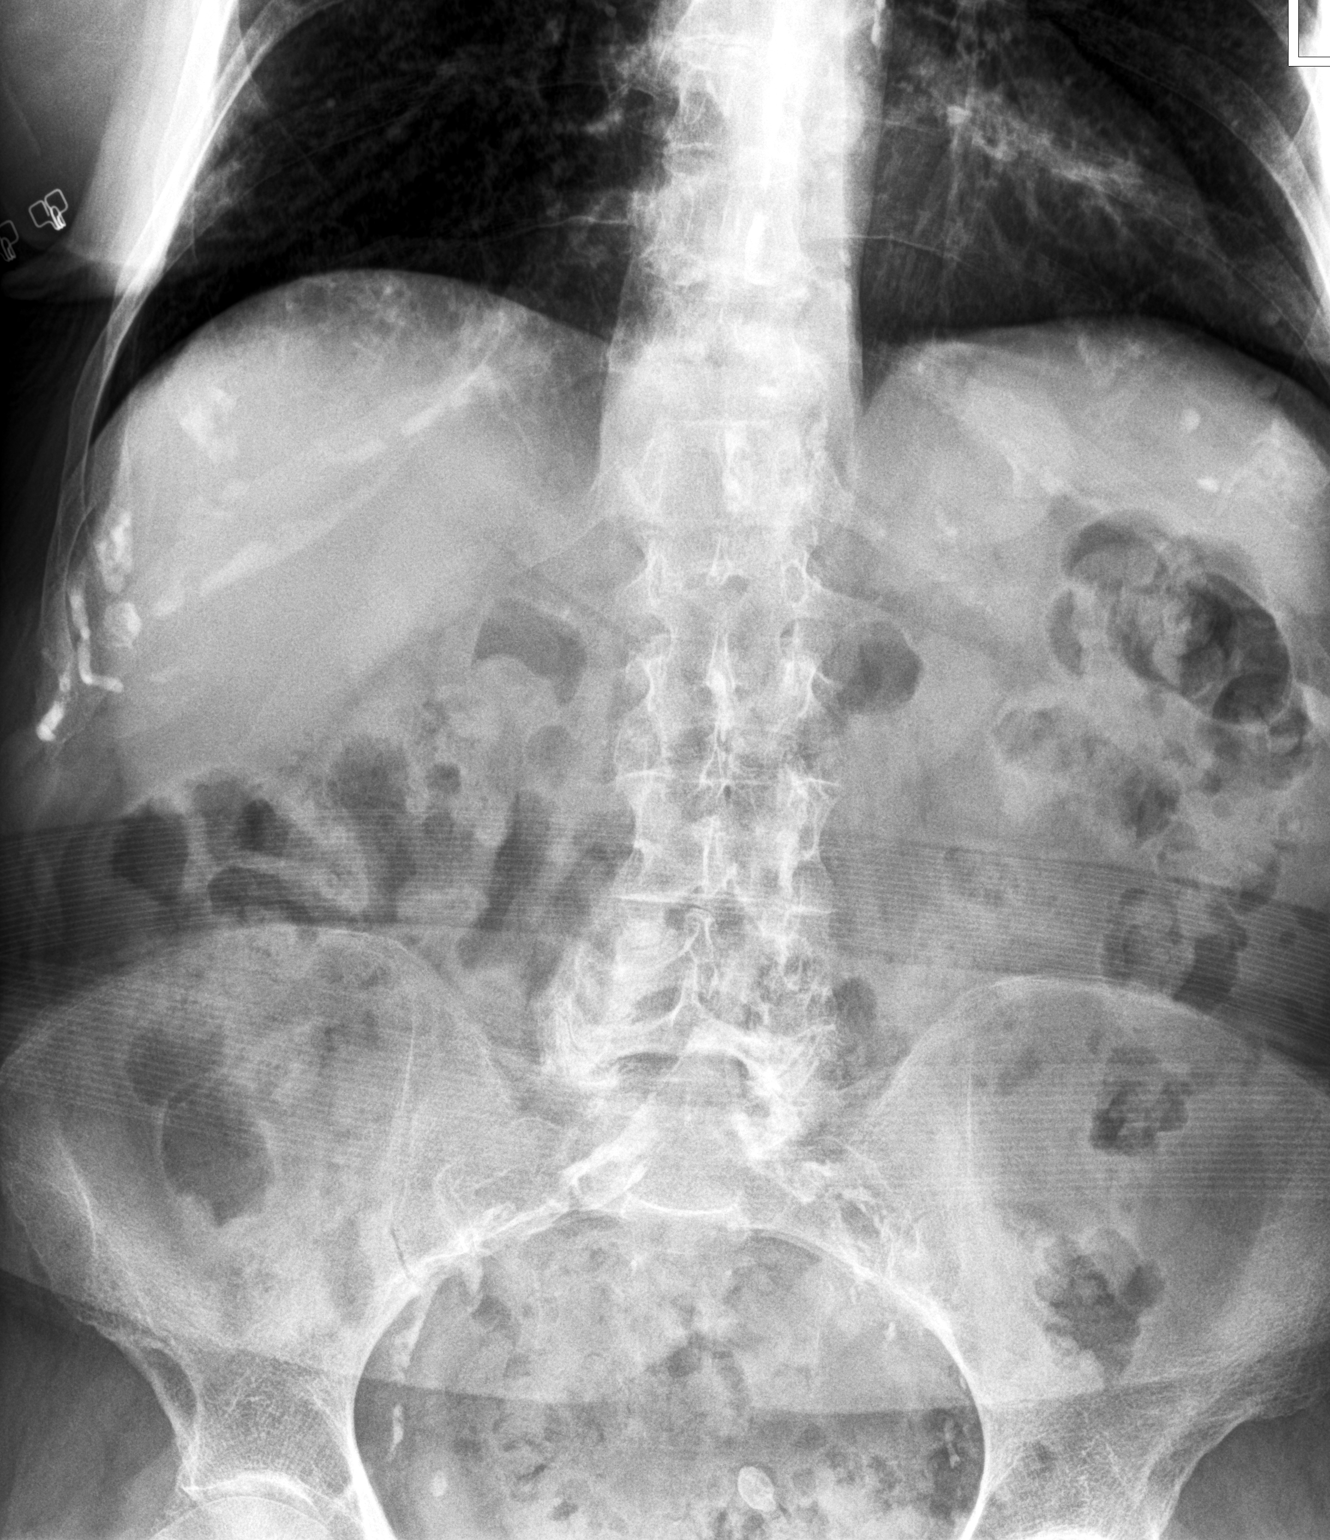

[2 of 2 positions shown; findings below may reference images not displayed]

FINDINGS: Lung bases are clear. Nonobstructed gas pattern with moderate stool.
No radiopaque calculi. Vascular calcification
IMPRESSION: Negative.

## 2021-12-31 ENCOUNTER — Other Ambulatory Visit: Payer: Self-pay | Admitting: Family

## 2021-12-31 ENCOUNTER — Ambulatory Visit (INDEPENDENT_AMBULATORY_CARE_PROVIDER_SITE_OTHER): Payer: Medicare Other | Admitting: Family

## 2021-12-31 ENCOUNTER — Encounter: Payer: Self-pay | Admitting: Family

## 2021-12-31 VITALS — BP 136/77 | HR 79 | Temp 97.2°F | Ht 61.0 in | Wt 136.2 lb

## 2021-12-31 DIAGNOSIS — F1320 Sedative, hypnotic or anxiolytic dependence, uncomplicated: Secondary | ICD-10-CM | POA: Diagnosis not present

## 2021-12-31 DIAGNOSIS — I10 Essential (primary) hypertension: Secondary | ICD-10-CM

## 2021-12-31 DIAGNOSIS — Z87891 Personal history of nicotine dependence: Secondary | ICD-10-CM | POA: Diagnosis not present

## 2021-12-31 DIAGNOSIS — F419 Anxiety disorder, unspecified: Secondary | ICD-10-CM

## 2021-12-31 DIAGNOSIS — Z79899 Other long term (current) drug therapy: Secondary | ICD-10-CM

## 2021-12-31 DIAGNOSIS — K5900 Constipation, unspecified: Secondary | ICD-10-CM

## 2021-12-31 DIAGNOSIS — K2100 Gastro-esophageal reflux disease with esophagitis, without bleeding: Secondary | ICD-10-CM

## 2021-12-31 DIAGNOSIS — J449 Chronic obstructive pulmonary disease, unspecified: Secondary | ICD-10-CM

## 2021-12-31 DIAGNOSIS — F339 Major depressive disorder, recurrent, unspecified: Secondary | ICD-10-CM

## 2021-12-31 MED ORDER — ALPRAZOLAM 0.5 MG PO TABS: 0.5000 mg | ORAL_TABLET | Freq: Two times a day (BID) | ORAL | 2 refills | Status: DC | PRN

## 2021-12-31 NOTE — Patient Instructions (Signed)
Health Maintenance After Age 86 After age 86, you are at a higher risk for certain long-term diseases and infections as well as injuries from falls. Falls are a major cause of broken bones and head injuries in people who are older than age 86. Getting regular preventive care can help to keep you healthy and well. Preventive care includes getting regular testing and making lifestyle changes as recommended by your health care provider. Talk with your health care provider about: Which screenings and tests you should have. A screening is a test that checks for a disease when you have no symptoms. A diet and exercise plan that is right for you. What should I know about screenings and tests to prevent falls? Screening and testing are the best ways to find a health problem early. Early diagnosis and treatment give you the best chance of managing medical conditions that are common after age 86. Certain conditions and lifestyle choices may make you more likely to have a fall. Your health care provider may recommend: Regular vision checks. Poor vision and conditions such as cataracts can make you more likely to have a fall. If you wear glasses, make sure to get your prescription updated if your vision changes. Medicine review. Work with your health care provider to regularly review all of the medicines you are taking, including over-the-counter medicines. Ask your health care provider about any side effects that may make you more likely to have a fall. Tell your health care provider if any medicines that you take make you feel dizzy or sleepy. Strength and balance checks. Your health care provider may recommend certain tests to check your strength and balance while standing, walking, or changing positions. Foot health exam. Foot pain and numbness, as well as not wearing proper footwear, can make you more likely to have a fall. Screenings, including: Osteoporosis screening. Osteoporosis is a condition that causes  the bones to get weaker and break more easily. Blood pressure screening. Blood pressure changes and medicines to control blood pressure can make you feel dizzy. Depression screening. You may be more likely to have a fall if you have a fear of falling, feel depressed, or feel unable to do activities that you used to do. Alcohol use screening. Using too much alcohol can affect your balance and may make you more likely to have a fall. Follow these instructions at home: Lifestyle Do not drink alcohol if: Your health care provider tells you not to drink. If you drink alcohol: Limit how much you have to: 0-1 drink a day for women. 0-2 drinks a day for men. Know how much alcohol is in your drink. In the [place] one drink equals one 12 oz bottle of beer (355 mL), one 5 oz glass of wine (148 mL), or one 1 oz glass of hard liquor (44 mL). Do not use any products that contain nicotine or tobacco. These products include cigarettes, chewing tobacco, and vaping devices, such as e-cigarettes. If you need help quitting, ask your health care provider. Activity  Follow a regular exercise program to stay fit. This will help you maintain your balance. Ask your health care provider what types of exercise are appropriate for you. If you need a cane or walker, use it as recommended by your health care provider. Wear supportive shoes that have nonskid soles. Safety  Remove any tripping hazards, such as rugs, cords, and clutter. Install safety equipment such as grab bars in bathrooms and safety rails on stairs. Keep rooms and walkways   well-lit. General instructions Talk with your health care provider about your risks for falling. Tell your health care provider if: You fall. Be sure to tell your health care provider about all falls, even ones that seem minor. You feel dizzy, tiredness (fatigue), or off-balance. Take over-the-counter and prescription medicines only as told by your health care provider. These include  supplements. Eat a healthy diet and maintain a healthy weight. A healthy diet includes low-fat dairy products, low-fat (lean) meats, and fiber from whole grains, beans, and lots of fruits and vegetables. Stay current with your vaccines. Schedule regular health, dental, and eye exams. Summary Having a healthy lifestyle and getting preventive care can help to protect your health and wellness after age 86. Screening and testing are the best way to find a health problem early and help you avoid having a fall. Early diagnosis and treatment give you the best chance for managing medical conditions that are more common for people who are older than age 86. Falls are a major cause of broken bones and head injuries in people who are older than age 86. Take precautions to prevent a fall at home. Work with your health care provider to learn what changes you can make to improve your health and wellness and to prevent falls. This information is not intended to replace advice given to you by your health care provider. Make sure you discuss any questions you have with your health care provider. Document Revised: 11/26/2020 Document Reviewed: 11/26/2020 Elsevier Patient Education  2023 [employer].  

## 2021-12-31 NOTE — Progress Notes (Signed)
Subjective:    Patient ID: Kelly Peters, female    DOB: 06/19/1930, 88 y.o.   MRN: [id number]  Chief Complaint  Patient presents with   Medical Management of Chronic Issues   PT presents to the office today for chronic follow up. She has COPD and states her breathing is "good". She has an albuterol, but doesn't have to use it. Quit smoking 07/2018. Hypertension This is a chronic problem. The current episode started more than 1 year ago. The problem has been resolved since onset. The problem is controlled. Associated symptoms include anxiety and malaise/fatigue. Pertinent negatives include no peripheral edema or shortness of breath. Risk factors for coronary artery disease include dyslipidemia and sedentary lifestyle. The current treatment provides moderate improvement.  Gastroesophageal Reflux She complains of belching and heartburn. This is a chronic problem. The current episode started more than 1 year ago. The problem occurs occasionally. She has tried a PPI for the symptoms. The treatment provided moderate relief.  Depression        This is a chronic problem.  The current episode started more than 1 year ago.   Associated symptoms include no helplessness, no hopelessness, no restlessness and not sad.  Past treatments include SSRIs - Selective serotonin reuptake inhibitors.  Past medical history includes anxiety.   Anxiety Presents for follow-up visit. Patient reports no excessive worry, irritability, nervous/anxious behavior, restlessness or shortness of breath. Symptoms occur occasionally.    Constipation This is a chronic problem. The current episode started more than 1 year ago. The problem has been waxing and waning since onset. Her stool frequency is 1 time per day.      Review of Systems  Constitutional:  Positive for malaise/fatigue. Negative for irritability.  Respiratory:  Negative for shortness of breath.   Gastrointestinal:  Positive for constipation and heartburn.   Psychiatric/Behavioral:  Positive for depression. The patient is not nervous/anxious.   All other systems reviewed and are negative.      Objective:   Physical Exam Vitals reviewed.  Constitutional:      General: She is not in acute distress.    Appearance: She is well-developed.  HENT:     Head: Normocephalic and atraumatic.     Right Ear: Tympanic membrane normal.     Left Ear: Tympanic membrane normal.  Eyes:     Pupils: Pupils are equal, round, and reactive to light.  Neck:     Thyroid: No thyromegaly.  Cardiovascular:     Rate and Rhythm: Normal rate and regular rhythm.     Heart sounds: Normal heart sounds. No murmur heard. Pulmonary:     Effort: Pulmonary effort is normal. No respiratory distress.     Breath sounds: Normal breath sounds. No wheezing.  Abdominal:     General: Bowel sounds are normal. There is no distension.     Palpations: Abdomen is soft.     Tenderness: There is no abdominal tenderness.  Musculoskeletal:        General: No tenderness. Normal range of motion.     Cervical back: Normal range of motion and neck supple.  Skin:    General: Skin is warm and dry.  Neurological:     Mental Status: She is alert and oriented to person, place, and time.     Cranial Nerves: No cranial nerve deficit.     Deep Tendon Reflexes: Reflexes are normal and symmetric.  Psychiatric:        Behavior: Behavior normal.  Thought Content: Thought content normal.        Judgment: Judgment normal.       BP 136/77   Pulse 79   Temp (!) 97.2 F (36.2 C) (Temporal)   Ht _0  (1.549 m)   Wt 136 lb 3.2 oz (61.8 kg)   SpO2 92%   BMI 25.73 kg/m      Assessment & Plan:  Kelly Peters comes in today with chief complaint of Medical Management of Chronic Issues   Diagnosis and orders addressed:  1. Anxiety - ToxASSURE Select 13 (MW), Urine - ALPRAZolam (XANAX) 0.5 MG tablet; Take 1 tablet (0.5 mg total) by mouth 2 (two) times daily as needed for anxiety.   Dispense: 45 tablet; Refill: 2 - CMP14+EGFR - CBC with Differential/Platelet  2. Benzodiazepine dependence (HCC) - ToxASSURE Select 13 (MW), Urine - ALPRAZolam (XANAX) 0.5 MG tablet; Take 1 tablet (0.5 mg total) by mouth 2 (two) times daily as needed for anxiety.  Dispense: 45 tablet; Refill: 2 - CMP14+EGFR - CBC with Differential/Platelet  3. Controlled substance agreement signed - ToxASSURE Select 13 (MW), Urine - ALPRAZolam (XANAX) 0.5 MG tablet; Take 1 tablet (0.5 mg total) by mouth 2 (two) times daily as needed for anxiety.  Dispense: 45 tablet; Refill: 2 - CMP14+EGFR - CBC with Differential/Platelet  4. Essential hypertension - CMP14+EGFR - CBC with Differential/Platelet  5. Moderate COPD (chronic obstructive pulmonary disease) (HCC) - CMP14+EGFR - CBC with Differential/Platelet  6. Gastroesophageal reflux disease with esophagitis, unspecified whether hemorrhage - CMP14+EGFR - CBC with Differential/Platelet  7. Constipation, unspecified constipation type - CMP14+EGFR - CBC with Differential/Platelet  8. Depression, recurrent ([place]) - CMP14+EGFR - CBC with Differential/Platelet  9. History of tobacco abuse - CMP14+EGFR - CBC with Differential/Platelet   Labs pending Patient reviewed in [place] controlled database, no flags noted. Contract and drug screen up dated today.  Health Maintenance reviewed Diet and exercise encouraged  Follow up plan: 3 months   [name], FNP

## 2022-01-01 LAB — CBC WITH DIFFERENTIAL/PLATELET
Basophils Absolute: 0.1 10*3/uL (ref 0.0–0.2)
Basos: 2 %
EOS (ABSOLUTE): 0.2 10*3/uL (ref 0.0–0.4)
Eos: 3 %
Hematocrit: 35.2 % (ref 34.0–46.6)
Hemoglobin: 11.3 g/dL (ref 11.1–15.9)
Immature Grans (Abs): 0 10*3/uL (ref 0.0–0.1)
Immature Granulocytes: 0 %
Lymphocytes Absolute: 1.4 10*3/uL (ref 0.7–3.1)
Lymphs: 23 %
MCH: 29.6 pg (ref 26.6–33.0)
MCHC: 32.1 g/dL (ref 31.5–35.7)
MCV: 92 fL (ref 79–97)
Monocytes Absolute: 0.6 10*3/uL (ref 0.1–0.9)
Monocytes: 10 %
Neutrophils Absolute: 3.7 10*3/uL (ref 1.4–7.0)
Neutrophils: 62 %
Platelets: 202 10*3/uL (ref 150–450)
RBC: 3.82 x10E6/uL (ref 3.77–5.28)
RDW: 12.1 % (ref 11.7–15.4)
WBC: 5.9 10*3/uL (ref 3.4–10.8)

## 2022-01-01 LAB — CMP14+EGFR
ALT: 6 IU/L (ref 0–32)
AST: 13 IU/L (ref 0–40)
Albumin/Globulin Ratio: 1.6 (ref 1.2–2.2)
Albumin: 4.6 g/dL (ref 3.6–4.6)
Alkaline Phosphatase: 151 IU/L — ABNORMAL HIGH (ref 44–121)
BUN/Creatinine Ratio: 13 (ref 12–28)
BUN: 19 mg/dL (ref 8–27)
Bilirubin Total: 0.6 mg/dL (ref 0.0–1.2)
CO2: 22 mmol/L (ref 20–29)
Calcium: 9.2 mg/dL (ref 8.7–10.3)
Chloride: 103 mmol/L (ref 96–106)
Creatinine, Ser: 1.47 mg/dL — ABNORMAL HIGH (ref 0.57–1.00)
Globulin, Total: 2.8 g/dL (ref 1.5–4.5)
Glucose: 100 mg/dL — ABNORMAL HIGH (ref 70–99)
Potassium: 4.5 mmol/L (ref 3.5–5.2)
Sodium: 141 mmol/L (ref 134–144)
Total Protein: 7.4 g/dL (ref 6.0–8.5)
eGFR: 34 mL/min/{1.73_m2} — ABNORMAL LOW (ref >59)

## 2022-01-02 ENCOUNTER — Other Ambulatory Visit: Payer: Self-pay | Admitting: Family

## 2022-01-02 DIAGNOSIS — I10 Essential (primary) hypertension: Secondary | ICD-10-CM

## 2022-01-03 ENCOUNTER — Telehealth: Payer: Self-pay | Admitting: Family

## 2022-01-03 NOTE — Telephone Encounter (Signed)
Alphonzo Dublin, LPN  12/20/930 35:57 PM EDT Back to Top    Pt has been informed. States that she does not take any medications other than what Alyse Low prescribes. Advised pt to increase her water intake and we can re check at her next visit. Pt has no concerns.

## 2022-01-03 NOTE — Telephone Encounter (Signed)
Patient said she was half asleep when she spoke with a nurse about her lab results and would like for someone to call her and explain the meaning of the results. Please call back.

## 2022-01-05 LAB — TOXASSURE SELECT 13 (MW), URINE

## 2022-02-26 ENCOUNTER — Ambulatory Visit (INDEPENDENT_AMBULATORY_CARE_PROVIDER_SITE_OTHER): Payer: Medicare Other

## 2022-02-26 VITALS — BP 127/79 | HR 94 | Wt 130.0 lb

## 2022-02-26 DIAGNOSIS — Z0000 Encounter for general adult medical examination without abnormal findings: Secondary | ICD-10-CM

## 2022-02-26 NOTE — Patient Instructions (Signed)
Kelly Peters , Thank you for taking time to come for your Medicare Wellness Visit. I appreciate your ongoing commitment to your health goals. Please review the following plan we discussed and let me know if I can assist you in the future.   Screening recommendations/referrals: Colonoscopy: no longer required Mammogram: no longer required Bone Density: no longer required Recommended yearly ophthalmology/optometry visit for glaucoma screening and checkup Recommended yearly dental visit for hygiene and checkup  Vaccinations: declines all Influenza vaccine: recommend every Fall Pneumococcal vaccine: recommend once per lifetime Prevnar-20 Tdap vaccine: Done 07/01/2020 - recommend every 10 years Shingles vaccine: recommend Shingrix which is 2 doses 2-6 months apart and over 90% effective     Covid-19: recommend 2 doses one month apart with a booster 6 months later   Advanced directives: Please bring a copy of your health care power of attorney and living will to the office to be added to your chart at your convenience.   Conditions/risks identified: Aim for 30 minutes of exercise or brisk walking, 6-8 glasses of water, and 5 servings of fruits and vegetables each day.   Next appointment: Follow up in one year for your annual wellness visit    Preventive Care 65 Years and Older, Female Preventive care refers to lifestyle choices and visits with your health care provider that can promote health and wellness. What does preventive care include? A yearly physical exam. This is also called an annual well check. Dental exams once or twice a year. Routine eye exams. Ask your health care provider how often you should have your eyes checked. Personal lifestyle choices, including: Daily care of your teeth and gums. Regular physical activity. Eating a healthy diet. Avoiding tobacco and drug use. Limiting alcohol use. Practicing safe sex. Taking low-dose aspirin every day. Taking vitamin and  mineral supplements as recommended by your health care provider. What happens during an annual well check? The services and screenings done by your health care provider during your annual well check will depend on your age, overall health, lifestyle risk factors, and family history of disease. Counseling  Your health care provider may ask you questions about your: Alcohol use. Tobacco use. Drug use. Emotional well-being. Home and relationship well-being. Sexual activity. Eating habits. History of falls. Memory and ability to understand (cognition). Work and work Statistician. Reproductive health. Screening  You may have the following tests or measurements: Height, weight, and BMI. Blood pressure. Lipid and cholesterol levels. These may be checked every 5 years, or more frequently if you are over 60 years old. Skin check. Lung cancer screening. You may have this screening every year starting at age 84 if you have a 30-pack-year history of smoking and currently smoke or have quit within the past 15 years. Fecal occult blood test (FOBT) of the stool. You may have this test every year starting at age 76. Flexible sigmoidoscopy or colonoscopy. You may have a sigmoidoscopy every 5 years or a colonoscopy every 10 years starting at age 53. Hepatitis C blood test. Hepatitis B blood test. Sexually transmitted disease (STD) testing. Diabetes screening. This is done by checking your blood sugar (glucose) after you have not eaten for a while (fasting). You may have this done every 1-3 years. Bone density scan. This is done to screen for osteoporosis. You may have this done starting at age 6. Mammogram. This may be done every 1-2 years. Talk to your health care provider about how often you should have regular mammograms. Talk with your health care  provider about your test results, treatment options, and if necessary, the need for more tests. Vaccines  Your health care provider may recommend  certain vaccines, such as: Influenza vaccine. This is recommended every year. Tetanus, diphtheria, and acellular pertussis (Tdap, Td) vaccine. You may need a Td booster every 10 years. Zoster vaccine. You may need this after age 35. Pneumococcal 13-valent conjugate (PCV13) vaccine. One dose is recommended after age 55. Pneumococcal polysaccharide (PPSV23) vaccine. One dose is recommended after age 61. Talk to your health care provider about which screenings and vaccines you need and how often you need them. This information is not intended to replace advice given to you by your health care provider. Make sure you discuss any questions you have with your health care provider. Document Released: 08/03/2015 Document Revised: 03/26/2016 Document Reviewed: 05/08/2015 Elsevier Interactive Patient Education  2017 [place] Prevention in the Home Falls can cause injuries. They can happen to people of all ages. There are many things you can do to make your home safe and to help prevent falls. What can I do on the outside of my home? Regularly fix the edges of walkways and driveways and fix any cracks. Remove anything that might make you trip as you walk through a door, such as a raised step or threshold. Trim any bushes or trees on the path to your home. Use bright outdoor lighting. Clear any walking paths of anything that might make someone trip, such as rocks or tools. Regularly check to see if handrails are loose or broken. Make sure that both sides of any steps have handrails. Any raised decks and porches should have guardrails on the edges. Have any leaves, snow, or ice cleared regularly. Use sand or salt on walking paths during winter. Clean up any spills in your garage right away. This includes oil or grease spills. What can I do in the bathroom? Use night lights. Install grab bars by the toilet and in the tub and shower. Do not use towel bars as grab bars. Use non-skid mats or  decals in the tub or shower. If you need to sit down in the shower, use a plastic, non-slip stool. Keep the floor dry. Clean up any water that spills on the floor as soon as it happens. Remove soap buildup in the tub or shower regularly. Attach bath mats securely with double-sided non-slip rug tape. Do not have throw rugs and other things on the floor that can make you trip. What can I do in the bedroom? Use night lights. Make sure that you have a light by your bed that is easy to reach. Do not use any sheets or blankets that are too big for your bed. They should not hang down onto the floor. Have a firm chair that has side arms. You can use this for support while you get dressed. Do not have throw rugs and other things on the floor that can make you trip. What can I do in the kitchen? Clean up any spills right away. Avoid walking on wet floors. Keep items that you use a lot in easy-to-reach places. If you need to reach something above you, use a strong step stool that has a grab bar. Keep electrical cords out of the way. Do not use floor polish or wax that makes floors slippery. If you must use wax, use non-skid floor wax. Do not have throw rugs and other things on the floor that can make you trip. What can I  do with my stairs? Do not leave any items on the stairs. Make sure that there are handrails on both sides of the stairs and use them. Fix handrails that are broken or loose. Make sure that handrails are as long as the stairways. Check any carpeting to make sure that it is firmly attached to the stairs. Fix any carpet that is loose or worn. Avoid having throw rugs at the top or bottom of the stairs. If you do have throw rugs, attach them to the floor with carpet tape. Make sure that you have a light switch at the top of the stairs and the bottom of the stairs. If you do not have them, ask someone to add them for you. What else can I do to help prevent falls? Wear shoes that: Do not  have high heels. Have rubber bottoms. Are comfortable and fit you well. Are closed at the toe. Do not wear sandals. If you use a stepladder: Make sure that it is fully opened. Do not climb a closed stepladder. Make sure that both sides of the stepladder are locked into place. Ask someone to hold it for you, if possible. Clearly mark and make sure that you can see: Any grab bars or handrails. First and last steps. Where the edge of each step is. Use tools that help you move around (mobility aids) if they are needed. These include: Canes. Walkers. Scooters. Crutches. Turn on the lights when you go into a dark area. Replace any light bulbs as soon as they burn out. Set up your furniture so you have a clear path. Avoid moving your furniture around. If any of your floors are uneven, fix them. If there are any pets around you, be aware of where they are. Review your medicines with your doctor. Some medicines can make you feel dizzy. This can increase your chance of falling. Ask your doctor what other things that you can do to help prevent falls. This information is not intended to replace advice given to you by your health care provider. Make sure you discuss any questions you have with your health care provider. Document Released: 05/03/2009 Document Revised: 12/13/2015 Document Reviewed: 08/11/2014 Elsevier Interactive Patient Education  2017 [employer].

## 2022-02-26 NOTE — Progress Notes (Signed)
Subjective:   Kelly Peters is a 88 y.o. female who presents for Medicare Annual (Subsequent) preventive examination.  Virtual Visit via Telephone Note  I connected with  Kelly Peters on 02/26/22 at 11:15 AM EDT by telephone and verified that I am speaking with the correct person using two identifiers.  Location: Patient: Home Provider: [facility] Persons participating in the virtual visit: patient/Nurse Health Advisor   I discussed the limitations, risks, security and privacy concerns of performing an evaluation and management service by telephone and the availability of in person appointments. The patient expressed understanding and agreed to proceed.  Interactive audio and video telecommunications were attempted between this nurse and patient, however failed, due to patient having technical difficulties OR patient did not have access to video capability.  We continued and completed visit with audio only.  Some vital signs may be absent or patient reported.   Kelly Ganesh E Lakendrick Paradis, LPN   Review of Systems     Cardiac Risk Factors include: advanced age (>24men, >105 women);hypertension;smoking/ tobacco exposure;sedentary lifestyle;Other (see comment), Risk factor comments: COPD     Objective:    Today's Vitals   02/26/22 1118  BP: 127/79  Pulse: 94  SpO2: 94%  Weight: 130 lb (59 kg)   Body mass index is 24.56 kg/m.     02/26/2022   11:29 AM 12/19/2020    8:29 AM 07/01/2020    2:12 PM 07/01/2020   11:18 AM 12/15/2019    8:52 AM 12/14/2018    8:47 AM 04/14/2018   12:31 AM  Advanced Directives  Does Patient Have a Medical Advance Directive? Yes Yes Yes Yes Yes Yes   Type of Paramedic of [place];Living will Living will Living will Living will [place];Living will Living will   Does patient want to make changes to medical advance directive?   No - Patient declined  No - Patient declined No - Patient declined   Copy of [place] in Chart? No - copy requested    No - copy requested    Would patient like information on creating a medical advance directive?     No - Patient declined  No - Patient declined    Current Medications (verified) Outpatient Encounter Medications as of 02/26/2022  Medication Sig   ALPRAZolam (XANAX) 0.5 MG tablet Take 1 tablet (0.5 mg total) by mouth 2 (two) times daily as needed for anxiety.   amLODipine (NORVASC) 5 MG tablet TAKE ONE (1) TABLET BY MOUTH EVERY DAY   aspirin EC 81 MG tablet Take 81 mg by mouth daily.   omeprazole (PRILOSEC) 20 MG capsule Take 1 capsule (20 mg total) by mouth daily.   albuterol (VENTOLIN HFA) 108 (90 Base) MCG/ACT inhaler Inhale 2 puffs into the lungs QID. (Patient not taking: Reported on 12/31/2021)   [DISCONTINUED] citalopram (CELEXA) 20 MG tablet Take 1 tablet (20 mg total) by mouth daily.   No facility-administered encounter medications on file as of 02/26/2022.    Allergies (verified) Penicillins   History: Past Medical History:  Diagnosis Date   Allergy    Anxiety    COPD (chronic obstructive pulmonary disease) ([place])    Depression    Hypertension    Past Surgical History:  Procedure Laterality Date   ABDOMINAL HYSTERECTOMY     CATARACT EXTRACTION W/PHACO Left 10/03/2016   Procedure: CATARACT EXTRACTION PHACO AND INTRAOCULAR LENS PLACEMENT LEFT EYE CDE - 25.42;  Surgeon: Baruch Goldmann, MD;  Location:  [facility];  Service: Ophthalmology;  Laterality: Left;  left   CATARACT EXTRACTION W/PHACO Right 10/24/2016   Procedure: CATARACT EXTRACTION PHACO AND INTRAOCULAR LENS PLACEMENT RIGHT EYE CDE= 24.99;  Surgeon: Baruch Goldmann, MD;  Location: [facility];  Service: Ophthalmology;  Laterality: Right;  right   Family History  Problem Relation Age of Onset   Stroke Mother    Heart attack Father    Heart attack Brother    Diabetes Sister    Stroke Sister    COPD Daughter    Hypertension Daughter    Heart disease Son    Hypertension Son    Cancer  Brother        kidney cancer   Stroke Brother    COPD Brother    Diabetes Sister    Thyroid disease Daughter    Social History   Socioeconomic History   Marital status: Widowed    Spouse name: Not on file   Number of children: 3   Years of education: 7   Highest education level: 7th grade  Occupational History   Occupation: Retired  Tobacco Use   Smoking status: Former    Packs/day: 0.50    Years: 58.00    Total pack years: 29.00    Types: Cigarettes    Quit date: 07/23/2018    Years since quitting: 3.6   Smokeless tobacco: Never   Tobacco comments:    down to 4-5 cigarettes a day 05/12/18  Vaping Use   Vaping Use: Never used  Substance and Sexual Activity   Alcohol use: Not Currently   Drug use: Never   Sexual activity: Not Currently    Birth control/protection: Surgical  Other Topics Concern   Not on file  Social History Narrative   Daughter lives next door   2 of her children are here daily   One of them comes 2-3 times per week   Social Determinants of Health   Financial Resource Strain: Low Risk  (02/26/2022)   Overall Financial Resource Strain (CARDIA)    Difficulty of Paying Living Expenses: Not hard at all  Food Insecurity: No Food Insecurity (02/26/2022)   Hunger Vital Sign    Worried About Running Out of Food in the Last Year: Never true    [place] in the Last Year: Never true  Transportation Needs: No Transportation Needs (02/26/2022)   PRAPARE - Hydrologist (Medical): No    Lack of Transportation (Non-Medical): No  Physical Activity: Insufficiently Active (02/26/2022)   Exercise Vital Sign    Days of Exercise per Week: 7 days    Minutes of Exercise per Session: 10 min  Stress: No Stress Concern Present (02/26/2022)   [place]    Feeling of Stress : Only a little  Social Connections: Unknown (02/26/2022)   Social Connection and Isolation Panel [NHANES]     Frequency of Communication with Friends and Family: More than three times a week    Frequency of Social Gatherings with Friends and Family: More than three times a week    Attends Religious Services: Not on file    Active Member of Clubs or Organizations: No    Attends Archivist Meetings: Never    Marital Status: Widowed    Tobacco Counseling Counseling given: Not Answered Tobacco comments: down to 4-5 cigarettes a day 05/12/18   Clinical Intake:  Pre-visit preparation completed: Yes  Pain : No/denies pain  BMI - recorded: 24.56 Nutritional Status: BMI of 19-24  Normal Nutritional Risks: None Diabetes: No  How often do you need to have someone help you when you read instructions, pamphlets, or other written materials from your doctor or pharmacy?: 1 - Never  Diabetic? no  Interpreter Needed?: No  Information entered by :: Samona Chihuahua, LPN   Activities of Daily Living    02/26/2022   11:29 AM  In your present state of health, do you have any difficulty performing the following activities:  Hearing? 1  Vision? 0  Difficulty concentrating or making decisions? 1  Walking or climbing stairs? 1  Dressing or bathing? 0  Doing errands, shopping? 0  Preparing Food and eating ? N  Using the Toilet? N  In the past six months, have you accidently leaked urine? Y  Do you have problems with loss of bowel control? N  Managing your Medications? N  Managing your Finances? N  Housekeeping or managing your Housekeeping? N    Patient Care Team: Sharion Balloon, FNP as PCP - General (Family Medicine)  Indicate any recent Medical Services you may have received from other than [facility] providers in the past year (date may be approximate).     Assessment:   This is a routine wellness examination for Corrine.  Hearing/Vision screen Hearing Screening - Comments:: Wears hearing aids - she ordered them from TV  Dietary issues and exercise activities  discussed: Current Exercise Habits: Home exercise routine, Type of exercise: walking;stretching, Time (Minutes): 15, Frequency (Times/Week): 7, Weekly Exercise (Minutes/Week): 105, Intensity: Mild, Exercise limited by: respiratory conditions(s)   Goals Addressed             This Visit's Progress    Patient Stated       Stay healthy and active        Depression Screen    02/26/2022   11:26 AM 10/01/2021   11:03 AM 03/13/2021   11:29 AM 03/13/2021   11:24 AM 02/05/2021   10:28 AM 12/19/2020    8:31 AM 10/24/2020   11:44 AM  PHQ 2/9 Scores  PHQ - 2 Score 0 0 0 0 2 0 0  PHQ- 9 Score  0   10 0 0    Fall Risk    02/26/2022   11:20 AM 10/01/2021   11:03 AM 07/02/2021   10:39 AM 03/13/2021   11:24 AM 02/05/2021   10:28 AM  Fall Risk   Falls in the past year? 0 0 0 0 0  Number falls in past yr: 0      Injury with Fall? 0      Risk for fall due to : Mental status change      Follow up Falls prevention discussed;Education provided        [place]:  Any stairs in or around the home? Yes  If so, are there any without handrails? No  Home free of loose throw rugs in walkways, pet beds, electrical cords, etc? Yes  Adequate lighting in your home to reduce risk of falls? Yes   ASSISTIVE DEVICES UTILIZED TO PREVENT FALLS:  Life alert? No  Use of a cane, walker or w/c? No  Grab bars in the bathroom? No  Shower chair or bench in shower? No  Elevated toilet seat or a handicapped toilet? No   TIMED UP AND GO:  Was the test performed? No . Telephonic visit  Cognitive Function:  02/26/2022   11:31 AM 12/19/2020    8:32 AM 12/15/2019    8:58 AM 12/14/2018    8:54 AM  6CIT Screen  What Year? 0 points 0 points 0 points 0 points  What month? 0 points 0 points 0 points 0 points  What time? 0 points 0 points 0 points 0 points  Count back from 20 0 points 2 points 0 points 2 points  Months in reverse 0 points 2 points 0 points 0 points  Repeat  phrase 0 points 0 points 2 points 2 points  Total Score 0 points 4 points 2 points 4 points    Immunizations Immunization History  Administered Date(s) Administered   Influenza, High Dose Seasonal PF 04/21/2018   Tdap 07/01/2020    TDAP status: Up to date  Flu Vaccine status: Declined, Education has been provided regarding the importance of this vaccine but patient still declined. Advised may receive this vaccine at local pharmacy or Health Dept. Aware to provide a copy of the vaccination record if obtained from local pharmacy or Health Dept. Verbalized acceptance and understanding.  Pneumococcal vaccine status: Declined,  Education has been provided regarding the importance of this vaccine but patient still declined. Advised may receive this vaccine at local pharmacy or Health Dept. Aware to provide a copy of the vaccination record if obtained from local pharmacy or Health Dept. Verbalized acceptance and understanding.   Covid-19 vaccine status: Declined, Education has been provided regarding the importance of this vaccine but patient still declined. Advised may receive this vaccine at local pharmacy or Health Dept.or vaccine clinic. Aware to provide a copy of the vaccination record if obtained from local pharmacy or Health Dept. Verbalized acceptance and understanding.  Qualifies for Shingles Vaccine? Yes   Zostavax completed No   Shingrix Completed?: No.    Education has been provided regarding the importance of this vaccine. Patient has been advised to call insurance company to determine out of pocket expense if they have not yet received this vaccine. Advised may also receive vaccine at local pharmacy or Health Dept. Verbalized acceptance and understanding.  Screening Tests Health Maintenance  Topic Date Due   Zoster Vaccines- Shingrix (1 of 2) Never done   INFLUENZA VACCINE  02/18/2022   Pneumonia Vaccine 51+ Years old (1 - PCV) 07/02/2022 (Originally 01/29/1936)   DEXA SCAN   01/01/2023 (Originally 01/29/1995)   TETANUS/TDAP  07/01/2030   HPV VACCINES  Aged Out   COVID-19 Vaccine  Discontinued    Health Maintenance  Health Maintenance Due  Topic Date Due   Zoster Vaccines- Shingrix (1 of 2) Never done   INFLUENZA VACCINE  02/18/2022    Colorectal cancer screening: No longer required.   Mammogram status: No longer required due to age and declined.  DECLINED DEXA  Lung Cancer Screening: (Low Dose CT Chest recommended if Age 74-80 years, 30 pack-year currently smoking OR have quit w/in 15years.) does not qualify.   Additional Screening:  Hepatitis C Screening: does not qualify  Vision Screening: Recommended annual ophthalmology exams for early detection of glaucoma and other disorders of the eye. Is the patient up to date with their annual eye exam?  Yes  Who is the provider or what is the name of the office in which the patient attends annual eye exams? [facility] If pt is not established with a provider, would they like to be referred to a provider to establish care? No .   Dental Screening: Recommended annual dental exams  for proper oral hygiene  Community Resource Referral / Chronic Care Management: CRR required this visit?  No   CCM required this visit?  No      Plan:     I have personally reviewed and noted the following in the patient's chart:   Medical and social history Use of alcohol, tobacco or illicit drugs  Current medications and supplements including opioid prescriptions.  Functional ability and status Nutritional status Physical activity Advanced directives List of other physicians Hospitalizations, surgeries, and ER visits in previous 12 months Vitals Screenings to include cognitive, depression, and falls Referrals and appointments  In addition, I have reviewed and discussed with patient certain preventive protocols, quality metrics, and best practice recommendations. A written personalized care plan for  preventive services as well as general preventive health recommendations were provided to patient.     Sandrea Hammond, LPN   11/18/6999   Nurse Notes: None

## 2022-03-07 ENCOUNTER — Other Ambulatory Visit: Payer: Self-pay | Admitting: Family

## 2022-03-07 DIAGNOSIS — Z79899 Other long term (current) drug therapy: Secondary | ICD-10-CM

## 2022-03-07 DIAGNOSIS — F1320 Sedative, hypnotic or anxiolytic dependence, uncomplicated: Secondary | ICD-10-CM

## 2022-03-07 DIAGNOSIS — F419 Anxiety disorder, unspecified: Secondary | ICD-10-CM

## 2022-04-04 ENCOUNTER — Ambulatory Visit (INDEPENDENT_AMBULATORY_CARE_PROVIDER_SITE_OTHER): Payer: Medicare Other | Admitting: Family

## 2022-04-04 ENCOUNTER — Encounter: Payer: Self-pay | Admitting: Family

## 2022-04-04 ENCOUNTER — Other Ambulatory Visit: Payer: Self-pay | Admitting: Family

## 2022-04-04 VITALS — BP 132/58 | HR 91 | Temp 97.3°F | Ht 61.0 in | Wt 129.2 lb

## 2022-04-04 DIAGNOSIS — N1832 Chronic kidney disease, stage 3b: Secondary | ICD-10-CM | POA: Diagnosis not present

## 2022-04-04 DIAGNOSIS — K2100 Gastro-esophageal reflux disease with esophagitis, without bleeding: Secondary | ICD-10-CM

## 2022-04-04 DIAGNOSIS — F339 Major depressive disorder, recurrent, unspecified: Secondary | ICD-10-CM

## 2022-04-04 DIAGNOSIS — I10 Essential (primary) hypertension: Secondary | ICD-10-CM

## 2022-04-04 DIAGNOSIS — F1320 Sedative, hypnotic or anxiolytic dependence, uncomplicated: Secondary | ICD-10-CM

## 2022-04-04 DIAGNOSIS — F419 Anxiety disorder, unspecified: Secondary | ICD-10-CM | POA: Diagnosis not present

## 2022-04-04 DIAGNOSIS — J449 Chronic obstructive pulmonary disease, unspecified: Secondary | ICD-10-CM

## 2022-04-04 DIAGNOSIS — Z79899 Other long term (current) drug therapy: Secondary | ICD-10-CM | POA: Diagnosis not present

## 2022-04-04 DIAGNOSIS — K219 Gastro-esophageal reflux disease without esophagitis: Secondary | ICD-10-CM | POA: Diagnosis not present

## 2022-04-04 DIAGNOSIS — E538 Deficiency of other specified B group vitamins: Secondary | ICD-10-CM

## 2022-04-04 MED ORDER — ALPRAZOLAM 0.5 MG PO TABS: 0.5000 mg | ORAL_TABLET | Freq: Two times a day (BID) | ORAL | 2 refills | Status: DC | PRN

## 2022-04-04 MED ORDER — AMLODIPINE BESYLATE 5 MG PO TABS: ORAL_TABLET | ORAL | 1 refills | Status: DC

## 2022-04-04 MED ORDER — OMEPRAZOLE 20 MG PO CPDR: 20.0000 mg | DELAYED_RELEASE_CAPSULE | Freq: Every day | ORAL | 11 refills | Status: DC

## 2022-04-04 NOTE — Patient Instructions (Addendum)
Chronic Kidney Disease, Adult Chronic kidney disease (CKD) occurs when the kidneys are slowly and permanently damaged over a long period of time. The kidneys are a pair of organs that do many important jobs in the body, including: Removing waste and extra fluid from the blood to make urine. Making hormones that maintain the amount of fluid in tissues and blood vessels. Maintaining the right amount of fluids and chemicals in the body. A small amount of kidney damage may not cause problems, but a large amount of damage may make it hard or impossible for the kidneys to work right. Steps must be taken to slow kidney damage or to stop it from getting worse. If steps are not taken, the kidneys may stop working permanently (end-stage renal disease, or ESRD). Most of the time, CKD does not go away, but it can often be controlled. People who have CKD are usually able to live full lives. What are the causes? The most common causes of this condition are diabetes and high blood pressure (hypertension). Other causes include: Cardiovascular diseases. These affect the heart and blood vessels. Kidney diseases. These include: Glomerulonephritis, or inflammation of the tiny filters in the kidneys. Interstitial nephritis. This is swelling of the small tubes of the kidneys and of the surrounding structures. Polycystic kidney disease, in which clusters of fluid-filled sacs form within the kidneys. Renal vascular disease. This includes disorders that affect the arteries and veins of the kidneys. Diseases that affect the body's defense system (immune system). A problem with urine flow. This may be caused by: Kidney stones. Cancer. An enlarged prostate, in males. A kidney infection or urinary tract infection (UTI) that keeps coming back. Vasculitis. This is swelling or inflammation of the blood vessels. What increases the risk? Your chances of having kidney disease increase with age. The following factors may make  you more likely to develop this condition: A family history of kidney disease or kidney failure. Kidney failure means the kidneys can no longer work right. Certain genetic diseases. Taking medicines often that are damaging to the kidneys. Being around or being in contact with toxic substances. Obesity. A history of tobacco use. What are the signs or symptoms? Symptoms of this condition include: Feeling very tired (lethargic) and having less energy. Swelling, or edema, of the face, legs, ankles, or feet. Nausea or vomiting, or loss of appetite. Confusion or trouble concentrating. Muscle twitches and cramps, especially in the legs. Dry, itchy skin. A metallic taste in the mouth. Producing less urine, or producing more urine (especially at night). Shortness of breath. Trouble sleeping. CKD may also result in not having enough red blood cells or hemoglobin in the blood (anemia) or having weak bones (bone disease). Symptoms develop slowly and may not be obvious until the kidney damage becomes severe. It is possible to have kidney disease for years without having symptoms. How is this diagnosed? This condition may be diagnosed based on: Blood tests. Urine tests. Imaging tests, such as an ultrasound or a CT scan. A kidney biopsy. This involves removing a sample of kidney tissue to be looked at under a microscope. Results from these tests will help to determine how serious the CKD is. How is this treated? There is no cure for most cases of this condition, but treatment usually relieves symptoms and prevents or slows the worsening of the disease. Treatment may include: Diet changes, which may require you to avoid alcohol and foods that are high in salt, potassium, phosphorous, and protein. Medicines. These may:   Lower blood pressure. Control blood sugar (glucose). Relieve anemia. Relieve swelling. Protect your bones. Improve the balance of salts and minerals in your blood  (electrolytes). Dialysis, which is a type of treatment that removes toxic waste from the body. It may be needed if you have kidney failure. Managing any other conditions that are causing your CKD or making it worse. Follow these instructions at home: Medicines Take over-the-counter and prescription medicines only as told by your health care provider. The amount of some medicines that you take may need to be changed. Do not take any new medicines unless approved by your health care provider. Many medicines can make kidney damage worse. Do not take any vitamin and mineral supplements unless approved by your health care provider. Many nutritional supplements can make kidney damage worse. Lifestyle  Do not use any products that contain nicotine or tobacco, such as cigarettes, e-cigarettes, and chewing tobacco. If you need help quitting, ask your health care provider. If you drink alcohol: Limit how much you use to: 0-1 drink a day for women who are not pregnant. 0-2 drinks a day for men. Know how much alcohol is in your drink. In the [place] one drink equals one 12 oz bottle of beer (355 mL), one 5 oz glass of wine (148 mL), or one 1 oz glass of hard liquor (44 mL). Maintain a healthy weight. If you need help, ask your health care provider. General instructions  Follow instructions from your health care provider about eating or drinking restrictions, including any prescribed diet. Track your blood pressure at home. Report changes in your blood pressure as told. If you are being treated for diabetes, track your blood glucose levels as told. Start or continue an exercise plan. Exercise at least 30 minutes a day, 5 days a week. Keep your immunizations up to date as told. Keep all follow-up visits. This is important. Where to find more information American Association of Kidney Patients: www.aakp.org National Kidney Foundation: www.kidney.org American Kidney Fund: www.akfinc.org Life Options:  www.lifeoptions.org Kidney School: www.kidneyschool.org Contact a health care provider if: Your symptoms get worse. You develop new symptoms. Get help right away if: You develop symptoms of ESRD. These include: Headaches. Numbness in your hands or feet. Easy bruising. Frequent hiccups. Chest pain. Shortness of breath. Lack of menstrual periods, in women. You have a fever. You are producing less urine than usual. You have pain or bleeding when you urinate or when you have a bowel movement. These symptoms may represent a serious problem that is an emergency. Do not wait to see if the symptoms will go away. Get medical help right away. Call your local emergency services (911 in the [place] Do not drive yourself to the hospital. Summary Chronic kidney disease (CKD) occurs when the kidneys become damaged slowly over a long period of time. The most common causes of this condition are diabetes and high blood pressure (hypertension). There is no cure for most cases of CKD, but treatment usually relieves symptoms and prevents or slows the worsening of the disease. Treatment may include a combination of lifestyle changes, medicines, and dialysis. This information is not intended to replace advice given to you by your health care provider. Make sure you discuss any questions you have with your health care provider. Document Revised: 10/12/2019 Document Reviewed: 10/12/2019 Elsevier Patient Education  2023 [employer]  

## 2022-04-04 NOTE — Progress Notes (Signed)
Subjective:    Patient ID: Kelly Peters, female    DOB: 10/03/1929, 88 y.o.   MRN: [id number]  Chief Complaint  Patient presents with   Medical Management of Chronic Issues   PT presents to the office today for chronic follow up. She has COPD and states her breathing is "good". She has an albuterol, but doesn't have to use it. Quit smoking 07/2018.  She has CKD and avoids NSAID's.     04/04/2022   11:11 AM 02/26/2022   11:18 AM 12/31/2021   10:48 AM  Last 3 Weights  Weight (lbs) 129 lb 3.2 oz 130 lb 136 lb 3.2 oz  Weight (kg) 58.605 kg 58.968 kg 61.78 kg     Hypertension This is a chronic problem. The current episode started more than 1 year ago. The problem has been resolved since onset. The problem is controlled. Associated symptoms include anxiety. Pertinent negatives include no malaise/fatigue, peripheral edema or shortness of breath. Risk factors for coronary artery disease include dyslipidemia and sedentary lifestyle. The current treatment provides moderate improvement. There is no history of heart failure.  Gastroesophageal Reflux She complains of belching and heartburn. This is a chronic problem. The current episode started more than 1 year ago. The problem occurs occasionally. Associated symptoms include fatigue. She has tried a PPI for the symptoms. The treatment provided moderate relief.  Depression        This is a chronic problem.  The current episode started more than 1 year ago.   The onset quality is gradual.   Associated symptoms include fatigue, helplessness, hopelessness and sad.  Past treatments include nothing.  Past medical history includes anxiety.   Anxiety Presents for follow-up visit. Symptoms include depressed mood, excessive worry and nervous/anxious behavior. Patient reports no irritability or shortness of breath. Symptoms occur occasionally. The severity of symptoms is mild.        Review of Systems  Constitutional:  Positive for fatigue. Negative  for irritability and malaise/fatigue.  Respiratory:  Negative for shortness of breath.   Gastrointestinal:  Positive for heartburn.  Psychiatric/Behavioral:  Positive for depression. The patient is nervous/anxious.   All other systems reviewed and are negative.      Objective:   Physical Exam Vitals reviewed.  Constitutional:      General: She is not in acute distress.    Appearance: She is well-developed.  HENT:     Head: Normocephalic and atraumatic.     Right Ear: Tympanic membrane normal.     Left Ear: Tympanic membrane normal.  Eyes:     Pupils: Pupils are equal, round, and reactive to light.  Neck:     Thyroid: No thyromegaly.  Cardiovascular:     Rate and Rhythm: Normal rate and regular rhythm.     Heart sounds: Normal heart sounds. No murmur heard. Pulmonary:     Effort: Pulmonary effort is normal. No respiratory distress.     Breath sounds: Normal breath sounds. No wheezing.  Abdominal:     General: Bowel sounds are normal. There is no distension.     Palpations: Abdomen is soft.     Tenderness: There is no abdominal tenderness.  Musculoskeletal:        General: No tenderness. Normal range of motion.     Cervical back: Normal range of motion and neck supple.  Skin:    General: Skin is warm and dry.  Neurological:     Mental Status: She is alert and oriented to person, place,  and time.     Cranial Nerves: No cranial nerve deficit.     Deep Tendon Reflexes: Reflexes are normal and symmetric.  Psychiatric:        Behavior: Behavior normal.        Thought Content: Thought content normal.        Judgment: Judgment normal.       BP (!) 132/58   Pulse 91   Temp (!) 97.3 F (36.3 C) (Temporal)   Ht _0  (1.549 m)   Wt 129 lb 3.2 oz (58.6 kg)   BMI 24.41 kg/m      Assessment & Plan:  [name] comes in today with chief complaint of Medical Management of Chronic Issues   Diagnosis and orders addressed:  1. Anxiety - ALPRAZolam (XANAX) 0.5 MG  tablet; Take 1 tablet (0.5 mg total) by mouth 2 (two) times daily as needed for anxiety.  Dispense: 45 tablet; Refill: 2 - CMP14+EGFR  2. Benzodiazepine dependence (HCC) - ALPRAZolam (XANAX) 0.5 MG tablet; Take 1 tablet (0.5 mg total) by mouth 2 (two) times daily as needed for anxiety.  Dispense: 45 tablet; Refill: 2 - CMP14+EGFR  3. Controlled substance agreement signed - ALPRAZolam (XANAX) 0.5 MG tablet; Take 1 tablet (0.5 mg total) by mouth 2 (two) times daily as needed for anxiety.  Dispense: 45 tablet; Refill: 2 - CMP14+EGFR  4. Essential hypertension - amLODipine (NORVASC) 5 MG tablet; TAKE ONE (1) TABLET BY MOUTH EVERY DAY  Dispense: 90 tablet; Refill: 1 - CMP14+EGFR  5. Gastroesophageal reflux disease, unspecified whether esophagitis present - omeprazole (PRILOSEC) 20 MG capsule; Take 1 capsule (20 mg total) by mouth daily.  Dispense: 30 capsule; Refill: 11 - CMP14+EGFR  6. Moderate COPD (chronic obstructive pulmonary disease) (HCC) - CMP14+EGFR  7. Gastroesophageal reflux disease with esophagitis, unspecified whether hemorrhage - CMP14+EGFR  8. Depression, recurrent ([place]) - CMP14+EGFR  9. Vitamin B 12 deficiency - CMP14+EGFR  10. Stage 3b chronic kidney disease ([place]) - CMP14+EGFR   Labs pending Patient reviewed in [place] controlled database, no flags noted. Contract and drug screen are up to date.  Health Maintenance reviewed Diet and exercise encouraged  Follow up plan: 3 months   [name], FNP

## 2022-07-03 ENCOUNTER — Other Ambulatory Visit: Payer: Self-pay | Admitting: Family

## 2022-07-03 DIAGNOSIS — F1320 Sedative, hypnotic or anxiolytic dependence, uncomplicated: Secondary | ICD-10-CM

## 2022-07-03 DIAGNOSIS — F419 Anxiety disorder, unspecified: Secondary | ICD-10-CM

## 2022-07-03 DIAGNOSIS — Z79899 Other long term (current) drug therapy: Secondary | ICD-10-CM

## 2022-07-07 ENCOUNTER — Telehealth: Payer: Self-pay | Admitting: Family

## 2022-07-07 DIAGNOSIS — Z79899 Other long term (current) drug therapy: Secondary | ICD-10-CM

## 2022-07-07 DIAGNOSIS — F419 Anxiety disorder, unspecified: Secondary | ICD-10-CM

## 2022-07-07 DIAGNOSIS — F1320 Sedative, hypnotic or anxiolytic dependence, uncomplicated: Secondary | ICD-10-CM

## 2022-07-07 NOTE — Telephone Encounter (Signed)
Pt aware PCP can see her tomorrow 07/08/22 at 2 pm, she is able to do this.

## 2022-07-07 NOTE — Telephone Encounter (Signed)
  Prescription Request  07/07/2022  Is this a "Controlled Substance" medicine? yes  Have you seen your PCP in the last 2 weeks? Appt made for 1/11  If YES, route message to pool  -  If NO, patient needs to be scheduled for appointment.  What is the name of the medication or equipment?  ALPRAZolam (XANAX) 0.5 MG tablet    Have you contacted your pharmacy to request a refill? yes   Which pharmacy would you like this sent to?  [facility] - [place], [place]      Patient notified that their request is being sent to the clinical staff for review and that they should receive a response within 2 business days.

## 2022-07-08 ENCOUNTER — Ambulatory Visit (INDEPENDENT_AMBULATORY_CARE_PROVIDER_SITE_OTHER): Payer: Medicare Other | Admitting: Family

## 2022-07-08 ENCOUNTER — Encounter: Payer: Self-pay | Admitting: Family

## 2022-07-08 VITALS — BP 137/71 | HR 94 | Temp 97.8°F | Ht 61.0 in | Wt 130.8 lb

## 2022-07-08 DIAGNOSIS — Z79899 Other long term (current) drug therapy: Secondary | ICD-10-CM | POA: Diagnosis not present

## 2022-07-08 DIAGNOSIS — K2100 Gastro-esophageal reflux disease with esophagitis, without bleeding: Secondary | ICD-10-CM | POA: Diagnosis not present

## 2022-07-08 DIAGNOSIS — Z0000 Encounter for general adult medical examination without abnormal findings: Secondary | ICD-10-CM

## 2022-07-08 DIAGNOSIS — E538 Deficiency of other specified B group vitamins: Secondary | ICD-10-CM

## 2022-07-08 DIAGNOSIS — F419 Anxiety disorder, unspecified: Secondary | ICD-10-CM | POA: Diagnosis not present

## 2022-07-08 DIAGNOSIS — I10 Essential (primary) hypertension: Secondary | ICD-10-CM

## 2022-07-08 DIAGNOSIS — F1320 Sedative, hypnotic or anxiolytic dependence, uncomplicated: Secondary | ICD-10-CM

## 2022-07-08 DIAGNOSIS — Z0001 Encounter for general adult medical examination with abnormal findings: Secondary | ICD-10-CM | POA: Diagnosis not present

## 2022-07-08 DIAGNOSIS — Z87891 Personal history of nicotine dependence: Secondary | ICD-10-CM | POA: Diagnosis not present

## 2022-07-08 DIAGNOSIS — J449 Chronic obstructive pulmonary disease, unspecified: Secondary | ICD-10-CM | POA: Diagnosis not present

## 2022-07-08 DIAGNOSIS — K5900 Constipation, unspecified: Secondary | ICD-10-CM

## 2022-07-08 DIAGNOSIS — N1832 Chronic kidney disease, stage 3b: Secondary | ICD-10-CM

## 2022-07-08 DIAGNOSIS — F339 Major depressive disorder, recurrent, unspecified: Secondary | ICD-10-CM

## 2022-07-08 MED ORDER — CITALOPRAM HYDROBROMIDE 20 MG PO TABS: 20.0000 mg | ORAL_TABLET | Freq: Every day | ORAL | 3 refills | Status: DC

## 2022-07-08 MED ORDER — ALPRAZOLAM 0.5 MG PO TABS: 0.5000 mg | ORAL_TABLET | Freq: Two times a day (BID) | ORAL | 2 refills | Status: DC | PRN

## 2022-07-08 NOTE — Patient Instructions (Signed)
Seasonal Affective Disorder Seasonal affective disorder (SAD) is a type of depression. It is when you feel depressed at specific times of the year. SAD is most common during late fall and winter when the days are shorter and most people spend less time outdoors. This is why SAD is also known as the "winter blues." SAD occurs less commonly in the spring or summer, but if it does, it may be associated with overexposure to light or high pollen counts. SAD can be mild to severe, and it can interfere with work, school, relationships, and normal daily activities. What are the causes? The cause of this condition is not known. It may be related to changes in brain chemistry that are caused by having more or less exposure to daylight. What increases the risk? You are more likely to develop this condition if: You are female. You live far [place] or far [place] of the equator. These areas get less sunlight and have longer winter seasons. You have a personal history of depression or bipolar disorder. You have a family history of mental health conditions. What are the signs or symptoms? Symptoms of this condition include: Mood changes, such as: Feeling sad or teary. Having crying spells. Irritability. Feelings of guilt or worthlessness. Thinking about self-harm or attempting suicide. Physical changes, such as: Restlessness or loss of energy. Difficulty concentrating, remembering, or making decisions. A significant change in appetite or weight. Behavioral changes, such as: Trouble sleeping, or sleeping more than usual. Loss of interest in activities that you usually enjoy. Overeating or craving sweet foods. Avoiding social situations (social withdrawal), or feeling like "hibernating." Symptoms associated with the less common summer pattern of SAD include: Loss of appetite. Weight loss. Trouble sleeping. Episodes of violent behavior (in severe cases). How is this diagnosed? This condition is usually  diagnosed through an assessment with your health care provider. You will be asked about your moods, thoughts, and behaviors, and if you have noticed a seasonal pattern. You will also be asked about your medical history, any major life changes, and any medicines and substances that you use. You may have a physical exam and blood tests to rule out other possible causes of your symptoms. You may be referred to a mental health specialist for more evaluation. How is this treated? Treatment for this condition may include: Light therapy. This therapy involves sitting in front of a light source for 15-30 minutes every day. The light source may be: A light box designed specifically to treat SAD. A dawn simulator or sunrise clock. This is a timer-activated light source that copies the sunrise by slowly becoming brighter. This can help to activate your body's internal clock. Antidepressant medicine. Cognitive behavioral therapy (CBT). CBT is a form of talk therapy that helps to identify and change negative thoughts that are associated with SAD. Changes to your dietary, exercise, or sleeping habits. A healthy lifestyle may help to prevent or relieve symptoms. Follow these instructions at home: Medicines Take over-the-counter and prescription medicines only as told by your health care provider. If you are taking antidepressant medicines, ask your health care provider what side effects you should be aware of. Talk with your health care provider before you start taking any new prescription or over-the-counter medicines, herbs, or supplements. Lifestyle Eat a healthy diet that includes fruits and vegetables, whole grains, and lean proteins. Get plenty of sleep. To improve your sleep, make sure you: Keep your bedroom dark and cool. Go to sleep and wake up at about the same  time every day. Do not keep screens, such as a TV or smartphone, in your bedroom. Limit your screen time starting a few hours before  bedtime. Exercise regularly. Limit alcohol and caffeine as told by your health care provider. General instructions Attend CBT therapy sessions as directed. For fall and winter SAD only: Make your home and work environment as sunny or bright as possible. Open window blinds and move furniture closer to windows. Spend as much time outside as possible. Use light therapy for 15-30 minutes every day, or as often as directed. This should start in the early fall and continue until spring. If SAD happens during the summer, light treatment would not be recommended. Keep all follow-up visits. This is important. Where to find more information National Institute of Mental Health: https://carter.com/ National Alliance on Mental Illness: www.nami.org Contact a health care provider if: Your symptoms do not get better or they get worse. You have trouble taking care of yourself. You are using drugs or alcohol to cope with your symptoms. You have side effects from medicines. Get help right away if: You have thoughts about hurting yourself or others. If you ever feel like you may hurt yourself or others, or have thoughts about taking your own life, get help right away. Go to your nearest emergency department or: Call your local emergency services (911 in the [place] Call a suicide crisis helpline, such as the [facility] at (973)634-7297 or 988 in the [place]. This is open 24 hours a day in the [place] Text the Crisis Text Line at 934-799-4126 (in the [place].). Summary Seasonal affective disorder (SAD) is a type of depression that is associated with specific times of the year (usually fall and winter). This condition may be treated with light therapy, talk therapy, and antidepressant medicines. To help treat your condition, take good care of yourself and keep all follow-up visits with your health care provider. Get help right away if you have thoughts about hurting yourself or others. This information  is not intended to replace advice given to you by your health care provider. Make sure you discuss any questions you have with your health care provider. Document Revised: 01/30/2021 Document Reviewed: 10/25/2020 Elsevier Patient Education  [place].

## 2022-07-08 NOTE — Progress Notes (Signed)
Subjective:    Patient ID: Kelly Peters, female    DOB: Dec 26, 1929, 88 y.o.   MRN: [id number]  Chief Complaint  Patient presents with   Medical Management of Chronic Issues   PT presents to the office today for  CPE and chronic follow up. She has COPD and states her breathing is "fine". She has an albuterol, but doesn't have to use it. Quit smoking 07/2018.   She has CKD and avoids NSAID's. Hypertension This is a chronic problem. The current episode started more than 1 year ago. The problem has been resolved since onset. The problem is controlled. Associated symptoms include anxiety and peripheral edema. Pertinent negatives include no malaise/fatigue or shortness of breath. Risk factors for coronary artery disease include dyslipidemia and sedentary lifestyle. The current treatment provides moderate improvement.  Gastroesophageal Reflux She complains of belching and heartburn. This is a chronic problem. The current episode started more than 1 year ago. The problem occurs occasionally. Pertinent negatives include no fatigue. She has tried a PPI for the symptoms. The treatment provided moderate relief.  Depression        This is a chronic problem.  The current episode started more than 1 month ago.   The onset quality is gradual.   Associated symptoms include restlessness and sad.  Associated symptoms include no fatigue, no helplessness and no hopelessness.  Past treatments include nothing.  Compliance with treatment is good.  Past medical history includes anxiety.   Anxiety Presents for follow-up visit. Symptoms include depressed mood, excessive worry, nervous/anxious behavior and restlessness. Patient reports no shortness of breath. Symptoms occur occasionally. The severity of symptoms is mild.    Constipation This is a chronic problem. The current episode started more than 1 year ago. The problem has been waxing and waning since onset. Her stool frequency is 1 time per day. She has tried  diet changes for the symptoms. The treatment provided moderate relief.      Review of Systems  Constitutional:  Negative for fatigue and malaise/fatigue.  Respiratory:  Negative for shortness of breath.   Gastrointestinal:  Positive for constipation and heartburn.  Psychiatric/Behavioral:  Positive for depression. The patient is nervous/anxious.   All other systems reviewed and are negative.  Family History  Problem Relation Age of Onset   Stroke Mother    Heart attack Father    Heart attack Brother    Diabetes Sister    Stroke Sister    COPD Daughter    Hypertension Daughter    Heart disease Son    Hypertension Son    Cancer Brother        kidney cancer   Stroke Brother    COPD Brother    Diabetes Sister    Thyroid disease Daughter    Social History   Socioeconomic History   Marital status: Widowed    Spouse name: Not on file   Number of children: 3   Years of education: 7   Highest education level: 7th grade  Occupational History   Occupation: Retired  Tobacco Use   Smoking status: Former    Packs/day: 0.50    Years: 58.00    Total pack years: 29.00    Types: Cigarettes    Quit date: 07/23/2018    Years since quitting: 3.9   Smokeless tobacco: Never   Tobacco comments:    down to 4-5 cigarettes a day 05/12/18  Vaping Use   Vaping Use: Never used  Substance and Sexual Activity  Alcohol use: Not Currently   Drug use: Never   Sexual activity: Not Currently    Birth control/protection: Surgical  Other Topics Concern   Not on file  Social History Narrative   Daughter lives next door   2 of her children are here daily   One of them comes 2-3 times per week   Social Determinants of Health   Financial Resource Strain: Low Risk  (02/26/2022)   Overall Financial Resource Strain (CARDIA)    Difficulty of Paying Living Expenses: Not hard at all  Food Insecurity: No Food Insecurity (02/26/2022)   Hunger Vital Sign    Worried About Running Out of Food in the  Last Year: Never true    [place] in the Last Year: Never true  Transportation Needs: No Transportation Needs (02/26/2022)   PRAPARE - Hydrologist (Medical): No    Lack of Transportation (Non-Medical): No  Physical Activity: Insufficiently Active (02/26/2022)   Exercise Vital Sign    Days of Exercise per Week: 7 days    Minutes of Exercise per Session: 10 min  Stress: No Stress Concern Present (02/26/2022)   [place]    Feeling of Stress : Only a little  Social Connections: Unknown (02/26/2022)   Social Connection and Isolation Panel [NHANES]    Frequency of Communication with Friends and Family: More than three times a week    Frequency of Social Gatherings with Friends and Family: More than three times a week    Attends Religious Services: Not on file    Active Member of Clubs or Organizations: No    Attends Archivist Meetings: Never    Marital Status: Widowed       Objective:   Physical Exam Vitals reviewed.  Constitutional:      General: She is not in acute distress.    Appearance: She is well-developed.  HENT:     Head: Normocephalic and atraumatic.     Right Ear: Tympanic membrane normal.     Left Ear: Tympanic membrane normal.  Eyes:     Pupils: Pupils are equal, round, and reactive to light.  Neck:     Thyroid: No thyromegaly.  Cardiovascular:     Rate and Rhythm: Normal rate and regular rhythm.     Heart sounds: Normal heart sounds. No murmur heard. Pulmonary:     Effort: Pulmonary effort is normal. No respiratory distress.     Breath sounds: Normal breath sounds. No wheezing.  Abdominal:     General: Bowel sounds are normal. There is no distension.     Palpations: Abdomen is soft.     Tenderness: There is no abdominal tenderness.  Musculoskeletal:        General: No tenderness. Normal range of motion.     Cervical back: Normal range of motion and neck  supple.     Right lower leg: Edema (2+ in ankles) present.     Left lower leg: Edema (2+ ankles) present.  Skin:    General: Skin is warm and dry.  Neurological:     Mental Status: She is alert and oriented to person, place, and time.     Cranial Nerves: No cranial nerve deficit.     Deep Tendon Reflexes: Reflexes are normal and symmetric.  Psychiatric:        Behavior: Behavior normal.        Thought Content: Thought content normal.  Judgment: Judgment normal.       BP 137/71   Pulse 94   Temp 97.8 F (36.6 C)   Ht _0  (1.549 m)   Wt 130 lb 12.8 oz (59.3 kg)   SpO2 95%   BMI 24.71 kg/m      Assessment & Plan:  EMI LYMON comes in today with chief complaint of Medical Management of Chronic Issues   Diagnosis and orders addressed:  1. Anxiety - ALPRAZolam (XANAX) 0.5 MG tablet; Take 1 tablet (0.5 mg total) by mouth 2 (two) times daily as needed for anxiety.  Dispense: 45 tablet; Refill: 2 - CMP14+EGFR - citalopram (CELEXA) 20 MG tablet; Take 1 tablet (20 mg total) by mouth daily.  Dispense: 90 tablet; Refill: 3  2. Benzodiazepine dependence (HCC) - ALPRAZolam (XANAX) 0.5 MG tablet; Take 1 tablet (0.5 mg total) by mouth 2 (two) times daily as needed for anxiety.  Dispense: 45 tablet; Refill: 2 - CMP14+EGFR  3. Controlled substance agreement signed - ALPRAZolam (XANAX) 0.5 MG tablet; Take 1 tablet (0.5 mg total) by mouth 2 (two) times daily as needed for anxiety.  Dispense: 45 tablet; Refill: 2 - CMP14+EGFR  4. Constipation, unspecified constipation type  - CMP14+EGFR  5. Depression, recurrent ([place]) - CMP14+EGFR - citalopram (CELEXA) 20 MG tablet; Take 1 tablet (20 mg total) by mouth daily.  Dispense: 90 tablet; Refill: 3  6. Essential hypertension - CMP14+EGFR  7. Gastroesophageal reflux disease with esophagitis, unspecified whether hemorrhage - CMP14+EGFR  8. History of tobacco abuse - CMP14+EGFR  9. Moderate COPD (chronic obstructive  pulmonary disease) (HCC) - CMP14+EGFR  10. Stage 3b chronic kidney disease (HCC) - CMP14+EGFR  11. Vitamin B 12 deficiency - CMP14+EGFR  12. Annual physical exam - CMP14+EGFR - CBC with Differential/Platelet - TSH - Vitamin B12   Labs pending Patient reviewed in [place] controlled database, no flags noted. Contract and drug screen are up to date.  Health Maintenance reviewed Diet and exercise encouraged  Follow up plan: 3 months   [name], FNP

## 2022-07-09 LAB — CBC WITH DIFFERENTIAL/PLATELET
Basophils Absolute: 0.1 10*3/uL (ref 0.0–0.2)
Basos: 1 %
EOS (ABSOLUTE): 0.1 10*3/uL (ref 0.0–0.4)
Eos: 2 %
Hematocrit: 34.8 % (ref 34.0–46.6)
Hemoglobin: 11 g/dL — ABNORMAL LOW (ref 11.1–15.9)
Immature Grans (Abs): 0 10*3/uL (ref 0.0–0.1)
Immature Granulocytes: 0 %
Lymphocytes Absolute: 1.6 10*3/uL (ref 0.7–3.1)
Lymphs: 20 %
MCH: 29.5 pg (ref 26.6–33.0)
MCHC: 31.6 g/dL (ref 31.5–35.7)
MCV: 93 fL (ref 79–97)
Monocytes Absolute: 0.7 10*3/uL (ref 0.1–0.9)
Monocytes: 9 %
Neutrophils Absolute: 5.6 10*3/uL (ref 1.4–7.0)
Neutrophils: 68 %
Platelets: 195 10*3/uL (ref 150–450)
RBC: 3.73 x10E6/uL — ABNORMAL LOW (ref 3.77–5.28)
RDW: 12.6 % (ref 11.7–15.4)
WBC: 8.1 10*3/uL (ref 3.4–10.8)

## 2022-07-09 LAB — CMP14+EGFR
ALT: 8 IU/L (ref 0–32)
AST: 18 IU/L (ref 0–40)
Albumin/Globulin Ratio: 1.7 (ref 1.2–2.2)
Albumin: 4.3 g/dL (ref 3.6–4.6)
Alkaline Phosphatase: 153 IU/L — ABNORMAL HIGH (ref 44–121)
BUN/Creatinine Ratio: 14 (ref 12–28)
BUN: 18 mg/dL (ref 10–36)
Bilirubin Total: 0.5 mg/dL (ref 0.0–1.2)
CO2: 23 mmol/L (ref 20–29)
Calcium: 9.2 mg/dL (ref 8.7–10.3)
Chloride: 101 mmol/L (ref 96–106)
Creatinine, Ser: 1.32 mg/dL — ABNORMAL HIGH (ref 0.57–1.00)
Globulin, Total: 2.6 g/dL (ref 1.5–4.5)
Glucose: 102 mg/dL — ABNORMAL HIGH (ref 70–99)
Potassium: 4.3 mmol/L (ref 3.5–5.2)
Sodium: 140 mmol/L (ref 134–144)
Total Protein: 6.9 g/dL (ref 6.0–8.5)
eGFR: 38 mL/min/{1.73_m2} — ABNORMAL LOW (ref >59)

## 2022-07-09 LAB — TSH: TSH: 7.45 u[IU]/mL — ABNORMAL HIGH (ref 0.450–4.500)

## 2022-07-09 LAB — VITAMIN B12: Vitamin B-12: 390 pg/mL (ref 232–1245)

## 2022-07-10 ENCOUNTER — Other Ambulatory Visit: Payer: Self-pay | Admitting: Family

## 2022-07-31 ENCOUNTER — Ambulatory Visit: Payer: Medicare Other | Admitting: Family

## 2022-08-11 ENCOUNTER — Encounter: Payer: Self-pay | Admitting: Family

## 2022-08-11 ENCOUNTER — Ambulatory Visit (INDEPENDENT_AMBULATORY_CARE_PROVIDER_SITE_OTHER): Payer: Medicare Other | Admitting: Family

## 2022-08-11 VITALS — BP 146/78 | HR 90 | Temp 98.0°F | Ht 61.0 in | Wt 130.0 lb

## 2022-08-11 DIAGNOSIS — N898 Other specified noninflammatory disorders of vagina: Secondary | ICD-10-CM | POA: Diagnosis not present

## 2022-08-11 DIAGNOSIS — R7989 Other specified abnormal findings of blood chemistry: Secondary | ICD-10-CM

## 2022-08-11 DIAGNOSIS — R6889 Other general symptoms and signs: Secondary | ICD-10-CM | POA: Diagnosis not present

## 2022-08-11 DIAGNOSIS — R21 Rash and other nonspecific skin eruption: Secondary | ICD-10-CM

## 2022-08-11 LAB — URINALYSIS, COMPLETE
Bilirubin, UA: NEGATIVE
Glucose, UA: NEGATIVE
Ketones, UA: NEGATIVE
Nitrite, UA: NEGATIVE
Protein,UA: NEGATIVE
Specific Gravity, UA: 1.015 (ref 1.005–1.030)
Urobilinogen, Ur: 2 mg/dL — ABNORMAL HIGH (ref 0.2–1.0)
pH, UA: 7 (ref 5.0–7.5)

## 2022-08-11 LAB — WET PREP FOR TRICH, YEAST, CLUE
Clue Cell Exam: NEGATIVE
Trichomonas Exam: NEGATIVE
Yeast Exam: NEGATIVE

## 2022-08-11 LAB — MICROSCOPIC EXAMINATION: Renal Epithel, UA: NONE SEEN /hpf

## 2022-08-11 MED ORDER — CLOBETASOL PROPIONATE 0.05 % EX CREA: 1.0000 | TOPICAL_CREAM | Freq: Two times a day (BID) | CUTANEOUS | 2 refills | Status: DC

## 2022-08-11 NOTE — Patient Instructions (Signed)

## 2022-08-11 NOTE — Progress Notes (Signed)
Subjective:    Patient ID: Kelly Peters, female    DOB: 22-Jun-1930, 89 y.o.   MRN: [id number]  Chief Complaint  Patient presents with   Follow-up   Urinary Tract Infection   Pt presents to the office today to follow up on abnormal TSH. She had lab work drawn on 07/08/22 and it was 7.450. Currently does not take any medications for thyroid. Denies any fatigue or SOB.  Vaginal Itching The patient's primary symptoms include genital itching. The patient's pertinent negatives include no genital odor, vaginal bleeding or vaginal discharge. The current episode started today. The problem occurs intermittently. Associated symptoms include rash. Pertinent negatives include no dysuria or joint swelling. She has tried antifungals for the symptoms. The treatment provided mild relief.  Rash This is a new problem. The current episode started more than 1 month ago. The problem has been gradually worsening since onset. The affected locations include the right lower leg. The rash is characterized by itchiness and redness. She was exposed to nothing. Past treatments include topical steroids. The treatment provided mild relief.      Review of Systems  Genitourinary:  Negative for dysuria and vaginal discharge.  Skin:  Positive for rash.  All other systems reviewed and are negative.      Objective:   Physical Exam Vitals reviewed.  Constitutional:      General: She is not in acute distress.    Appearance: She is well-developed.  HENT:     Head: Normocephalic and atraumatic.     Right Ear: External ear normal.  Eyes:     Pupils: Pupils are equal, round, and reactive to light.  Neck:     Thyroid: No thyromegaly.  Cardiovascular:     Rate and Rhythm: Normal rate and regular rhythm.     Heart sounds: Normal heart sounds. No murmur heard. Pulmonary:     Effort: Pulmonary effort is normal. No respiratory distress.     Breath sounds: Normal breath sounds. No wheezing.  Abdominal:     General:  Bowel sounds are normal. There is no distension.     Palpations: Abdomen is soft.     Tenderness: There is no abdominal tenderness.  Musculoskeletal:        General: No tenderness. Normal range of motion.     Cervical back: Normal range of motion and neck supple.  Skin:    General: Skin is warm and dry.          Comments: Erythemas circular macule on right lower leg  Neurological:     Mental Status: She is alert and oriented to person, place, and time.     Cranial Nerves: No cranial nerve deficit.     Deep Tendon Reflexes: Reflexes are normal and symmetric.  Psychiatric:        Behavior: Behavior normal.        Thought Content: Thought content normal.        Judgment: Judgment normal.    BP (!) 146/78   Pulse 90   Temp 98 F (36.7 C) (Temporal)   Ht 5\' 1"  (1.549 m)   Wt 130 lb (59 kg)   BMI 24.56 kg/m        Assessment & Plan:  Kelly Peters comes in today with chief complaint of Follow-up and Urinary Tract Infection   Diagnosis and orders addressed:  1. Vagina itching - Urinalysis, Complete - Urine Culture - WET PREP FOR TRICH, YEAST, CLUE  2. Abnormal TSH - TSH  3. Rash   Labs pending Health Maintenance reviewed Diet and exercise encouraged  Follow up plan: Keep chronic follow up   [name], FNP

## 2022-08-12 LAB — URINE CULTURE

## 2022-08-12 LAB — TSH: TSH: 5.56 u[IU]/mL — ABNORMAL HIGH (ref 0.450–4.500)

## 2022-09-02 ENCOUNTER — Other Ambulatory Visit: Payer: Self-pay | Admitting: Family

## 2022-09-02 DIAGNOSIS — Z79899 Other long term (current) drug therapy: Secondary | ICD-10-CM

## 2022-09-02 DIAGNOSIS — F419 Anxiety disorder, unspecified: Secondary | ICD-10-CM

## 2022-09-02 DIAGNOSIS — F1320 Sedative, hypnotic or anxiolytic dependence, uncomplicated: Secondary | ICD-10-CM

## 2022-10-07 ENCOUNTER — Other Ambulatory Visit: Payer: Self-pay | Admitting: Family

## 2022-10-07 DIAGNOSIS — Z79899 Other long term (current) drug therapy: Secondary | ICD-10-CM

## 2022-10-07 DIAGNOSIS — F419 Anxiety disorder, unspecified: Secondary | ICD-10-CM

## 2022-10-07 DIAGNOSIS — F1320 Sedative, hypnotic or anxiolytic dependence, uncomplicated: Secondary | ICD-10-CM

## 2022-10-09 ENCOUNTER — Ambulatory Visit (INDEPENDENT_AMBULATORY_CARE_PROVIDER_SITE_OTHER): Payer: Medicare Other | Admitting: Family

## 2022-10-09 VITALS — BP 138/77 | HR 83 | Temp 98.0°F | Ht 61.2 in | Wt 131.0 lb

## 2022-10-09 DIAGNOSIS — F419 Anxiety disorder, unspecified: Secondary | ICD-10-CM | POA: Diagnosis not present

## 2022-10-09 DIAGNOSIS — R7989 Other specified abnormal findings of blood chemistry: Secondary | ICD-10-CM

## 2022-10-09 DIAGNOSIS — F339 Major depressive disorder, recurrent, unspecified: Secondary | ICD-10-CM

## 2022-10-09 DIAGNOSIS — F1320 Sedative, hypnotic or anxiolytic dependence, uncomplicated: Secondary | ICD-10-CM | POA: Diagnosis not present

## 2022-10-09 DIAGNOSIS — L209 Atopic dermatitis, unspecified: Secondary | ICD-10-CM | POA: Diagnosis not present

## 2022-10-09 DIAGNOSIS — N1832 Chronic kidney disease, stage 3b: Secondary | ICD-10-CM | POA: Diagnosis not present

## 2022-10-09 DIAGNOSIS — K219 Gastro-esophageal reflux disease without esophagitis: Secondary | ICD-10-CM | POA: Diagnosis not present

## 2022-10-09 DIAGNOSIS — I10 Essential (primary) hypertension: Secondary | ICD-10-CM | POA: Diagnosis not present

## 2022-10-09 DIAGNOSIS — K2100 Gastro-esophageal reflux disease with esophagitis, without bleeding: Secondary | ICD-10-CM

## 2022-10-09 DIAGNOSIS — Z79899 Other long term (current) drug therapy: Secondary | ICD-10-CM

## 2022-10-09 DIAGNOSIS — J449 Chronic obstructive pulmonary disease, unspecified: Secondary | ICD-10-CM

## 2022-10-09 MED ORDER — CLOBETASOL PROPIONATE 0.05 % EX CREA: 1.0000 | TOPICAL_CREAM | Freq: Two times a day (BID) | CUTANEOUS | 2 refills | Status: DC

## 2022-10-09 MED ORDER — ALPRAZOLAM 0.5 MG PO TABS: 0.5000 mg | ORAL_TABLET | Freq: Two times a day (BID) | ORAL | 2 refills | Status: DC | PRN

## 2022-10-09 MED ORDER — OMEPRAZOLE 20 MG PO CPDR: 20.0000 mg | DELAYED_RELEASE_CAPSULE | Freq: Every day | ORAL | 11 refills | Status: DC

## 2022-10-09 MED ORDER — AMLODIPINE BESYLATE 5 MG PO TABS: ORAL_TABLET | ORAL | 1 refills | Status: DC

## 2022-10-09 NOTE — Patient Instructions (Signed)
Health Maintenance After Age 87 After age 87, you are at a higher risk for certain long-term diseases and infections as well as injuries from falls. Falls are a major cause of broken bones and head injuries in people who are older than age 87. Getting regular preventive care can help to keep you healthy and well. Preventive care includes getting regular testing and making lifestyle changes as recommended by your health care provider. Talk with your health care provider about: Which screenings and tests you should have. A screening is a test that checks for a disease when you have no symptoms. A diet and exercise plan that is right for you. What should I know about screenings and tests to prevent falls? Screening and testing are the best ways to find a health problem early. Early diagnosis and treatment give you the best chance of managing medical conditions that are common after age 87. Certain conditions and lifestyle choices may make you more likely to have a fall. Your health care provider may recommend: Regular vision checks. Poor vision and conditions such as cataracts can make you more likely to have a fall. If you wear glasses, make sure to get your prescription updated if your vision changes. Medicine review. Work with your health care provider to regularly review all of the medicines you are taking, including over-the-counter medicines. Ask your health care provider about any side effects that may make you more likely to have a fall. Tell your health care provider if any medicines that you take make you feel dizzy or sleepy. Strength and balance checks. Your health care provider may recommend certain tests to check your strength and balance while standing, walking, or changing positions. Foot health exam. Foot pain and numbness, as well as not wearing proper footwear, can make you more likely to have a fall. Screenings, including: Osteoporosis screening. Osteoporosis is a condition that causes  the bones to get weaker and break more easily. Blood pressure screening. Blood pressure changes and medicines to control blood pressure can make you feel dizzy. Depression screening. You may be more likely to have a fall if you have a fear of falling, feel depressed, or feel unable to do activities that you used to do. Alcohol use screening. Using too much alcohol can affect your balance and may make you more likely to have a fall. Follow these instructions at home: Lifestyle Do not drink alcohol if: Your health care provider tells you not to drink. If you drink alcohol: Limit how much you have to: 0-1 drink a day for women. 0-2 drinks a day for men. Know how much alcohol is in your drink. In the [place] one drink equals one 12 oz bottle of beer (355 mL), one 5 oz glass of wine (148 mL), or one 1 oz glass of hard liquor (44 mL). Do not use any products that contain nicotine or tobacco. These products include cigarettes, chewing tobacco, and vaping devices, such as e-cigarettes. If you need help quitting, ask your health care provider. Activity  Follow a regular exercise program to stay fit. This will help you maintain your balance. Ask your health care provider what types of exercise are appropriate for you. If you need a cane or walker, use it as recommended by your health care provider. Wear supportive shoes that have nonskid soles. Safety  Remove any tripping hazards, such as rugs, cords, and clutter. Install safety equipment such as grab bars in bathrooms and safety rails on stairs. Keep rooms and walkways   well-lit. General instructions Talk with your health care provider about your risks for falling. Tell your health care provider if: You fall. Be sure to tell your health care provider about all falls, even ones that seem minor. You feel dizzy, tiredness (fatigue), or off-balance. Take over-the-counter and prescription medicines only as told by your health care provider. These include  supplements. Eat a healthy diet and maintain a healthy weight. A healthy diet includes low-fat dairy products, low-fat (lean) meats, and fiber from whole grains, beans, and lots of fruits and vegetables. Stay current with your vaccines. Schedule regular health, dental, and eye exams. Summary Having a healthy lifestyle and getting preventive care can help to protect your health and wellness after age 87. Screening and testing are the best way to find a health problem early and help you avoid having a fall. Early diagnosis and treatment give you the best chance for managing medical conditions that are more common for people who are older than age 87. Falls are a major cause of broken bones and head injuries in people who are older than age 87. Take precautions to prevent a fall at home. Work with your health care provider to learn what changes you can make to improve your health and wellness and to prevent falls. This information is not intended to replace advice given to you by your health care provider. Make sure you discuss any questions you have with your health care provider. Document Revised: 11/26/2020 Document Reviewed: 11/26/2020 Elsevier Patient Education  2023 [employer] Inc[employer]  

## 2022-10-09 NOTE — Progress Notes (Signed)
Subjective:    Patient ID: Kelly Peters, female    DOB: Jun 25, 1930, 89 y.o.   MRN: [id number]  Chief Complaint  Patient presents with   Medical Management of Chronic Issues   PT presents to the office today for chronic follow up. She has COPD and states her breathing is "fine". She has an albuterol, but doesn't have to use it. Quit smoking 07/2018.   She has CKD and avoids NSAID's. Hypertension This is a chronic problem. The current episode started more than 1 year ago. The problem has been resolved since onset. The problem is controlled. Associated symptoms include anxiety and malaise/fatigue. Pertinent negatives include no peripheral edema or shortness of breath. Risk factors for coronary artery disease include sedentary lifestyle. The current treatment provides moderate improvement.  Gastroesophageal Reflux She complains of belching, heartburn and a hoarse voice. This is a chronic problem. The current episode started more than 1 year ago. The problem occurs occasionally. Associated symptoms include fatigue. Risk factors include smoking/tobacco exposure. She has tried a PPI for the symptoms. The treatment provided moderate relief.  Depression        This is a chronic problem.  The current episode started more than 1 year ago.   The problem occurs intermittently.  Associated symptoms include fatigue and restlessness.  Associated symptoms include no helplessness, no hopelessness and not sad.  Past treatments include SSRIs - Selective serotonin reuptake inhibitors.  Past medical history includes anxiety.   Anxiety Presents for follow-up visit. Symptoms include excessive worry and restlessness. Patient reports no depressed mood, irritability or shortness of breath. Symptoms occur rarely. The severity of symptoms is moderate.        Review of Systems  Constitutional:  Positive for fatigue and malaise/fatigue. Negative for irritability.  HENT:  Positive for hoarse voice.   Respiratory:   Negative for shortness of breath.   Gastrointestinal:  Positive for heartburn.  Psychiatric/Behavioral:  Positive for depression.   All other systems reviewed and are negative.      Objective:   Physical Exam Vitals reviewed.  Constitutional:      General: She is not in acute distress.    Appearance: She is well-developed.  HENT:     Head: Normocephalic and atraumatic.     Right Ear: Tympanic membrane normal.     Left Ear: Tympanic membrane normal.  Eyes:     Pupils: Pupils are equal, round, and reactive to light.  Neck:     Thyroid: No thyromegaly.  Cardiovascular:     Rate and Rhythm: Normal rate and regular rhythm.     Heart sounds: Murmur heard.  Pulmonary:     Effort: Pulmonary effort is normal. No respiratory distress.     Breath sounds: Normal breath sounds. No wheezing.  Abdominal:     General: Bowel sounds are normal. There is no distension.     Palpations: Abdomen is soft.     Tenderness: There is no abdominal tenderness.  Musculoskeletal:        General: No tenderness. Normal range of motion.     Cervical back: Normal range of motion and neck supple.  Skin:    General: Skin is warm and dry.  Neurological:     Mental Status: She is alert and oriented to person, place, and time.     Cranial Nerves: No cranial nerve deficit.     Deep Tendon Reflexes: Reflexes are normal and symmetric.  Psychiatric:        Behavior: Behavior normal.  Thought Content: Thought content normal.        Judgment: Judgment normal.       /BP 138/77   Pulse 83   Temp 98 F (36.7 C) (Temporal)   Ht 5' 1.2" (1.554 m)   Wt 131 lb (59.4 kg)   SpO2 96%   BMI 24.59 kg/m      Assessment & Plan:  JLYNN HENGST comes in today with chief complaint of Medical Management of Chronic Issues   Diagnosis and orders addressed:  1. Anxiety - ALPRAZolam (XANAX) 0.5 MG tablet; Take 1 tablet (0.5 mg total) by mouth 2 (two) times daily as needed for anxiety.  Dispense: 45 tablet;  Refill: 2 - CMP14+EGFR  2. Benzodiazepine dependence (HCC) - ALPRAZolam (XANAX) 0.5 MG tablet; Take 1 tablet (0.5 mg total) by mouth 2 (two) times daily as needed for anxiety.  Dispense: 45 tablet; Refill: 2 - CMP14+EGFR  3. Controlled substance agreement signed - ALPRAZolam (XANAX) 0.5 MG tablet; Take 1 tablet (0.5 mg total) by mouth 2 (two) times daily as needed for anxiety.  Dispense: 45 tablet; Refill: 2 - CMP14+EGFR  4. Essential hypertension - amLODipine (NORVASC) 5 MG tablet; TAKE ONE (1) TABLET BY MOUTH EVERY DAY  Dispense: 90 tablet; Refill: 1 - CMP14+EGFR  5. Atopic dermatitis  - clobetasol cream (TEMOVATE) 0.05 %; Apply 1 Application topically 2 (two) times daily.  Dispense: 60 g; Refill: 2 - CMP14+EGFR  6. Gastroesophageal reflux disease, unspecified whether esophagitis present - omeprazole (PRILOSEC) 20 MG capsule; Take 1 capsule (20 mg total) by mouth daily.  Dispense: 30 capsule; Refill: 11 - CMP14+EGFR  7. Depression, recurrent ([place]) - CMP14+EGFR  8. Moderate COPD (chronic obstructive pulmonary disease) (HCC) - CMP14+EGFR  9. Gastroesophageal reflux disease with esophagitis, unspecified whether hemorrhage - CMP14+EGFR  10. Stage 3b chronic kidney disease (HCC) - CMP14+EGFR  11. Abnormal TSH - CMP14+EGFR - TSH   Labs pending Patient reviewed in [place] controlled database, no flags noted. Contract and drug screen are up to date.  Health Maintenance reviewed Diet and exercise encouraged  Follow up plan: 3 months    [name], FNP

## 2022-10-10 ENCOUNTER — Other Ambulatory Visit: Payer: Self-pay | Admitting: Family

## 2022-10-10 LAB — CMP14+EGFR
ALT: 6 IU/L (ref 0–32)
AST: 14 IU/L (ref 0–40)
Albumin/Globulin Ratio: 1.6 (ref 1.2–2.2)
Albumin: 4.5 g/dL (ref 3.6–4.6)
Alkaline Phosphatase: 156 IU/L — ABNORMAL HIGH (ref 44–121)
BUN/Creatinine Ratio: 15 (ref 12–28)
BUN: 22 mg/dL (ref 10–36)
Bilirubin Total: 0.4 mg/dL (ref 0.0–1.2)
CO2: 25 mmol/L (ref 20–29)
Calcium: 9.3 mg/dL (ref 8.7–10.3)
Chloride: 100 mmol/L (ref 96–106)
Creatinine, Ser: 1.43 mg/dL — ABNORMAL HIGH (ref 0.57–1.00)
Globulin, Total: 2.8 g/dL (ref 1.5–4.5)
Glucose: 107 mg/dL — ABNORMAL HIGH (ref 70–99)
Potassium: 4.7 mmol/L (ref 3.5–5.2)
Sodium: 139 mmol/L (ref 134–144)
Total Protein: 7.3 g/dL (ref 6.0–8.5)
eGFR: 35 mL/min/{1.73_m2} — ABNORMAL LOW (ref >59)

## 2022-10-10 LAB — TSH: TSH: 7.12 u[IU]/mL — ABNORMAL HIGH (ref 0.450–4.500)

## 2022-10-10 MED ORDER — LEVOTHYROXINE SODIUM 50 MCG PO TABS: 50.0000 ug | ORAL_TABLET | Freq: Every day | ORAL | 1 refills | Status: AC

## 2022-12-01 ENCOUNTER — Other Ambulatory Visit: Payer: Self-pay | Admitting: Family

## 2022-12-01 DIAGNOSIS — F1320 Sedative, hypnotic or anxiolytic dependence, uncomplicated: Secondary | ICD-10-CM

## 2022-12-01 DIAGNOSIS — F419 Anxiety disorder, unspecified: Secondary | ICD-10-CM

## 2022-12-01 DIAGNOSIS — Z79899 Other long term (current) drug therapy: Secondary | ICD-10-CM

## 2022-12-11 ENCOUNTER — Ambulatory Visit (INDEPENDENT_AMBULATORY_CARE_PROVIDER_SITE_OTHER): Payer: Medicare Other | Admitting: Family

## 2022-12-11 ENCOUNTER — Encounter: Payer: Self-pay | Admitting: Family

## 2022-12-11 VITALS — BP 149/79 | HR 89 | Temp 97.4°F | Ht 61.5 in | Wt 128.8 lb

## 2022-12-11 DIAGNOSIS — I129 Hypertensive chronic kidney disease with stage 1 through stage 4 chronic kidney disease, or unspecified chronic kidney disease: Secondary | ICD-10-CM | POA: Diagnosis not present

## 2022-12-11 DIAGNOSIS — E039 Hypothyroidism, unspecified: Secondary | ICD-10-CM | POA: Diagnosis not present

## 2022-12-11 DIAGNOSIS — I10 Essential (primary) hypertension: Secondary | ICD-10-CM

## 2022-12-11 DIAGNOSIS — N1832 Chronic kidney disease, stage 3b: Secondary | ICD-10-CM | POA: Diagnosis not present

## 2022-12-11 NOTE — Progress Notes (Signed)
Subjective:    Patient ID: Kelly Peters, female    DOB: February 09, 1930, 89 y.o.   MRN: [id number]  Chief Complaint  Patient presents with   Hypothyroidism    Did not start thyroid meds. Also o2 dropped to 87 when walking    Pt presents to the office today recheck TSH. She did not start the levothyroxine because she did not want medications.   She has CKD and limits NSAID"s.  Thyroid Problem Presents for follow-up visit. Patient reports no anxiety, constipation, diarrhea, dry skin or fatigue. The symptoms have been stable.  Hypertension This is a chronic problem. The current episode started more than 1 year ago. The problem has been waxing and waning since onset. The problem is uncontrolled. Associated symptoms include malaise/fatigue. Pertinent negatives include no peripheral edema or shortness of breath. Risk factors for coronary artery disease include dyslipidemia. The current treatment provides moderate improvement. Identifiable causes of hypertension include a thyroid problem.      Review of Systems  Constitutional:  Positive for malaise/fatigue. Negative for fatigue.  Respiratory:  Negative for shortness of breath.   Gastrointestinal:  Negative for constipation and diarrhea.  Psychiatric/Behavioral:  The patient is not nervous/anxious.   All other systems reviewed and are negative.      Objective:   Physical Exam Vitals reviewed.  Constitutional:      General: She is not in acute distress.    Appearance: She is well-developed.  HENT:     Head: Normocephalic and atraumatic.     Right Ear: Tympanic membrane normal.     Left Ear: Tympanic membrane normal.  Eyes:     Pupils: Pupils are equal, round, and reactive to light.  Neck:     Thyroid: No thyromegaly.  Cardiovascular:     Rate and Rhythm: Normal rate and regular rhythm.     Heart sounds: Normal heart sounds. No murmur heard. Pulmonary:     Effort: Pulmonary effort is normal. No respiratory distress.     Breath  sounds: Normal breath sounds. No wheezing.  Abdominal:     General: Bowel sounds are normal. There is no distension.     Palpations: Abdomen is soft.     Tenderness: There is no abdominal tenderness.  Musculoskeletal:        General: No tenderness. Normal range of motion.     Cervical back: Normal range of motion and neck supple.  Skin:    General: Skin is warm and dry.  Neurological:     Mental Status: She is alert and oriented to person, place, and time.     Cranial Nerves: No cranial nerve deficit.     Deep Tendon Reflexes: Reflexes are normal and symmetric.  Psychiatric:        Behavior: Behavior normal.        Thought Content: Thought content normal.        Judgment: Judgment normal.      BP (!) 149/79   Pulse 89   Temp (!) 97.4 F (36.3 C) (Temporal)   Ht 5' 1.5" (1.562 m)   Wt 128 lb 12.8 oz (58.4 kg)   SpO2 91% Comment: 89 walking  BMI 23.94 kg/m      Assessment & Plan:  HALLELUJAH LANGLITZ comes in today with chief complaint of Hypothyroidism (Did not start thyroid meds. Also o2 dropped to 87 when walking )   Diagnosis and orders addressed:  1. Hypothyroidism, unspecified type - CMP14+EGFR - TSH  2. Stage 3b chronic  kidney disease (HCC) - CMP14+EGFR  3. Essential hypertension - CMP14+EGFR   Labs pending Health Maintenance reviewed Diet and exercise encouraged  Follow up plan: 1 month for medication refill   Jannifer Rodney, FNP

## 2022-12-11 NOTE — Patient Instructions (Signed)

## 2022-12-12 LAB — CMP14+EGFR
ALT: 9 IU/L (ref 0–32)
AST: 18 IU/L (ref 0–40)
Albumin/Globulin Ratio: 1.8 (ref 1.2–2.2)
Albumin: 4.6 g/dL (ref 3.6–4.6)
Alkaline Phosphatase: 144 IU/L — ABNORMAL HIGH (ref 44–121)
BUN/Creatinine Ratio: 16 (ref 12–28)
BUN: 23 mg/dL (ref 10–36)
Bilirubin Total: 0.6 mg/dL (ref 0.0–1.2)
CO2: 21 mmol/L (ref 20–29)
Calcium: 9.2 mg/dL (ref 8.7–10.3)
Chloride: 100 mmol/L (ref 96–106)
Creatinine, Ser: 1.47 mg/dL — ABNORMAL HIGH (ref 0.57–1.00)
Globulin, Total: 2.5 g/dL (ref 1.5–4.5)
Glucose: 89 mg/dL (ref 70–99)
Potassium: 4.9 mmol/L (ref 3.5–5.2)
Sodium: 141 mmol/L (ref 134–144)
Total Protein: 7.1 g/dL (ref 6.0–8.5)
eGFR: 34 mL/min/{1.73_m2} — ABNORMAL LOW (ref >59)

## 2022-12-12 LAB — TSH: TSH: 6.33 u[IU]/mL — ABNORMAL HIGH (ref 0.450–4.500)

## 2022-12-31 ENCOUNTER — Other Ambulatory Visit: Payer: Self-pay | Admitting: Family

## 2022-12-31 ENCOUNTER — Telehealth: Payer: Self-pay | Admitting: Family

## 2022-12-31 DIAGNOSIS — Z79899 Other long term (current) drug therapy: Secondary | ICD-10-CM

## 2022-12-31 DIAGNOSIS — F419 Anxiety disorder, unspecified: Secondary | ICD-10-CM

## 2022-12-31 DIAGNOSIS — F1320 Sedative, hypnotic or anxiolytic dependence, uncomplicated: Secondary | ICD-10-CM

## 2023-01-01 MED ORDER — ALPRAZOLAM 0.5 MG PO TABS
0.5000 mg | ORAL_TABLET | Freq: Two times a day (BID) | ORAL | 2 refills | Status: DC | PRN
Start: 2023-01-01 — End: 2023-01-09

## 2023-01-01 NOTE — Telephone Encounter (Signed)
Patient aware and verbalized understanding. °

## 2023-01-01 NOTE — Telephone Encounter (Signed)
Prescription sent to pharmacy, needs to keep follow up visit.   Jannifer Rodney, FNP

## 2023-01-09 ENCOUNTER — Ambulatory Visit (INDEPENDENT_AMBULATORY_CARE_PROVIDER_SITE_OTHER): Payer: Medicare Other | Admitting: Family

## 2023-01-09 ENCOUNTER — Encounter: Payer: Self-pay | Admitting: Family

## 2023-01-09 VITALS — BP 138/74 | HR 89 | Temp 98.1°F | Ht 61.5 in | Wt 130.6 lb

## 2023-01-09 DIAGNOSIS — F339 Major depressive disorder, recurrent, unspecified: Secondary | ICD-10-CM

## 2023-01-09 DIAGNOSIS — N1832 Chronic kidney disease, stage 3b: Secondary | ICD-10-CM

## 2023-01-09 DIAGNOSIS — F419 Anxiety disorder, unspecified: Secondary | ICD-10-CM | POA: Diagnosis not present

## 2023-01-09 DIAGNOSIS — J449 Chronic obstructive pulmonary disease, unspecified: Secondary | ICD-10-CM

## 2023-01-09 DIAGNOSIS — K2100 Gastro-esophageal reflux disease with esophagitis, without bleeding: Secondary | ICD-10-CM | POA: Diagnosis not present

## 2023-01-09 DIAGNOSIS — E538 Deficiency of other specified B group vitamins: Secondary | ICD-10-CM

## 2023-01-09 DIAGNOSIS — F1320 Sedative, hypnotic or anxiolytic dependence, uncomplicated: Secondary | ICD-10-CM

## 2023-01-09 DIAGNOSIS — E039 Hypothyroidism, unspecified: Secondary | ICD-10-CM

## 2023-01-09 DIAGNOSIS — Z79899 Other long term (current) drug therapy: Secondary | ICD-10-CM | POA: Diagnosis not present

## 2023-01-09 DIAGNOSIS — I129 Hypertensive chronic kidney disease with stage 1 through stage 4 chronic kidney disease, or unspecified chronic kidney disease: Secondary | ICD-10-CM | POA: Diagnosis not present

## 2023-01-09 DIAGNOSIS — Z87891 Personal history of nicotine dependence: Secondary | ICD-10-CM | POA: Diagnosis not present

## 2023-01-09 DIAGNOSIS — I10 Essential (primary) hypertension: Secondary | ICD-10-CM

## 2023-01-09 MED ORDER — ALPRAZOLAM 0.5 MG PO TABS
0.5000 mg | ORAL_TABLET | Freq: Two times a day (BID) | ORAL | 2 refills | Status: DC | PRN
Start: 2023-01-09 — End: 2023-04-14

## 2023-01-09 NOTE — Patient Instructions (Signed)
Health Maintenance After Age 87 After age 87, you are at a higher risk for certain long-term diseases and infections as well as injuries from falls. Falls are a major cause of broken bones and head injuries in people who are older than age 87. Getting regular preventive care can help to keep you healthy and well. Preventive care includes getting regular testing and making lifestyle changes as recommended by your health care provider. Talk with your health care provider about: Which screenings and tests you should have. A screening is a test that checks for a disease when you have no symptoms. A diet and exercise plan that is right for you. What should I know about screenings and tests to prevent falls? Screening and testing are the best ways to find a health problem early. Early diagnosis and treatment give you the best chance of managing medical conditions that are common after age 87. Certain conditions and lifestyle choices may make you more likely to have a fall. Your health care provider may recommend: Regular vision checks. Poor vision and conditions such as cataracts can make you more likely to have a fall. If you wear glasses, make sure to get your prescription updated if your vision changes. Medicine review. Work with your health care provider to regularly review all of the medicines you are taking, including over-the-counter medicines. Ask your health care provider about any side effects that may make you more likely to have a fall. Tell your health care provider if any medicines that you take make you feel dizzy or sleepy. Strength and balance checks. Your health care provider may recommend certain tests to check your strength and balance while standing, walking, or changing positions. Foot health exam. Foot pain and numbness, as well as not wearing proper footwear, can make you more likely to have a fall. Screenings, including: Osteoporosis screening. Osteoporosis is a condition that causes  the bones to get weaker and break more easily. Blood pressure screening. Blood pressure changes and medicines to control blood pressure can make you feel dizzy. Depression screening. You may be more likely to have a fall if you have a fear of falling, feel depressed, or feel unable to do activities that you used to do. Alcohol use screening. Using too much alcohol can affect your balance and may make you more likely to have a fall. Follow these instructions at home: Lifestyle Do not drink alcohol if: Your health care provider tells you not to drink. If you drink alcohol: Limit how much you have to: 0-1 drink a day for women. 0-2 drinks a day for men. Know how much alcohol is in your drink. In the [place] one drink equals one 12 oz bottle of beer (355 mL), one 5 oz glass of wine (148 mL), or one 1 oz glass of hard liquor (44 mL). Do not use any products that contain nicotine or tobacco. These products include cigarettes, chewing tobacco, and vaping devices, such as e-cigarettes. If you need help quitting, ask your health care provider. Activity  Follow a regular exercise program to stay fit. This will help you maintain your balance. Ask your health care provider what types of exercise are appropriate for you. If you need a cane or walker, use it as recommended by your health care provider. Wear supportive shoes that have nonskid soles. Safety  Remove any tripping hazards, such as rugs, cords, and clutter. Install safety equipment such as grab bars in bathrooms and safety rails on stairs. Keep rooms and walkways   well-lit. General instructions Talk with your health care provider about your risks for falling. Tell your health care provider if: You fall. Be sure to tell your health care provider about all falls, even ones that seem minor. You feel dizzy, tiredness (fatigue), or off-balance. Take over-the-counter and prescription medicines only as told by your health care provider. These include  supplements. Eat a healthy diet and maintain a healthy weight. A healthy diet includes low-fat dairy products, low-fat (lean) meats, and fiber from whole grains, beans, and lots of fruits and vegetables. Stay current with your vaccines. Schedule regular health, dental, and eye exams. Summary Having a healthy lifestyle and getting preventive care can help to protect your health and wellness after age 87. Screening and testing are the best way to find a health problem early and help you avoid having a fall. Early diagnosis and treatment give you the best chance for managing medical conditions that are more common for people who are older than age 87. Falls are a major cause of broken bones and head injuries in people who are older than age 87. Take precautions to prevent a fall at home. Work with your health care provider to learn what changes you can make to improve your health and wellness and to prevent falls. This information is not intended to replace advice given to you by your health care provider. Make sure you discuss any questions you have with your health care provider. Document Revised: 11/26/2020 Document Reviewed: 11/26/2020 Elsevier Patient Education  2024 [employer]  

## 2023-01-09 NOTE — Addendum Note (Signed)
Addended by: Austin Miles F on: 01/09/2023 12:26 PM   Modules accepted: Orders

## 2023-01-09 NOTE — Progress Notes (Signed)
Subjective:    Patient ID: Kelly Peters, female    DOB: 01-26-1930, 89 y.o.   MRN: [id number]  Chief Complaint  Patient presents with   Medical Management of Chronic Issues   Pt presents to the office today for chronic follow up. She has COPD and states her breathing is "fine". She has an albuterol, but doesn't have to use it. Quit smoking 07/2018.    She did not start the levothyroxine because she did not want medications. Her TSH is mildly elevated.    She has CKD and limits NSAID"s.  Hypertension This is a chronic problem. The current episode started more than 1 year ago. The problem has been resolved since onset. The problem is controlled. Associated symptoms include anxiety and malaise/fatigue. Pertinent negatives include no peripheral edema or shortness of breath. Risk factors for coronary artery disease include dyslipidemia and sedentary lifestyle. The current treatment provides moderate improvement. Identifiable causes of hypertension include a thyroid problem.  Gastroesophageal Reflux She complains of belching, heartburn and a hoarse voice. This is a chronic problem. The current episode started more than 1 year ago. The problem occurs occasionally. Associated symptoms include fatigue. She has tried a PPI for the symptoms. The treatment provided moderate relief.  Thyroid Problem Symptoms include fatigue and hoarse voice. Patient reports no anxiety or dry skin. The symptoms have been stable.  Depression        This is a chronic problem.  The current episode started more than 1 year ago.   The problem occurs intermittently.  Associated symptoms include fatigue and insomnia.  Associated symptoms include no helplessness, no hopelessness and not sad.  Past medical history includes thyroid problem and anxiety.   Anxiety Presents for follow-up visit. Symptoms include excessive worry, insomnia and irritability. Patient reports no nervous/anxious behavior or shortness of breath. Symptoms  occur occasionally. The severity of symptoms is moderate.        Review of Systems  Constitutional:  Positive for fatigue, irritability and malaise/fatigue.  HENT:  Positive for hoarse voice.   Respiratory:  Negative for shortness of breath.   Gastrointestinal:  Positive for heartburn.  Psychiatric/Behavioral:  Positive for depression. The patient has insomnia. The patient is not nervous/anxious.   All other systems reviewed and are negative.      Objective:   Physical Exam Vitals reviewed.  Constitutional:      General: She is not in acute distress.    Appearance: She is well-developed.  HENT:     Head: Normocephalic and atraumatic.     Right Ear: Tympanic membrane normal.     Left Ear: Tympanic membrane normal.  Eyes:     Pupils: Pupils are equal, round, and reactive to light.  Neck:     Thyroid: No thyromegaly.  Cardiovascular:     Rate and Rhythm: Normal rate and regular rhythm.     Heart sounds: Normal heart sounds. No murmur heard. Pulmonary:     Effort: Pulmonary effort is normal. No respiratory distress.     Breath sounds: Normal breath sounds. No wheezing.  Abdominal:     General: Bowel sounds are normal. There is no distension.     Palpations: Abdomen is soft.     Tenderness: There is no abdominal tenderness.  Musculoskeletal:        General: No tenderness. Normal range of motion.     Cervical back: Normal range of motion and neck supple.  Skin:    General: Skin is warm and dry.  Neurological:     Mental Status: She is alert and oriented to person, place, and time.     Cranial Nerves: No cranial nerve deficit.     Deep Tendon Reflexes: Reflexes are normal and symmetric.  Psychiatric:        Behavior: Behavior normal.        Thought Content: Thought content normal.        Judgment: Judgment normal.       BP 138/74   Pulse 89   Temp 98.1 F (36.7 C) (Temporal)   Ht 5' 1.5" (1.562 m)   Wt 130 lb 9.6 oz (59.2 kg)   SpO2 93%   BMI 24.28 kg/m       Assessment & Plan:   Kelly THIBEAUX comes in today with chief complaint of Medical Management of Chronic Issues   Diagnosis and orders addressed:  1. Anxiety - ALPRAZolam (XANAX) 0.5 MG tablet; Take 1 tablet (0.5 mg total) by mouth 2 (two) times daily as needed for anxiety.  Dispense: 45 tablet; Refill: 2  2. Controlled substance agreement signed - ALPRAZolam (XANAX) 0.5 MG tablet; Take 1 tablet (0.5 mg total) by mouth 2 (two) times daily as needed for anxiety.  Dispense: 45 tablet; Refill: 2  3. Depression, recurrent (HCC)  4. Essential hypertension  5. Gastroesophageal reflux disease with esophagitis, unspecified whether hemorrhage   6. History of tobacco abus  7. Acquired hypothyroidism   8. Moderate COPD (chronic obstructive pulmonary disease) (HCC)  9. Stage 3b chronic kidney disease (HCC)  10. Vitamin B 12 deficiency  11. Benzodiazepine dependence (HCC) - ALPRAZolam (XANAX) 0.5 MG tablet; Take 1 tablet (0.5 mg total) by mouth 2 (two) times daily as needed for anxiety.  Dispense: 45 tablet; Refill: 2   Labs pending Patient reviewed in [place] controlled database, no flags noted. Contract and drug screen up dated today.  Health Maintenance reviewed Diet and exercise encouraged  Follow up plan: 3 months    Jannifer Rodney, FNP

## 2023-01-15 LAB — TOXASSURE SELECT 13 (MW), URINE

## 2023-02-11 ENCOUNTER — Ambulatory Visit (INDEPENDENT_AMBULATORY_CARE_PROVIDER_SITE_OTHER): Payer: Medicare Other | Admitting: Nurse Practitioner

## 2023-02-11 ENCOUNTER — Encounter: Payer: Self-pay | Admitting: Nurse Practitioner

## 2023-02-11 VITALS — BP 139/78 | HR 99 | Temp 98.2°F | Ht 61.5 in | Wt 128.6 lb

## 2023-02-11 DIAGNOSIS — N898 Other specified noninflammatory disorders of vagina: Secondary | ICD-10-CM | POA: Diagnosis not present

## 2023-02-11 DIAGNOSIS — B965 Pseudomonas (aeruginosa) (mallei) (pseudomallei) as the cause of diseases classified elsewhere: Secondary | ICD-10-CM | POA: Insufficient documentation

## 2023-02-11 DIAGNOSIS — N3080 Other cystitis without hematuria: Secondary | ICD-10-CM | POA: Diagnosis not present

## 2023-02-11 DIAGNOSIS — R399 Unspecified symptoms and signs involving the genitourinary system: Secondary | ICD-10-CM | POA: Insufficient documentation

## 2023-02-11 LAB — URINALYSIS, ROUTINE W REFLEX MICROSCOPIC
Bilirubin, UA: NEGATIVE
Glucose, UA: NEGATIVE
Ketones, UA: NEGATIVE
Nitrite, UA: NEGATIVE
Protein,UA: NEGATIVE
Specific Gravity, UA: <1.005 — ABNORMAL LOW (ref 1.005–1.030)
Urobilinogen, Ur: 1 mg/dL (ref 0.2–1.0)
pH, UA: 5.5 (ref 5.0–7.5)

## 2023-02-11 LAB — WET PREP FOR TRICH, YEAST, CLUE
Clue Cell Exam: NEGATIVE
Trichomonas Exam: NEGATIVE
Yeast Exam: NEGATIVE

## 2023-02-11 LAB — MICROSCOPIC EXAMINATION: Renal Epithel, UA: NONE SEEN /hpf

## 2023-02-11 MED ORDER — SULFAMETHOXAZOLE-TRIMETHOPRIM 800-160 MG PO TABS
1.0000 | ORAL_TABLET | Freq: Two times a day (BID) | ORAL | 0 refills | Status: DC
Start: 2023-02-11 — End: 2023-04-14

## 2023-02-11 MED ORDER — MONISTAT 1 COMBO PACK 1200 & 2 MG & % VA KIT
1.0000 | PACK | Freq: Once | VAGINAL | 0 refills | Status: AC
Start: 2023-02-11 — End: 2023-02-11

## 2023-02-11 NOTE — Progress Notes (Signed)
   Acute Office Visit  Subjective:     Patient ID: Kelly Peters, female    DOB: 1929-11-29, 89 y.o.   MRN: [id number]  Chief Complaint  Patient presents with   Dysuria    Burning when peeing for about a month   Vaginal Itching    Itching for about a month    HPI  Kelly Peters is a 89 y.o. female who complains of urinary frequency, urgency and dysuria x 33-month, without flank pain, fever, chills, or abnormal vaginal discharge or bleeding. C/o of vaginal dryness and have bee applying vaseline  Urinary Tract Infection: Patient complains of burning with urination, frequency, hesitancy, and urgency She has had symptoms for 1 month. Patient also complains of vaginal discharge. Patient denies back pain, fever, and sorethroat. Patient does have a history of recurrent UTI.  Patient does not have a history of pyelonephritis.   ROS Negative unless indicated in HPI    Objective:    BP 139/78   Pulse 99   Temp 98.2 F (36.8 C) (Temporal)   Ht 5' 1.5" (1.562 m)   Wt 128 lb 9.6 oz (58.3 kg)   SpO2 90%   BMI 23.91 kg/m  BP Readings from Last 3 Encounters:  02/11/23 139/78  01/09/23 138/74  12/11/22 (!) 149/79   Wt Readings from Last 3 Encounters:  02/11/23 128 lb 9.6 oz (58.3 kg)  01/09/23 130 lb 9.6 oz (59.2 kg)  12/11/22 128 lb 12.8 oz (58.4 kg)      Physical Exam  Appears well, in no apparent distress.  Vital signs are normal. The abdomen is soft without tenderness, guarding, mass, rebound or organomegaly. No CVA tenderness or inguinal adenopathy noted. Urine dipstick shows positive for WBC's and positive for RBC's.  Micro exam: 6-10 WBC's per HPF, 0-2 RBC's per HPF, few+ bacteria, epithelial cells, and yeast. WET prep:  Negative clue cells, trich, neg yeast Positive WBC, Few bacteria, few epithelial cells    Assessment & Plan:  UTI symptoms -     WET PREP FOR TRICH, YEAST, CLUE -     Urinalysis, Routine w reflex microscopic -     Sulfamethoxazole-Trimethoprim; Take 1  tablet by mouth 2 (two) times daily.  Dispense: 20 tablet; Refill: 0  Cystitis due to Pseudomonas -     Sulfamethoxazole-Trimethoprim; Take 1 tablet by mouth 2 (two) times daily.  Dispense: 20 tablet; Refill: 0  Vaginal dryness -     Monistat 1 Combo Pack; Place 1 each vaginally once for 1 dose.  Dispense: 1 each; Refill: 0  ASSESSMENT: UTI uncomplicated without evidence of pyelonephritis Vaginal dryness PLAN: Bactrim BID for 10 days  - increase hydration, may use Pyridium OTC prn.  - Vaginal dryness: Monistat vaginal cream Call or return to clinic prn if these symptoms worsen or fail to improve as anticipated.  Plan of care discussed with client, all questions answered  Return if symptoms worsen or fail to improve.  Arrie Aran Santa Lighter, DNP [facility] [address] [place], [place] [address] [phone]

## 2023-03-17 ENCOUNTER — Ambulatory Visit (INDEPENDENT_AMBULATORY_CARE_PROVIDER_SITE_OTHER): Payer: Medicare Other

## 2023-03-17 VITALS — Ht 61.0 in | Wt 127.0 lb

## 2023-03-17 DIAGNOSIS — Z0000 Encounter for general adult medical examination without abnormal findings: Secondary | ICD-10-CM | POA: Diagnosis not present

## 2023-03-17 NOTE — Patient Instructions (Signed)
Ms. Hollinger , Thank you for taking time to come for your Medicare Wellness Visit. I appreciate your ongoing commitment to your health goals. Please review the following plan we discussed and let me know if I can assist you in the future.   Referrals/Orders/Follow-Ups/Clinician Recommendations: Aim for 30 minutes of exercise or brisk walking, 6-8 glasses of water, and 5 servings of fruits and vegetables each day.   This is a list of the screening recommended for you and due dates:  Health Maintenance  Topic Date Due   Zoster (Shingles) Vaccine (1 of 2) Never done   DEXA scan (bone density measurement)  Never done   Flu Shot  02/19/2023   Pneumonia Vaccine (1 of 2 - PCV) 07/09/2023*   Medicare Annual Wellness Visit  03/16/2024   DTaP/Tdap/Td vaccine (2 - Td or Tdap) 07/01/2030   HPV Vaccine  Aged Out   COVID-19 Vaccine  Discontinued  *Topic was postponed. The date shown is not the original due date.    Advanced directives: (Copy Requested) Please bring a copy of your health care power of attorney and living will to the office to be added to your chart at your convenience.  Next Medicare Annual Wellness Visit scheduled for next year: Yes  Insert Preventive Care attachment Insert FALL PREVENTION attachment if needed

## 2023-03-17 NOTE — Progress Notes (Signed)
Subjective:   Kelly Peters is a 89 y.o. female who presents for Medicare Annual (Subsequent) preventive examination.  Visit Complete: Virtual  I connected with  Kelly Peters on 03/17/23 by a audio enabled telemedicine application and verified that I am speaking with the correct person using two identifiers.  Patient Location: Home  Provider Location: Home Office  I discussed the limitations of evaluation and management by telemedicine. The patient expressed understanding and agreed to proceed.  Patient Medicare AWV questionnaire was completed by the patient on 03/17/2023; I have confirmed that all information answered by patient is correct and no changes since this date.  Review of Systems    Vital Signs: Unable to obtain new vitals due to this being a telehealth visit.  Cardiac Risk Factors include: advanced age (>28men, >8 women);dyslipidemia     Objective:    Today's Vitals   03/17/23 0826  Weight: 127 lb (57.6 kg)  Height: 5\' 1"  (1.549 m)   Body mass index is 24 kg/m.     03/17/2023    8:29 AM 02/26/2022   11:29 AM 12/19/2020    8:29 AM 07/01/2020    2:12 PM 07/01/2020   11:18 AM 12/15/2019    8:52 AM 12/14/2018    8:47 AM  Advanced Directives  Does Patient Have a Medical Advance Directive? Yes Yes Yes Yes Yes Yes Yes  Type of Estate agent of [place];Living will Healthcare Power of [place];Living will Living will Living will Living will Healthcare Power of [place];Living will Living will  Does patient want to make changes to medical advance directive?    No - Patient declined  No - Patient declined No - Patient declined  Copy of Healthcare Power of Attorney in Chart? No - copy requested No - copy requested    No - copy requested   Would patient like information on creating a medical advance directive?      No - Patient declined     Current Medications (verified) Outpatient Encounter Medications as of 03/17/2023  Medication Sig    albuterol (VENTOLIN HFA) 108 (90 Base) MCG/ACT inhaler Inhale 2 puffs into the lungs QID.   ALPRAZolam (XANAX) 0.5 MG tablet Take 1 tablet (0.5 mg total) by mouth 2 (two) times daily as needed for anxiety.   amLODipine (NORVASC) 5 MG tablet TAKE ONE (1) TABLET BY MOUTH EVERY DAY   aspirin EC 81 MG tablet Take 81 mg by mouth daily.   citalopram (CELEXA) 20 MG tablet Take 1 tablet (20 mg total) by mouth daily.   clobetasol cream (TEMOVATE) 0.05 % Apply 1 Application topically 2 (two) times daily.   levothyroxine (SYNTHROID) 50 MCG tablet Take 1 tablet (50 mcg total) by mouth daily.   omeprazole (PRILOSEC) 20 MG capsule Take 1 capsule (20 mg total) by mouth daily.   sulfamethoxazole-trimethoprim (BACTRIM DS) 800-160 MG tablet Take 1 tablet by mouth 2 (two) times daily. (Patient not taking: Reported on 03/17/2023)   No facility-administered encounter medications on file as of 03/17/2023.    Allergies (verified) Penicillins   History: Past Medical History:  Diagnosis Date   Allergy    Anxiety    COPD (chronic obstructive pulmonary disease) (HCC)    Depression    Hypertension    Past Surgical History:  Procedure Laterality Date   ABDOMINAL HYSTERECTOMY     CATARACT EXTRACTION W/PHACO Left 10/03/2016   Procedure: CATARACT EXTRACTION PHACO AND INTRAOCULAR LENS PLACEMENT LEFT EYE CDE - 25.42;  Surgeon: Fabio Pierce,  MD;  Location: [facility];  Service: Ophthalmology;  Laterality: Left;  left   CATARACT EXTRACTION W/PHACO Right 10/24/2016   Procedure: CATARACT EXTRACTION PHACO AND INTRAOCULAR LENS PLACEMENT RIGHT EYE CDE= 24.99;  Surgeon: Fabio Pierce, MD;  Location: [facility];  Service: Ophthalmology;  Laterality: Right;  right   Family History  Problem Relation Age of Onset   Stroke Mother    Heart attack Father    Heart attack Brother    Diabetes Sister    Stroke Sister    COPD Daughter    Hypertension Daughter    Heart disease Son    Hypertension Son    Cancer Brother        kidney  cancer   Stroke Brother    COPD Brother    Diabetes Sister    Thyroid disease Daughter    Social History   Socioeconomic History   Marital status: Widowed    Spouse name: Not on file   Number of children: 3   Years of education: 7   Highest education level: 7th grade  Occupational History   Occupation: Retired  Tobacco Use   Smoking status: Former    Current packs/day: 0.00    Average packs/day: 0.5 packs/day for 58.0 years (29.0 ttl pk-yrs)    Types: Cigarettes    Start date: 07/23/1960    Quit date: 07/23/2018    Years since quitting: 4.6   Smokeless tobacco: Never   Tobacco comments:    down to 4-5 cigarettes a day 05/12/18  Vaping Use   Vaping status: Never Used  Substance and Sexual Activity   Alcohol use: Not Currently   Drug use: Never   Sexual activity: Not Currently    Birth control/protection: Surgical  Other Topics Concern   Not on file  Social History Narrative   Daughter lives next door   2 of her children are here daily   One of them comes 2-3 times per week   Social Determinants of Health   Financial Resource Strain: Low Risk  (03/17/2023)   Overall Financial Resource Strain (CARDIA)    Difficulty of Paying Living Expenses: Not hard at all  Food Insecurity: No Food Insecurity (03/17/2023)   Hunger Vital Sign    Worried About Running Out of Food in the Last Year: Never true    Ran Out of Food in the Last Year: Never true  Transportation Needs: No Transportation Needs (03/17/2023)   PRAPARE - Administrator, Civil Service (Medical): No    Lack of Transportation (Non-Medical): No  Physical Activity: Insufficiently Active (03/17/2023)   Exercise Vital Sign    Days of Exercise per Week: 3 days    Minutes of Exercise per Session: 30 min  Stress: No Stress Concern Present (03/17/2023)   Kelly Peters of Occupational Health - Occupational Stress Questionnaire    Feeling of Stress : Not at all  Social Connections: Moderately Isolated  (03/17/2023)   Social Connection and Isolation Panel [NHANES]    Frequency of Communication with Friends and Family: More than three times a week    Frequency of Social Gatherings with Friends and Family: More than three times a week    Attends Religious Services: 1 to 4 times per year    Active Member of [employer] or Organizations: No    Attends Banker Meetings: Never    Marital Status: Widowed    Tobacco Counseling Counseling given: Not Answered Tobacco comments: down to 4-5 cigarettes a day 05/12/18  Clinical Intake:  Pre-visit preparation completed: Yes  Pain : No/denies pain     Nutritional Risks: None Diabetes: No  How often do you need to have someone help you when you read instructions, pamphlets, or other written materials from your doctor or pharmacy?: 1 - Never  Interpreter Needed?: No  Information entered by :: Renie Ora, LPN   Activities of Daily Living    03/17/2023    8:30 AM  In your present state of health, do you have any difficulty performing the following activities:  Hearing? 0  Vision? 0  Difficulty concentrating or making decisions? 0  Walking or climbing stairs? 0  Dressing or bathing? 0  Doing errands, shopping? 0  Preparing Food and eating ? N  Using the Toilet? N  In the past six months, have you accidently leaked urine? N  Do you have problems with loss of bowel control? N  Managing your Medications? N  Managing your Finances? N  Housekeeping or managing your Housekeeping? N    Patient Care Team: Junie Spencer, FNP as PCP - General (Family Medicine)  Indicate any recent Medical Services you may have received from other than [facility] providers in the past year (date may be approximate).     Assessment:   This is a routine wellness examination for Elia.  Hearing/Vision screen Vision Screening - Comments:: Wears rx glasses - up to date with routine eye exams with  Dr.Le   Dietary issues and exercise activities  discussed:     Goals Addressed             This Visit's Progress    Patient Stated   On track    Stay healthy and active        Depression Screen    03/17/2023    8:28 AM 01/09/2023   11:27 AM 08/11/2022   11:28 AM 04/04/2022   11:11 AM 02/26/2022   11:26 AM 10/01/2021   11:03 AM 03/13/2021   11:29 AM  PHQ 2/9 Scores  PHQ - 2 Score 0 0 0 0 0 0 0  PHQ- 9 Score  0 0 0  0     Fall Risk    03/17/2023    8:27 AM 01/09/2023   11:27 AM 08/11/2022   11:28 AM 04/04/2022   11:11 AM 02/26/2022   11:20 AM  Fall Risk   Falls in the past year? 0 0 0 1 0  Number falls in past yr: 0 0   0  Injury with Fall? 0 0   0  Risk for fall due to : No Fall Risks Orthopedic patient   Mental status change  Follow up Falls prevention discussed Falls evaluation completed;Education provided   Falls prevention discussed;Education provided    MEDICARE RISK AT HOME: Medicare Risk at Home Any stairs in or around the home?: Yes If so, are there any without handrails?: No Home free of loose throw rugs in walkways, pet beds, electrical cords, etc?: Yes Adequate lighting in your home to reduce risk of falls?: Yes Life alert?: No Use of a cane, walker or w/c?: No Grab bars in the bathroom?: Yes Shower chair or bench in shower?: Yes Elevated toilet seat or a handicapped toilet?: Yes  TIMED UP AND GO:  Was the test performed?  No    Cognitive Function:        03/17/2023    8:30 AM 02/26/2022   11:31 AM 12/19/2020    8:32 AM 12/15/2019  8:58 AM 12/14/2018    8:54 AM  6CIT Screen  What Year? 0 points 0 points 0 points 0 points 0 points  What month? 0 points 0 points 0 points 0 points 0 points  What time? 0 points 0 points 0 points 0 points 0 points  Count back from 20 0 points 0 points 2 points 0 points 2 points  Months in reverse 0 points 0 points 2 points 0 points 0 points  Repeat phrase 0 points 0 points 0 points 2 points 2 points  Total Score 0 points 0 points 4 points 2 points 4 points     Immunizations Immunization History  Administered Date(s) Administered   Influenza, High Dose Seasonal PF 04/21/2018   Tdap 07/01/2020    TDAP status: Up to date  Flu Vaccine status: Declined, Education has been provided regarding the importance of this vaccine but patient still declined. Advised may receive this vaccine at local pharmacy or Health Dept. Aware to provide a copy of the vaccination record if obtained from local pharmacy or Health Dept. Verbalized acceptance and understanding.  Pneumococcal vaccine status: Declined,  Education has been provided regarding the importance of this vaccine but patient still declined. Advised may receive this vaccine at local pharmacy or Health Dept. Aware to provide a copy of the vaccination record if obtained from local pharmacy or Health Dept. Verbalized acceptance and understanding.   Covid-19 vaccine status: Declined, Education has been provided regarding the importance of this vaccine but patient still declined. Advised may receive this vaccine at local pharmacy or Health Dept.or vaccine clinic. Aware to provide a copy of the vaccination record if obtained from local pharmacy or Health Dept. Verbalized acceptance and understanding.  Qualifies for Shingles Vaccine? Yes   Zostavax completed No   Shingrix Completed?: No.    Education has been provided regarding the importance of this vaccine. Patient has been advised to call insurance company to determine out of pocket expense if they have not yet received this vaccine. Advised may also receive vaccine at local pharmacy or Health Dept. Verbalized acceptance and understanding.  Screening Tests Health Maintenance  Topic Date Due   Zoster Vaccines- Shingrix (1 of 2) Never done   DEXA SCAN  Never done   INFLUENZA VACCINE  02/19/2023   Pneumonia Vaccine 28+ Years old (1 of 2 - PCV) 07/09/2023 (Originally 01/29/1936)   Medicare Annual Wellness (AWV)  03/16/2024   DTaP/Tdap/Td (2 - Td or Tdap)  07/01/2030   HPV VACCINES  Aged Out   COVID-19 Vaccine  Discontinued    Health Maintenance  Health Maintenance Due  Topic Date Due   Zoster Vaccines- Shingrix (1 of 2) Never done   DEXA SCAN  Never done   INFLUENZA VACCINE  02/19/2023    Colorectal cancer screening: No longer required.   Mammogram status: No longer required due to age.  Bone Density status: Ordered declined . Pt provided with contact info and advised to call to schedule appt.  Lung Cancer Screening: (Low Dose CT Chest recommended if Age 43-80 years, 20 pack-year currently smoking OR have quit w/in 15years.) does not qualify.   Lung Cancer Screening Referral: n/a  Additional Screening:  Hepatitis C Screening: does not qualify;   Vision Screening: Recommended annual ophthalmology exams for early detection of glaucoma and other disorders of the eye. Is the patient up to date with their annual eye exam?  Yes  Who is the provider or what is the name of the office in which  the patient attends annual eye exams? Dr.Le  If pt is not established with a provider, would they like to be referred to a provider to establish care? No .   Dental Screening: Recommended annual dental exams for proper oral hygiene    Community Resource Referral / Chronic Care Management: CRR required this visit?  No   CCM required this visit?  No     Plan:     I have personally reviewed and noted the following in the patient's chart:   Medical and social history Use of alcohol, tobacco or illicit drugs  Current medications and supplements including opioid prescriptions. Patient is not currently taking opioid prescriptions. Functional ability and status Nutritional status Physical activity Advanced directives List of other physicians Hospitalizations, surgeries, and ER visits in previous 12 months Vitals Screenings to include cognitive, depression, and falls Referrals and appointments  In addition, I have reviewed and  discussed with patient certain preventive protocols, quality metrics, and best practice recommendations. A written personalized care plan for preventive services as well as general preventive health recommendations were provided to patient.     Lorrene Reid, LPN   1/61/0960   After Visit Summary: (MyChart) Due to this being a telephonic visit, the after visit summary with patients personalized plan was offered to patient via MyChart   Nurse Notes: none

## 2023-04-14 ENCOUNTER — Encounter: Payer: Self-pay | Admitting: Family

## 2023-04-14 ENCOUNTER — Ambulatory Visit (INDEPENDENT_AMBULATORY_CARE_PROVIDER_SITE_OTHER): Payer: Medicare Other | Admitting: Family

## 2023-04-14 VITALS — BP 134/75 | HR 81 | Temp 97.8°F | Ht 61.0 in | Wt 131.2 lb

## 2023-04-14 DIAGNOSIS — Z79899 Other long term (current) drug therapy: Secondary | ICD-10-CM

## 2023-04-14 DIAGNOSIS — R300 Dysuria: Secondary | ICD-10-CM | POA: Diagnosis not present

## 2023-04-14 DIAGNOSIS — K2100 Gastro-esophageal reflux disease with esophagitis, without bleeding: Secondary | ICD-10-CM

## 2023-04-14 DIAGNOSIS — I10 Essential (primary) hypertension: Secondary | ICD-10-CM

## 2023-04-14 DIAGNOSIS — F419 Anxiety disorder, unspecified: Secondary | ICD-10-CM | POA: Diagnosis not present

## 2023-04-14 DIAGNOSIS — N1832 Chronic kidney disease, stage 3b: Secondary | ICD-10-CM

## 2023-04-14 DIAGNOSIS — F1320 Sedative, hypnotic or anxiolytic dependence, uncomplicated: Secondary | ICD-10-CM | POA: Diagnosis not present

## 2023-04-14 DIAGNOSIS — J449 Chronic obstructive pulmonary disease, unspecified: Secondary | ICD-10-CM | POA: Diagnosis not present

## 2023-04-14 DIAGNOSIS — F339 Major depressive disorder, recurrent, unspecified: Secondary | ICD-10-CM

## 2023-04-14 DIAGNOSIS — E039 Hypothyroidism, unspecified: Secondary | ICD-10-CM

## 2023-04-14 LAB — MICROSCOPIC EXAMINATION
Epithelial Cells (non renal): NONE SEEN /hpf (ref 0–10)
RBC, Urine: NONE SEEN /hpf (ref 0–2)
Renal Epithel, UA: NONE SEEN /hpf
WBC, UA: NONE SEEN /hpf (ref 0–5)

## 2023-04-14 LAB — URINALYSIS, COMPLETE
Bilirubin, UA: NEGATIVE
Glucose, UA: NEGATIVE
Ketones, UA: NEGATIVE
Nitrite, UA: NEGATIVE
Protein,UA: NEGATIVE
RBC, UA: NEGATIVE
Specific Gravity, UA: 1.015 (ref 1.005–1.030)
Urobilinogen, Ur: 1 mg/dL (ref 0.2–1.0)
pH, UA: 7 (ref 5.0–7.5)

## 2023-04-14 MED ORDER — ALPRAZOLAM 0.5 MG PO TABS
0.5000 mg | ORAL_TABLET | Freq: Two times a day (BID) | ORAL | 2 refills | Status: DC | PRN
Start: 2023-04-14 — End: 2023-09-15

## 2023-04-14 NOTE — Progress Notes (Signed)
Subjective:    Patient ID: Kelly Peters, female    DOB: [birth year], 89 y.o.   MRN: [id number]  Chief Complaint  Patient presents with   Medical Management of Chronic Issues   Pt presents to the office today for chronic follow up. She has COPD and states her breathing is "fine". She has an albuterol, but doesn't have to use it. Quit smoking 07/2018.     She did not start the levothyroxine because she did not want medications. Her TSH is mildly elevated.    She has CKD and limits NSAID"s.  Hypertension This is a chronic problem. The current episode started more than 1 year ago. The problem has been waxing and waning since onset. The problem is controlled. Associated symptoms include anxiety. Pertinent negatives include no malaise/fatigue, peripheral edema or shortness of breath. Risk factors for coronary artery disease include dyslipidemia and sedentary lifestyle. The current treatment provides moderate improvement. Identifiable causes of hypertension include a thyroid problem.  Gastroesophageal Reflux She complains of belching, heartburn and a hoarse voice. She reports no nausea. The current episode started more than 1 year ago. The problem occurs occasionally. Pertinent negatives include no fatigue. She has tried a PPI for the symptoms. The treatment provided mild relief.  Thyroid Problem Presents for follow-up visit. Symptoms include anxiety and hoarse voice. Patient reports no cold intolerance, constipation, depressed mood or fatigue. The symptoms have been stable.  Depression        This is a chronic problem.  The current episode started more than 1 year ago.   The onset quality is gradual.   The problem occurs intermittently.  Associated symptoms include no fatigue, no helplessness, no hopelessness and not sad.  Past treatments include SSRIs - Selective serotonin reuptake inhibitors.  Past medical history includes thyroid problem and anxiety.   Anxiety Presents for follow-up visit.  Symptoms include excessive worry and nervous/anxious behavior. Patient reports no depressed mood, nausea or shortness of breath. Symptoms occur occasionally. The severity of symptoms is moderate.    Dysuria  This is a new problem. The current episode started 1 to 4 weeks ago. The problem occurs intermittently. The problem has been waxing and waning. The quality of the pain is described as burning. The pain is at a severity of 2/10. The pain is mild. Associated symptoms include frequency, hesitancy and urgency. Pertinent negatives include no flank pain, hematuria, nausea or vomiting. She has tried increased fluids for the symptoms. The treatment provided mild relief.      Review of Systems  Constitutional:  Negative for fatigue and malaise/fatigue.  HENT:  Positive for hoarse voice.   Respiratory:  Negative for shortness of breath.   Gastrointestinal:  Positive for heartburn. Negative for constipation, nausea and vomiting.  Endocrine: Negative for cold intolerance.  Genitourinary:  Positive for dysuria, frequency, hesitancy and urgency. Negative for flank pain and hematuria.  Psychiatric/Behavioral:  Positive for depression. The patient is nervous/anxious.   All other systems reviewed and are negative.      Objective:   Physical Exam Vitals reviewed.  Constitutional:      General: She is not in acute distress.    Appearance: She is well-developed.  HENT:     Head: Normocephalic and atraumatic.     Right Ear: Tympanic membrane normal.     Left Ear: Tympanic membrane normal.  Eyes:     Pupils: Pupils are equal, round, and reactive to light.  Neck:     Thyroid: No thyromegaly.  Cardiovascular:     Rate and Rhythm: Normal rate and regular rhythm.     Heart sounds: Normal heart sounds. No murmur heard. Pulmonary:     Effort: Pulmonary effort is normal. No respiratory distress.     Breath sounds: Normal breath sounds. No wheezing.  Abdominal:     General: Bowel sounds are normal.  There is no distension.     Palpations: Abdomen is soft.     Tenderness: There is no abdominal tenderness.  Musculoskeletal:        General: No tenderness. Normal range of motion.     Cervical back: Normal range of motion and neck supple.  Skin:    General: Skin is warm and dry.  Neurological:     Mental Status: She is alert and oriented to person, place, and time.     Cranial Nerves: No cranial nerve deficit.     Deep Tendon Reflexes: Reflexes are normal and symmetric.  Psychiatric:        Behavior: Behavior normal.        Thought Content: Thought content normal.        Judgment: Judgment normal.     BP 134/75   Pulse 81   Temp 97.8 F (36.6 C) (Temporal)   Ht 5\' 1"  (1.549 m)   Wt 131 lb 3.2 oz (59.5 kg)   SpO2 90%   BMI 24.79 kg/m        Assessment & Plan:  Kelly Peters comes in today with chief complaint of Medical Management of Chronic Issues   Diagnosis and orders addressed:  1. Anxiety - ALPRAZolam (XANAX) 0.5 MG tablet; Take 1 tablet (0.5 mg total) by mouth 2 (two) times daily as needed for anxiety.  Dispense: 45 tablet; Refill: 2 - CMP14+EGFR  2. Controlled substance agreement signed - ALPRAZolam (XANAX) 0.5 MG tablet; Take 1 tablet (0.5 mg total) by mouth 2 (two) times daily as needed for anxiety.  Dispense: 45 tablet; Refill: 2 - CMP14+EGFR  3. Benzodiazepine dependence (HCC) - ALPRAZolam (XANAX) 0.5 MG tablet; Take 1 tablet (0.5 mg total) by mouth 2 (two) times daily as needed for anxiety.  Dispense: 45 tablet; Refill: 2 - CMP14+EGFR  4. Depression, recurrent (HCC) - CMP14+EGFR  5. Essential hypertension - CMP14+EGFR  6. Gastroesophageal reflux disease with esophagitis, unspecified whether hemorrhage - CMP14+EGFR  7. Acquired hypothyroidism - CMP14+EGFR - TSH  8. Moderate COPD (chronic obstructive pulmonary disease) (HCC) - CMP14+EGFR  9. Stage 3b chronic kidney disease (HCC) - CMP14+EGFR  10. Dysuria - Urinalysis, Complete - Urine  Culture   Labs pending Patient reviewed in [place] controlled database, no flags noted. Contract and drug screen are up to date.  Health Maintenance reviewed Diet and exercise encouraged  Follow up plan: 3 months   Jannifer Rodney, FNP

## 2023-04-15 LAB — CMP14+EGFR
ALT: 6 IU/L (ref 0–32)
AST: 16 IU/L (ref 0–40)
Albumin: 4.5 g/dL (ref 3.6–4.6)
Alkaline Phosphatase: 126 IU/L — ABNORMAL HIGH (ref 44–121)
BUN/Creatinine Ratio: 12 (ref 12–28)
BUN: 17 mg/dL (ref 10–36)
Bilirubin Total: 0.5 mg/dL (ref 0.0–1.2)
CO2: 25 mmol/L (ref 20–29)
Calcium: 9 mg/dL (ref 8.7–10.3)
Chloride: 101 mmol/L (ref 96–106)
Creatinine, Ser: 1.37 mg/dL — ABNORMAL HIGH (ref 0.57–1.00)
Globulin, Total: 2.5 g/dL (ref 1.5–4.5)
Glucose: 104 mg/dL — ABNORMAL HIGH (ref 70–99)
Potassium: 4.9 mmol/L (ref 3.5–5.2)
Sodium: 140 mmol/L (ref 134–144)
Total Protein: 7 g/dL (ref 6.0–8.5)
eGFR: 36 mL/min/{1.73_m2} — ABNORMAL LOW (ref >59)

## 2023-04-15 LAB — TSH: TSH: 6.03 u[IU]/mL — ABNORMAL HIGH (ref 0.450–4.500)

## 2023-04-16 LAB — URINE CULTURE

## 2023-06-24 ENCOUNTER — Telehealth: Payer: Self-pay | Admitting: Family

## 2023-06-24 DIAGNOSIS — K219 Gastro-esophageal reflux disease without esophagitis: Secondary | ICD-10-CM

## 2023-06-24 NOTE — Telephone Encounter (Signed)
  Prescription Request  06/24/2023  Is this a "Controlled Substance" medicine? yes  Have you seen your PCP in the last 2 weeks? no  If YES, route message to pool  -  If NO, patient needs to be scheduled for appointment.  What is the name of the medication or equipment? Omeprazole 20 mg, alprazolam 0.5 mg. Patient has appt January 3rd with Christy.  Have you contacted your pharmacy to request a refill? no   Which pharmacy would you like this sent to? [facility].   Patient notified that their request is being sent to the clinical staff for review and that they should receive a response within 2 business days.

## 2023-06-24 NOTE — Telephone Encounter (Signed)
Pt will check with [facility], this was filled in March with 11 refills, there should be refills on file.

## 2023-07-24 ENCOUNTER — Ambulatory Visit: Payer: Medicare Other | Admitting: Family

## 2023-07-29 ENCOUNTER — Other Ambulatory Visit: Payer: Self-pay | Admitting: Family

## 2023-07-29 DIAGNOSIS — I10 Essential (primary) hypertension: Secondary | ICD-10-CM

## 2023-08-20 ENCOUNTER — Encounter: Payer: Medicare Other | Admitting: Family

## 2023-08-28 ENCOUNTER — Other Ambulatory Visit: Payer: Self-pay | Admitting: Family

## 2023-08-28 DIAGNOSIS — F1320 Sedative, hypnotic or anxiolytic dependence, uncomplicated: Secondary | ICD-10-CM

## 2023-08-28 DIAGNOSIS — F419 Anxiety disorder, unspecified: Secondary | ICD-10-CM

## 2023-08-28 DIAGNOSIS — Z79899 Other long term (current) drug therapy: Secondary | ICD-10-CM

## 2023-09-01 ENCOUNTER — Encounter: Payer: Medicare Other | Admitting: Family

## 2023-09-15 ENCOUNTER — Encounter: Payer: Self-pay | Admitting: Family

## 2023-09-15 ENCOUNTER — Ambulatory Visit: Payer: Medicare Other | Admitting: Family

## 2023-09-15 ENCOUNTER — Ambulatory Visit (INDEPENDENT_AMBULATORY_CARE_PROVIDER_SITE_OTHER): Payer: Medicare Other

## 2023-09-15 VITALS — BP 146/77 | HR 95 | Temp 97.9°F | Wt 133.2 lb

## 2023-09-15 DIAGNOSIS — E039 Hypothyroidism, unspecified: Secondary | ICD-10-CM

## 2023-09-15 DIAGNOSIS — N1832 Chronic kidney disease, stage 3b: Secondary | ICD-10-CM | POA: Diagnosis not present

## 2023-09-15 DIAGNOSIS — L209 Atopic dermatitis, unspecified: Secondary | ICD-10-CM

## 2023-09-15 DIAGNOSIS — J449 Chronic obstructive pulmonary disease, unspecified: Secondary | ICD-10-CM | POA: Diagnosis not present

## 2023-09-15 DIAGNOSIS — Z79899 Other long term (current) drug therapy: Secondary | ICD-10-CM

## 2023-09-15 DIAGNOSIS — F419 Anxiety disorder, unspecified: Secondary | ICD-10-CM | POA: Diagnosis not present

## 2023-09-15 DIAGNOSIS — K219 Gastro-esophageal reflux disease without esophagitis: Secondary | ICD-10-CM | POA: Diagnosis not present

## 2023-09-15 DIAGNOSIS — Z0001 Encounter for general adult medical examination with abnormal findings: Secondary | ICD-10-CM | POA: Diagnosis not present

## 2023-09-15 DIAGNOSIS — F1320 Sedative, hypnotic or anxiolytic dependence, uncomplicated: Secondary | ICD-10-CM | POA: Diagnosis not present

## 2023-09-15 DIAGNOSIS — F339 Major depressive disorder, recurrent, unspecified: Secondary | ICD-10-CM

## 2023-09-15 DIAGNOSIS — Z0000 Encounter for general adult medical examination without abnormal findings: Secondary | ICD-10-CM | POA: Diagnosis not present

## 2023-09-15 DIAGNOSIS — E538 Deficiency of other specified B group vitamins: Secondary | ICD-10-CM

## 2023-09-15 DIAGNOSIS — J432 Centrilobular emphysema: Secondary | ICD-10-CM | POA: Diagnosis not present

## 2023-09-15 DIAGNOSIS — R7981 Abnormal blood-gas level: Secondary | ICD-10-CM | POA: Diagnosis not present

## 2023-09-15 DIAGNOSIS — J439 Emphysema, unspecified: Secondary | ICD-10-CM | POA: Diagnosis not present

## 2023-09-15 DIAGNOSIS — J479 Bronchiectasis, uncomplicated: Secondary | ICD-10-CM | POA: Diagnosis not present

## 2023-09-15 DIAGNOSIS — K2100 Gastro-esophageal reflux disease with esophagitis, without bleeding: Secondary | ICD-10-CM

## 2023-09-15 DIAGNOSIS — I10 Essential (primary) hypertension: Secondary | ICD-10-CM

## 2023-09-15 MED ORDER — ALPRAZOLAM 0.5 MG PO TABS
0.5000 mg | ORAL_TABLET | Freq: Two times a day (BID) | ORAL | 2 refills | Status: DC | PRN
Start: 2023-09-15 — End: 2023-12-17

## 2023-09-15 MED ORDER — ALBUTEROL SULFATE HFA 108 (90 BASE) MCG/ACT IN AERS: 2.0000 | INHALATION_SPRAY | Freq: Four times a day (QID) | RESPIRATORY_TRACT | 2 refills | Status: AC

## 2023-09-15 MED ORDER — AMLODIPINE BESYLATE 5 MG PO TABS
5.0000 mg | ORAL_TABLET | Freq: Every day | ORAL | 1 refills | Status: DC
Start: 2023-09-15 — End: 2024-01-28

## 2023-09-15 MED ORDER — CLOBETASOL PROPIONATE 0.05 % EX CREA: 1.0000 | TOPICAL_CREAM | Freq: Two times a day (BID) | CUTANEOUS | 2 refills | Status: AC

## 2023-09-15 MED ORDER — OMEPRAZOLE 20 MG PO CPDR
20.0000 mg | DELAYED_RELEASE_CAPSULE | Freq: Every day | ORAL | 11 refills | Status: AC
Start: 2023-09-15 — End: ?

## 2023-09-15 NOTE — Progress Notes (Signed)
 Subjective:    Patient ID: Kelly Peters, female    DOB: [birth year], 89 y.o.   MRN: [id number]  Chief Complaint  Patient presents with   Medical Management of Chronic Issues    DECLINES VACCINES TODAY    Pt presents to the office today for CPE and chronic follow up. She has COPD and states her breathing is "fine". She has an albuterol, but doesn't have to use it. Quit smoking 07/2018.     She did not start the levothyroxine because she did not want medications. Her TSH is mildly elevated.    She has CKD and limits NSAID"s.  Hypertension This is a chronic problem. The current episode started more than 1 year ago. The problem has been waxing and waning since onset. The problem is controlled. Associated symptoms include anxiety and malaise/fatigue. Pertinent negatives include no peripheral edema or shortness of breath. Risk factors for coronary artery disease include dyslipidemia and sedentary lifestyle. The current treatment provides moderate improvement. Identifiable causes of hypertension include a thyroid problem.  Gastroesophageal Reflux She complains of belching, heartburn and a hoarse voice. This is a chronic problem. The current episode started more than 1 year ago. The problem occurs occasionally. Associated symptoms include fatigue. She has tried a PPI for the symptoms. The treatment provided mild relief.  Thyroid Problem Presents for follow-up visit. Symptoms include anxiety, fatigue and hoarse voice. Patient reports no cold intolerance, constipation or depressed mood. The symptoms have been stable.  Depression        This is a chronic problem.  The current episode started more than 1 year ago.   The onset quality is gradual.   The problem occurs intermittently.  Associated symptoms include fatigue.  Associated symptoms include no helplessness, no hopelessness and not sad.  Past treatments include SSRIs - Selective serotonin reuptake inhibitors.  Past medical history includes thyroid  problem and anxiety.   Anxiety Presents for follow-up visit. Symptoms include excessive worry and nervous/anxious behavior. Patient reports no depressed mood or shortness of breath. Symptoms occur occasionally. The severity of symptoms is moderate.        Review of Systems  Constitutional:  Positive for fatigue and malaise/fatigue.  HENT:  Positive for hoarse voice.   Respiratory:  Negative for shortness of breath.   Gastrointestinal:  Positive for heartburn. Negative for constipation.  Endocrine: Negative for cold intolerance.  Psychiatric/Behavioral:  The patient is nervous/anxious.   All other systems reviewed and are negative.      Objective:   Physical Exam Vitals reviewed.  Constitutional:      General: She is not in acute distress.    Appearance: She is well-developed.  HENT:     Head: Normocephalic and atraumatic.     Right Ear: Tympanic membrane normal.     Left Ear: Tympanic membrane normal.  Eyes:     Pupils: Pupils are equal, round, and reactive to light.  Neck:     Thyroid: No thyromegaly.  Cardiovascular:     Rate and Rhythm: Normal rate and regular rhythm.     Heart sounds: Normal heart sounds. No murmur heard. Pulmonary:     Effort: Pulmonary effort is normal. No respiratory distress.     Breath sounds: Normal breath sounds. No wheezing.  Abdominal:     General: Bowel sounds are normal. There is no distension.     Palpations: Abdomen is soft.     Tenderness: There is no abdominal tenderness.  Musculoskeletal:  General: No tenderness. Normal range of motion.     Cervical back: Normal range of motion and neck supple.  Skin:    General: Skin is warm and dry.  Neurological:     Mental Status: She is alert and oriented to person, place, and time.     Cranial Nerves: No cranial nerve deficit.     Deep Tendon Reflexes: Reflexes are normal and symmetric.  Psychiatric:        Behavior: Behavior normal.        Thought Content: Thought content normal.         Judgment: Judgment normal.     BP (!) 156/83   Pulse 95   Temp 97.9 F (36.6 C) (Temporal)   Wt 133 lb 3.2 oz (60.4 kg)   SpO2 (!) 87%   BMI 25.17 kg/m        Assessment & Plan:  Kelly Peters comes in today with chief complaint of Medical Management of Chronic Issues (DECLINES VACCINES TODAY )   Diagnosis and orders addressed:  1. Anxiety - ALPRAZolam (XANAX) 0.5 MG tablet; Take 1 tablet (0.5 mg total) by mouth 2 (two) times daily as needed for anxiety.  Dispense: 45 tablet; Refill: 2 - CMP14+EGFR - CBC with Differential/Platelet  2. Controlled substance agreement signed - ALPRAZolam (XANAX) 0.5 MG tablet; Take 1 tablet (0.5 mg total) by mouth 2 (two) times daily as needed for anxiety.  Dispense: 45 tablet; Refill: 2 - CMP14+EGFR - CBC with Differential/Platelet  3. Benzodiazepine dependence (HCC) - ALPRAZolam (XANAX) 0.5 MG tablet; Take 1 tablet (0.5 mg total) by mouth 2 (two) times daily as needed for anxiety.  Dispense: 45 tablet; Refill: 2 - CMP14+EGFR - CBC with Differential/Platelet  4. Essential hypertension - amLODipine (NORVASC) 5 MG tablet; Take 1 tablet (5 mg total) by mouth daily.  Dispense: 90 tablet; Refill: 1 - CMP14+EGFR - CBC with Differential/Platelet  5. Atopic dermatitis, unspecified type - clobetasol cream (TEMOVATE) 0.05 %; Apply 1 Application topically 2 (two) times daily.  Dispense: 60 g; Refill: 2 - CMP14+EGFR - CBC with Differential/Platelet  6. Gastroesophageal reflux disease, unspecified whether esophagitis present - omeprazole (PRILOSEC) 20 MG capsule; Take 1 capsule (20 mg total) by mouth daily.  Dispense: 30 capsule; Refill: 11 - CMP14+EGFR - CBC with Differential/Platelet  7. Depression, recurrent (HCC) - CMP14+EGFR - CBC with Differential/Platelet  8. Gastroesophageal reflux disease with esophagitis, unspecified whether hemorrhage - CMP14+EGFR - CBC with Differential/Platelet  9. Acquired hypothyroidism -  CMP14+EGFR - CBC with Differential/Platelet - TSH  10. Moderate COPD (chronic obstructive pulmonary disease) (HCC) - CMP14+EGFR - CBC with Differential/Platelet - DG Chest 2 View; Future - albuterol (VENTOLIN HFA) 108 (90 Base) MCG/ACT inhaler; Inhale 2 puffs into the lungs QID.  Dispense: 8 g; Refill: 2  11. Stage 3b chronic kidney disease (HCC) - CMP14+EGFR - CBC with Differential/Platelet  12. Vitamin B 12 deficiency - CMP14+EGFR - CBC with Differential/Platelet  13. Low oxygen saturation Start albuterol as needed Denies any SOB - CMP14+EGFR - CBC with Differential/Platelet - DG Chest 2 View; Future  14. Annual physical exam (Primary) - CMP14+EGFR - CBC with Differential/Platelet - Lipid panel - TSH  15. Centrilobular emphysema (HCC) - albuterol (VENTOLIN HFA) 108 (90 Base) MCG/ACT inhaler; Inhale 2 puffs into the lungs QID.  Dispense: 8 g; Refill: 2   Labs pending Denies any SOB at this time and refuses any long term inhaler. Will use albuterol as needed.  Patient reviewed in [place] controlled database,  no flags noted. Contract and drug screen are up to date.  Health Maintenance reviewed Diet and exercise encouraged  Follow up plan: 3 months   Jannifer Rodney, FNP

## 2023-09-15 NOTE — Patient Instructions (Signed)
 Chronic Obstructive Pulmonary Disease Exacerbation  Chronic obstructive pulmonary disease (COPD) is a long-term (chronic) lung problem. When you have COPD, it can feel harder to breathe in or out. COPD exacerbation is a flare-up of symptoms when breathing gets worse and more treatment may be needed. Without treatment, flare-ups can be life-threatening. If they happen often, your lungs can become more damaged. What are the causes? Not taking your usual COPD medicines as told by your health care provider. A cold or the flu, which can cause infection in your lungs. Being exposed to things that make your breathing worse, such as: Smoke. Air pollution. Fumes. Dust. Allergies. Weather changes. What are the signs or symptoms? Symptoms do not get better or get worse even if you take your medicines as told by your provider. Symptoms may include: More shortness of breath. You may only be able to speak one or two words at a time. More coughing or mucus from your lungs. More wheezing or chest tightness. Being more tired and having less energy. Confusion. How is this diagnosed? This condition is diagnosed based on: Symptoms that get worse. Your medical history. A physical exam. You may also have tests, including: A chest X-ray. Blood or mucus tests. How is this treated? You may be able to stay home or you may need to go to the hospital. Treatment may include: Taking medicines. These may include: Inhalers. These have medicines in them that you breathe in. These may be more of what you already take or they may be new. Steroids. These reduce inflammation in the airways. These may be inhaled, taken by mouth, or given in an IV. Antibiotics. These treat infection. Using oxygen. Using a device to help you clear mucus. Follow these instructions at home: Medicines Take your medicines only as told by your provider. If you were given antibiotics or steroids, take them as told by your provider. Do  not stop taking them even if you start to feel better. Lifestyle Several times a day, wash your hands with soap and water for at least 20 seconds. If you cannot use soap and water, use hand sanitizer. This may help keep you from getting an infection. Avoid being around crowds or people who are sick. Do not smoke or use any products that contain nicotine or tobacco. If you need help quitting, ask your provider. Return to your normal activities when your provider says that it's safe. Use breathing methods to control your stress and catch your breath. How is this prevented? Follow your COPD action plan. The action plan tells you what to do if you're feeling good and what to do when you start feeling worse. Discuss the plan often with your provider. Make sure you get all the shots, also called vaccines, that your provider recommends. Ask your provider about a flu shot and a pneumonia shot. Use oxygen therapy if told by your provider. If you need home oxygen therapy, ask your provider how often to check your oxygen level with a device called an oximeter. Keep all follow-up visits to review your COPD action plan. Your provider will want to check on your condition often to keep you healthy and out of the hospital. Contact a health care provider if: Your COPD symptoms get worse. You have a fever or chills. You have trouble doing daily activities. You have trouble breathing even when you are resting. Get help right away if: You are short of breath and cannot: Talk in full sentences. Do normal activities. You have chest  pain. You feel confused. These symptoms may be an emergency. Call 911 right away. Do not wait to see if the symptoms will go away. Do not drive yourself to the hospital. This information is not intended to replace advice given to you by your health care provider. Make sure you discuss any questions you have with your health care provider. Document Revised: 04/09/2023 Document  Reviewed: 09/22/2022 Elsevier Patient Education  2024 [employer].

## 2023-09-16 LAB — LIPID PANEL
Chol/HDL Ratio: 3.5 {ratio} (ref 0.0–4.4)
Cholesterol, Total: 227 mg/dL — ABNORMAL HIGH (ref 100–199)
HDL: 65 mg/dL (ref >39)
LDL Chol Calc (NIH): 141 mg/dL — ABNORMAL HIGH (ref 0–99)
Triglycerides: 122 mg/dL (ref 0–149)
VLDL Cholesterol Cal: 21 mg/dL (ref 5–40)

## 2023-09-16 LAB — CBC WITH DIFFERENTIAL/PLATELET
Basophils Absolute: 0.1 10*3/uL (ref 0.0–0.2)
Basos: 1 %
EOS (ABSOLUTE): 0.1 10*3/uL (ref 0.0–0.4)
Eos: 1 %
Hematocrit: 37.4 % (ref 34.0–46.6)
Hemoglobin: 12 g/dL (ref 11.1–15.9)
Immature Grans (Abs): 0 10*3/uL (ref 0.0–0.1)
Immature Granulocytes: 0 %
Lymphocytes Absolute: 1.6 10*3/uL (ref 0.7–3.1)
Lymphs: 20 %
MCH: 29.9 pg (ref 26.6–33.0)
MCHC: 32.1 g/dL (ref 31.5–35.7)
MCV: 93 fL (ref 79–97)
Monocytes Absolute: 0.7 10*3/uL (ref 0.1–0.9)
Monocytes: 8 %
Neutrophils Absolute: 5.4 10*3/uL (ref 1.4–7.0)
Neutrophils: 70 %
Platelets: 208 10*3/uL (ref 150–450)
RBC: 4.01 x10E6/uL (ref 3.77–5.28)
RDW: 12 % (ref 11.7–15.4)
WBC: 7.8 10*3/uL (ref 3.4–10.8)

## 2023-09-16 LAB — CMP14+EGFR
ALT: 8 [IU]/L (ref 0–32)
AST: 17 [IU]/L (ref 0–40)
Albumin: 4.3 g/dL (ref 3.6–4.6)
Alkaline Phosphatase: 129 [IU]/L — ABNORMAL HIGH (ref 44–121)
BUN/Creatinine Ratio: 13 (ref 12–28)
BUN: 18 mg/dL (ref 10–36)
Bilirubin Total: 0.6 mg/dL (ref 0.0–1.2)
CO2: 25 mmol/L (ref 20–29)
Calcium: 8.8 mg/dL (ref 8.7–10.3)
Chloride: 98 mmol/L (ref 96–106)
Creatinine, Ser: 1.35 mg/dL — ABNORMAL HIGH (ref 0.57–1.00)
Globulin, Total: 2.6 g/dL (ref 1.5–4.5)
Glucose: 98 mg/dL (ref 70–99)
Potassium: 4.7 mmol/L (ref 3.5–5.2)
Sodium: 142 mmol/L (ref 134–144)
Total Protein: 6.9 g/dL (ref 6.0–8.5)
eGFR: 37 mL/min/{1.73_m2} — ABNORMAL LOW (ref >59)

## 2023-09-16 LAB — TSH: TSH: 5.82 u[IU]/mL — ABNORMAL HIGH (ref 0.450–4.500)

## 2023-09-17 ENCOUNTER — Telehealth: Payer: Self-pay

## 2023-09-17 ENCOUNTER — Ambulatory Visit: Payer: Self-pay | Admitting: Family

## 2023-09-17 NOTE — Telephone Encounter (Signed)
 Labs were reviewed and information charted by NP given to patient's family calling in for patient. Daughter questioned about results from X-ray. X-ray hasn't been read yet and this was told to daughter. Daughter Kelly Peters is asking for phone call when X-ray has resulted. [phone] phone service is spotty and is hard to get a hold of. Daughter stated that her mother would like to be placed on thyroid medication. Would like to speak with staff about this. All questions answered and daughter verbalized understanding     Copied from CRM [id number]. Topic: Clinical - Lab/Test Results >> Sep 17, 2023  2:01 PM Gildardo Pounds wrote: Reason for CRM: Patient's daughter called about lab results. Reason for Disposition  [1] Other NON-URGENT information for PCP AND [2] does not require PCP response  Answer Assessment - Initial Assessment Questions 1. REASON FOR CALL or QUESTION: "What is your reason for calling today?" or "How can I best help you?" or "What question do you have that I can help answer?"     Daughter calling for patient to get labs review. Patient's phone is spotty with service 2. CALLER: Document the source of call. (e.g., laboratory, patient).     Daughter Kelly Peters who is on the [facility]  Protocols used: PCP Call - No Triage-A-AH

## 2023-09-17 NOTE — Telephone Encounter (Signed)
 Copied from CRM [id number]. Topic: Clinical - Lab/Test Results >> Sep 17, 2023  8:21 AM Kelly Peters wrote: Reason for CRM: Please call daughter, Kelly Peters at [phone] to review lab results. Kelly Peters phone is not working good. I did not see any notes in chart to review results.

## 2023-09-17 NOTE — Telephone Encounter (Signed)
 See result note.

## 2023-09-17 NOTE — Telephone Encounter (Signed)
 Please review labs.

## 2023-09-23 ENCOUNTER — Encounter: Payer: Self-pay | Admitting: Family Medicine

## 2023-10-07 ENCOUNTER — Encounter: Payer: Self-pay | Admitting: Family Medicine

## 2023-10-22 ENCOUNTER — Ambulatory Visit: Admitting: Family

## 2023-10-22 ENCOUNTER — Encounter: Payer: Self-pay | Admitting: Family

## 2023-10-22 ENCOUNTER — Ambulatory Visit (INDEPENDENT_AMBULATORY_CARE_PROVIDER_SITE_OTHER)

## 2023-10-22 VITALS — BP 138/73 | HR 87 | Temp 98.6°F | Ht 61.0 in | Wt 131.0 lb

## 2023-10-22 DIAGNOSIS — R1032 Left lower quadrant pain: Secondary | ICD-10-CM

## 2023-10-22 DIAGNOSIS — Z8719 Personal history of other diseases of the digestive system: Secondary | ICD-10-CM | POA: Diagnosis not present

## 2023-10-22 DIAGNOSIS — R1030 Lower abdominal pain, unspecified: Secondary | ICD-10-CM | POA: Diagnosis not present

## 2023-10-22 DIAGNOSIS — I10 Essential (primary) hypertension: Secondary | ICD-10-CM | POA: Diagnosis not present

## 2023-10-22 DIAGNOSIS — K5900 Constipation, unspecified: Secondary | ICD-10-CM | POA: Diagnosis not present

## 2023-10-22 LAB — URINALYSIS
Bilirubin, UA: NEGATIVE
Glucose, UA: NEGATIVE
Ketones, UA: NEGATIVE
Leukocytes,UA: NEGATIVE
Nitrite, UA: NEGATIVE
Protein,UA: NEGATIVE
RBC, UA: NEGATIVE
Specific Gravity, UA: 1.015 (ref 1.005–1.030)
Urobilinogen, Ur: 1 mg/dL (ref 0.2–1.0)
pH, UA: 6 (ref 5.0–7.5)

## 2023-10-22 NOTE — Progress Notes (Signed)
 Subjective:    Patient ID: Kelly Peters, female    DOB: November 10, 1929, 89 y.o.   MRN: [id number]  Chief Complaint  Patient presents with   Medical Management of Chronic Issues   PT presents to the office today with lower abdominal pain that started last week with mucus in her stools. Reports the last three days the abdominal pain and mucus has resolved.   Has hx of diverticulitis and was worried she was having a flare up.  Abdominal Pain This is a new problem. The current episode started in the past 7 days. The problem occurs intermittently. The problem has been resolved. The pain is located in the suprapubic region and LLQ. The pain is at a severity of 0/10 (last week was 2-3). The pain is mild. The quality of the pain is aching. Treatments tried: rest, soft diet. The treatment provided moderate relief.  Hypertension This is a chronic problem. The current episode started more than 1 year ago. The problem has been waxing and waning since onset. The problem is uncontrolled. Associated symptoms include shortness of breath. Pertinent negatives include no malaise/fatigue or peripheral edema. The current treatment provides moderate improvement.      Review of Systems  Constitutional:  Negative for malaise/fatigue.  Respiratory:  Positive for shortness of breath.   Gastrointestinal:  Positive for abdominal pain.  All other systems reviewed and are negative.   Social History   Socioeconomic History   Marital status: Widowed    Spouse name: Not on file   Number of children: 3   Years of education: 7   Highest education level: 7th grade  Occupational History   Occupation: Retired  Tobacco Use   Smoking status: Former    Current packs/day: 0.00    Average packs/day: 0.5 packs/day for 58.0 years (29.0 ttl pk-yrs)    Types: Cigarettes    Start date: 07/23/1960    Quit date: 07/23/2018    Years since quitting: 5.2   Smokeless tobacco: Never   Tobacco comments:    down to 4-5 cigarettes  a day 05/12/18  Vaping Use   Vaping status: Never Used  Substance and Sexual Activity   Alcohol use: Not Currently   Drug use: Never   Sexual activity: Not Currently    Birth control/protection: Surgical  Other Topics Concern   Not on file  Social History Narrative   Daughter lives next door   2 of her children are here daily   One of them comes 2-3 times per week   Social Drivers of Health   Financial Resource Strain: Low Risk  (03/17/2023)   Overall Financial Resource Strain (CARDIA)    Difficulty of Paying Living Expenses: Not hard at all  Food Insecurity: No Food Insecurity (03/17/2023)   Hunger Vital Sign    Worried About Running Out of Food in the Last Year: Never true    Ran Out of Food in the Last Year: Never true  Transportation Needs: No Transportation Needs (03/17/2023)   PRAPARE - Administrator, Civil Service (Medical): No    Lack of Transportation (Non-Medical): No  Physical Activity: Insufficiently Active (03/17/2023)   Exercise Vital Sign    Days of Exercise per Week: 3 days    Minutes of Exercise per Session: 30 min  Stress: No Stress Concern Present (03/17/2023)   [employer] of Occupational Health - Occupational Stress Questionnaire    Feeling of Stress : Not at all  Social Connections: Moderately Isolated (03/17/2023)  Social Advertising account executive [NHANES]    Frequency of Communication with Friends and Family: More than three times a week    Frequency of Social Gatherings with Friends and Family: More than three times a week    Attends Religious Services: 1 to 4 times per year    Active Member of [employer] or Organizations: No    Attends Banker Meetings: Never    Marital Status: Widowed   Family History  Problem Relation Age of Onset   Stroke Mother    Heart attack Father    Heart attack Brother    Diabetes Sister    Stroke Sister    COPD Daughter    Hypertension Daughter    Heart disease Son    Hypertension Son     Cancer Brother        kidney cancer   Stroke Brother    COPD Brother    Diabetes Sister    Thyroid disease Daughter         Objective:   Physical Exam Vitals reviewed.  Constitutional:      General: She is not in acute distress.    Appearance: She is well-developed.  HENT:     Head: Normocephalic and atraumatic.  Eyes:     Pupils: Pupils are equal, round, and reactive to light.  Neck:     Thyroid: No thyromegaly.  Cardiovascular:     Rate and Rhythm: Normal rate and regular rhythm.     Heart sounds: Normal heart sounds. No murmur heard. Pulmonary:     Effort: Pulmonary effort is normal. No respiratory distress.     Breath sounds: Normal breath sounds. No wheezing.  Abdominal:     General: Bowel sounds are normal. There is no distension.     Palpations: Abdomen is soft.     Tenderness: There is no abdominal tenderness (no tenderness noted).  Musculoskeletal:        General: No tenderness. Normal range of motion.     Cervical back: Normal range of motion and neck supple.  Skin:    General: Skin is warm and dry.  Neurological:     Mental Status: She is alert and oriented to person, place, and time.     Cranial Nerves: No cranial nerve deficit.     Deep Tendon Reflexes: Reflexes are normal and symmetric.  Psychiatric:        Behavior: Behavior normal.        Thought Content: Thought content normal.        Judgment: Judgment normal.       BP 138/73   Pulse 87   Temp 98.6 F (37 C) (Temporal)   Ht 5\' 1"  (1.549 m)   Wt 131 lb (59.4 kg)   BMI 24.75 kg/m      Assessment & Plan:  LUNAH LOSASSO comes in today with chief complaint of Medical Management of Chronic Issues   Diagnosis and orders addressed:  1. Essential hypertension At goal   2. Left lower quadrant abdominal pain (Primary) Pain has resolved at this time Avoid seeds and nuts, force fluids Labs pending, if WBC is elevated will send in antibiotics  - CBC with Differential/Platelet -  Urinalysis - DG Abd 1 View; Future  3. H/O diverticulitis of colon - CBC with Differential/Platelet    Return if symptoms worsen or fail to improve.    Jannifer Rodney, FNP

## 2023-10-22 NOTE — Patient Instructions (Signed)
Diverticulitis  Diverticulitis happens when poop (stool) and bacteria get trapped in small pouches in the colon called diverticula. These pouches may form if you have a condition called diverticulosis. When the poop and bacteria get trapped, it can cause an infection and inflammation. Diverticulitis may cause severe stomach pain and diarrhea. It can also lead to tissue damage in your colon. This can cause bleeding or blockage. In some cases, the diverticula may burst (rupture). This can cause infected poop to go into other parts of your abdomen. What are the causes? This condition is caused by poop getting trapped in the diverticula. This allows bacteria to grow. It can lead to inflammation and infection. What increases the risk? You are more likely to get this condition if you have diverticulosis. You are also more at risk if: You are overweight or obese. You do not get enough exercise. You drink alcohol. You smoke. You eat a lot of red meat, such as beef, pork, or lamb. You do not get enough fiber. Foods high in fiber include fruits, vegetables, beans, nuts, and whole grains. You are over 35 years of age. What are the signs or symptoms? Symptoms of this condition may include: Pain and tenderness in the abdomen. This pain is often felt on the left side but may occur in other spots. Fever and chills. Nausea and vomiting. Cramping. Bloating. Changes in how often you poop. Blood in your poop. How is this diagnosed? This condition is diagnosed based on your medical history and a physical exam. You may also have tests done to make sure there is nothing else causing your condition. These tests may include: Blood tests. Tests done on your pee (urine). A CT scan of the abdomen. You may need to have a colonoscopy. This is an exam to look at your whole large intestine. During the exam, a tube is put into the opening of your butt (anus) and then moved into your rectum, colon, and other parts of  the large intestine. This exam is done to look at the diverticula. It can also see if there is something else that may be causing your symptoms. How is this treated? Most cases are mild and can be treated at home. You may be told to: Take over-the-counter pain medicine. Only eat and drink clear liquids. Take antibiotics. Rest. More severe cases may need to be treated at a hospital. Treatment may include: Not eating or drinking. Taking pain medicines. Getting antibiotics through an IV. Getting fluids and nutrition through an IV. Surgery. Follow these instructions at home: Medicines Take over-the-counter and prescription medicines only as told by your health care provider. These include fiber supplements, probiotics, and medicines to soften your poop (stool softeners). If you were prescribed antibiotics, take them as told by your provider. Do not stop using the antibiotic even if you start to feel better. Ask your provider if the medicine prescribed to you requires you to avoid driving or using machinery. Eating and drinking  Follow the diet told by your provider. You may need to only eat and drink liquids. After your symptoms get better, you may be able to return to a more normal diet. You may be told to eat at least 25 grams (25 g) of fiber each day. Fiber makes it easier to poop. Healthy sources of fiber include: Berries. One cup has 4-8 g of fiber. Beans or lentils. One-half cup has 5-8 g of fiber. Green vegetables. One cup has 4 g of fiber. Avoid eating red meat.  General instructions Do not use any products that contain nicotine or tobacco. These products include cigarettes, chewing tobacco, and vaping devices, such as e-cigarettes. If you need help quitting, ask your provider. Exercise for at least 30 minutes, 3 times a week. Exercise hard enough to raise your heart rate and break a sweat. Contact a health care provider if: Your pain gets worse. Your pooping does not go back to  normal. Your symptoms do not get better with treatment. Your symptoms get worse all of a sudden. You have a fever. You vomit more than one time. Your poop is bloody, black, or tarry. This information is not intended to replace advice given to you by your health care provider. Make sure you discuss any questions you have with your health care provider. Document Revised: 04/03/2022 Document Reviewed: 04/03/2022 Elsevier Patient Education  2024 [employer].

## 2023-10-23 ENCOUNTER — Telehealth: Payer: Self-pay

## 2023-10-23 LAB — CBC WITH DIFFERENTIAL/PLATELET
Basophils Absolute: 0.1 10*3/uL (ref 0.0–0.2)
Basos: 1 %
EOS (ABSOLUTE): 0.2 10*3/uL (ref 0.0–0.4)
Eos: 2 %
Hematocrit: 36.3 % (ref 34.0–46.6)
Hemoglobin: 11.5 g/dL (ref 11.1–15.9)
Immature Grans (Abs): 0 10*3/uL (ref 0.0–0.1)
Immature Granulocytes: 0 %
Lymphocytes Absolute: 1.6 10*3/uL (ref 0.7–3.1)
Lymphs: 20 %
MCH: 29.6 pg (ref 26.6–33.0)
MCHC: 31.7 g/dL (ref 31.5–35.7)
MCV: 94 fL (ref 79–97)
Monocytes Absolute: 0.6 10*3/uL (ref 0.1–0.9)
Monocytes: 7 %
Neutrophils Absolute: 5.3 10*3/uL (ref 1.4–7.0)
Neutrophils: 70 %
Platelets: 286 10*3/uL (ref 150–450)
RBC: 3.88 x10E6/uL (ref 3.77–5.28)
RDW: 11.9 % (ref 11.7–15.4)
WBC: 7.7 10*3/uL (ref 3.4–10.8)

## 2023-11-25 ENCOUNTER — Other Ambulatory Visit: Payer: Self-pay | Admitting: Family

## 2023-11-25 DIAGNOSIS — F419 Anxiety disorder, unspecified: Secondary | ICD-10-CM

## 2023-11-25 DIAGNOSIS — F1320 Sedative, hypnotic or anxiolytic dependence, uncomplicated: Secondary | ICD-10-CM

## 2023-11-25 DIAGNOSIS — Z79899 Other long term (current) drug therapy: Secondary | ICD-10-CM

## 2023-12-03 NOTE — Telephone Encounter (Signed)
 fyi

## 2023-12-15 ENCOUNTER — Ambulatory Visit: Payer: Self-pay

## 2023-12-15 NOTE — Telephone Encounter (Signed)
  Chief Complaint: Blood in stool Symptoms: see above Frequency: twice today Pertinent Negatives: Patient denies abdominal pain, dizziness, fever Disposition: [] ED /[] Urgent Care (no appt availability in office) / [] Appointment(In office/virtual)/ []  [facility]/ [x] Home Care/ [] Refused Recommended Disposition /[] [facility]/ []  Follow-up with PCP Additional Notes: Patient calling in to report she has had two separate bowel movements with mild, bright red blood today. Patient states she does have diverticulosis and unsure if that is cause. Patient denies current constipation, abdominal pain, diarrhea, dizziness. Patient states she will continue to monitor. Patient has appt on Thursday with PCP.   Copied from CRM [id number]. Topic: Clinical - Red Word Triage >> Dec 15, 2023 11:14 AM Donald Frost wrote: Red Word that prompted transfer to Nurse Triage: The patient called stating when she went to the bathroom this morning she was bleeding from her rectum. I will transfer her to E2C2 NT Reason for Disposition  [1] Normal formed BM AND [2] few streaks or drops of blood on surface of BM  Answer Assessment - Initial Assessment Questions 1. APPEARANCE of BLOOD: "What color is it?" "Is it passed separately, on the surface of the stool, or mixed in with the stool?"      Red 2. AMOUNT: "How much blood was passed?"      Mild amount 3. FREQUENCY: "How many times has blood been passed with the stools?"      2 times today 4. ONSET: "When was the blood first seen in the stools?" (Days or weeks)      This morning 5. DIARRHEA: "Is there also some diarrhea?" If Yes, ask: "How many diarrhea stools in the past 24 hours?"      No 6. CONSTIPATION: "Do you have constipation?" If Yes, ask: "How bad is it?"     Yes 7. RECURRENT SYMPTOMS: "Have you had blood in your stools before?" If Yes, ask: "When was the last time?" and "What happened that time?"      No 8. BLOOD THINNERS: "Do you take any blood  thinners?" (e.g., Coumadin/warfarin, Pradaxa/dabigatran, aspirin )     Aspirin   9. OTHER SYMPTOMS: "Do you have any other symptoms?"  (e.g., abdomen pain, vomiting, dizziness, fever)     No  Protocols used: Rectal Bleeding-A-AH

## 2023-12-17 ENCOUNTER — Encounter: Payer: Self-pay | Admitting: Family

## 2023-12-17 ENCOUNTER — Ambulatory Visit (INDEPENDENT_AMBULATORY_CARE_PROVIDER_SITE_OTHER): Payer: Medicare Other | Admitting: Family

## 2023-12-17 VITALS — BP 132/85 | HR 90 | Temp 98.2°F | Ht 61.0 in | Wt 130.2 lb

## 2023-12-17 DIAGNOSIS — K625 Hemorrhage of anus and rectum: Secondary | ICD-10-CM

## 2023-12-17 DIAGNOSIS — F339 Major depressive disorder, recurrent, unspecified: Secondary | ICD-10-CM

## 2023-12-17 DIAGNOSIS — E039 Hypothyroidism, unspecified: Secondary | ICD-10-CM

## 2023-12-17 DIAGNOSIS — K2100 Gastro-esophageal reflux disease with esophagitis, without bleeding: Secondary | ICD-10-CM

## 2023-12-17 DIAGNOSIS — Z79899 Other long term (current) drug therapy: Secondary | ICD-10-CM

## 2023-12-17 DIAGNOSIS — F419 Anxiety disorder, unspecified: Secondary | ICD-10-CM | POA: Diagnosis not present

## 2023-12-17 DIAGNOSIS — J449 Chronic obstructive pulmonary disease, unspecified: Secondary | ICD-10-CM

## 2023-12-17 DIAGNOSIS — N1832 Chronic kidney disease, stage 3b: Secondary | ICD-10-CM

## 2023-12-17 DIAGNOSIS — I10 Essential (primary) hypertension: Secondary | ICD-10-CM

## 2023-12-17 DIAGNOSIS — F1320 Sedative, hypnotic or anxiolytic dependence, uncomplicated: Secondary | ICD-10-CM

## 2023-12-17 MED ORDER — ALPRAZOLAM 0.5 MG PO TABS
0.5000 mg | ORAL_TABLET | Freq: Two times a day (BID) | ORAL | 2 refills | Status: DC | PRN
Start: 2023-12-17 — End: 2024-03-18

## 2023-12-17 NOTE — Patient Instructions (Signed)
Rectal Bleeding  Rectal bleeding is when blood passes out of the opening of the butt (anus). If you have rectal bleeding, you may see bright red blood in your underwear or in the toilet after you poop. You may also have blood mixed with your poop (stool) or dark red or black poop. Rectal bleeding is often a sign that something is wrong. Causes of rectal bleeding include: Diverticulosis. This is when sacs or pockets form in the large intestine (colon). Hemorrhoids. These are blood vessels around the anus or inside the rectum that are larger than normal. Anal fissures. This is a tear in the anus. Proctitis and colitis. These are conditions that happen when the rectum, colon, or anus becomes inflamed. Polyps. These are growths. They may be cancer (malignant) or not cancer (benign). Infections of the intestines. Fistulas. These are abnormal openings in the rectum and anus. Rectal prolapse. This is when a part of the rectum sticks out from the anus. Follow these instructions at home: Medicines Take over-the-counter and prescription medicines only as told by your health care provider. Ask your provider about changing or stopping your regular medicines. These include any diabetes medicine or blood thinners you take. Medicines that thin the blood can make rectal bleeding worse. Managing constipation Your condition may cause constipation. To prevent or treat constipation, or to help make your poop soft, you may need to: Drink enough fluid to keep your pee (urine) pale yellow. Take over-the-counter or prescription medicines. Eat foods that are high in fiber, such as beans, whole grains, and fresh fruits and vegetables. Ask your provider if you need a fiber supplement. Limit foods that are high in fat and processed sugars, such as fried or sweet foods.  General instructions Try not to strain when you poop. Take a warm bath. This may help to soothe pain in your rectum. Watch for any changes in your  symptoms. Contact a health care provider if: You have pain or tenderness in your abdomen. You have a fever. You feel weak or nauseous. You cannot poop. You have new or more rectal bleeding. You have black or dark red poop. You vomit, and the vomit has blood or something that looks like coffee grounds in it. Get help right away if: You faint. You have severe pain in your rectum. These symptoms may be an emergency. Get help right away. Call 911. Do not wait to see if the symptoms will go away. Do not drive yourself to the hospital. This information is not intended to replace advice given to you by your health care provider. Make sure you discuss any questions you have with your health care provider. Document Revised: 02/25/2022 Document Reviewed: 02/25/2022 Elsevier Patient Education  2024 [employer].

## 2023-12-17 NOTE — Progress Notes (Signed)
 Subjective:    Patient ID: Kelly Peters, female    DOB: 1929/11/30, 89 y.o.   MRN: [id number]  Chief Complaint  Patient presents with   Medical Management of Chronic Issues    BLOOD IN STOOL MONDAY AND TUESDAY. BRIGHT RED NO PAIN SINCE AND NO MORE    Pt presents to the office today for  chronic follow up.  She has COPD and states her breathing is "fine". She has an albuterol , but doesn't have to use it. Quit smoking 07/2018.     She did not start the levothyroxine  because she did not want medications. Her TSH is mildly elevated.    She has CKD and limits NSAID"s.   Pt complaining of intermittent rectal bleeding. Was seen last month with abdominal pain and mucus in stools. Had normal labs and pain resolved. However, earlier in the week she noticed bright red blood in her stool two different times. Denies any pain and states it has resolved now.  Hypertension This is a chronic problem. The current episode started more than 1 year ago. The problem has been resolved since onset. The problem is controlled. Associated symptoms include anxiety. Pertinent negatives include no malaise/fatigue, peripheral edema or shortness of breath. Risk factors for coronary artery disease include dyslipidemia and sedentary lifestyle. The current treatment provides moderate improvement. Identifiable causes of hypertension include a thyroid  problem.  Gastroesophageal Reflux She complains of belching, heartburn and a hoarse voice. This is a chronic problem. The current episode started more than 1 year ago. The problem occurs occasionally. Associated symptoms include fatigue. She has tried a PPI for the symptoms. The treatment provided moderate relief.  Thyroid  Problem Presents for follow-up visit. Symptoms include anxiety, fatigue and hoarse voice. Patient reports no cold intolerance, constipation, depressed mood or diarrhea. The symptoms have been stable.  Depression        This is a chronic problem.  The  current episode started more than 1 year ago.   The onset quality is gradual.   The problem occurs intermittently.  Associated symptoms include fatigue.  Associated symptoms include no helplessness, no hopelessness and not sad.  Past treatments include nothing.  Past medical history includes thyroid  problem and anxiety.   Anxiety Presents for follow-up visit. Symptoms include excessive worry and nervous/anxious behavior. Patient reports no depressed mood or shortness of breath. Symptoms occur occasionally. The severity of symptoms is moderate.    Rectal Bleeding  The current episode started 3 to 5 days ago. The onset was sudden. The problem occurs occasionally. Pertinent negatives include no diarrhea.      Review of Systems  Constitutional:  Positive for fatigue. Negative for malaise/fatigue.  HENT:  Positive for hoarse voice.   Respiratory:  Negative for shortness of breath.   Gastrointestinal:  Positive for heartburn and hematochezia. Negative for constipation and diarrhea.  Endocrine: Negative for cold intolerance.  Psychiatric/Behavioral:  The patient is nervous/anxious.   All other systems reviewed and are negative.  Family History  Problem Relation Age of Onset   Stroke Mother    Heart attack Father    Heart attack Brother    Diabetes Sister    Stroke Sister    COPD Daughter    Hypertension Daughter    Heart disease Son    Hypertension Son    Cancer Brother        kidney cancer   Stroke Brother    COPD Brother    Diabetes Sister    Thyroid  disease Daughter  Social History   Socioeconomic History   Marital status: Widowed    Spouse name: Not on file   Number of children: 3   Years of education: 7   Highest education level: 7th grade  Occupational History   Occupation: Retired  Tobacco Use   Smoking status: Former    Current packs/day: 0.00    Average packs/day: 0.5 packs/day for 58.0 years (29.0 ttl pk-yrs)    Types: Cigarettes    Start date: 07/23/1960     Quit date: 07/23/2018    Years since quitting: 5.4   Smokeless tobacco: Never   Tobacco comments:    down to 4-5 cigarettes a day 05/12/18  Vaping Use   Vaping status: Never Used  Substance and Sexual Activity   Alcohol use: Not Currently   Drug use: Never   Sexual activity: Not Currently    Birth control/protection: Surgical  Other Topics Concern   Not on file  Social History Narrative   Daughter lives next door   2 of her children are here daily   One of them comes 2-3 times per week   Social Drivers of Health   Financial Resource Strain: Low Risk  (03/17/2023)   Overall Financial Resource Strain (CARDIA)    Difficulty of Paying Living Expenses: Not hard at all  Food Insecurity: No Food Insecurity (03/17/2023)   Hunger Vital Sign    Worried About Running Out of Food in the Last Year: Never true    Ran Out of Food in the Last Year: Never true  Transportation Needs: No Transportation Needs (03/17/2023)   PRAPARE - Administrator, Civil Service (Medical): No    Lack of Transportation (Non-Medical): No  Physical Activity: Insufficiently Active (03/17/2023)   Exercise Vital Sign    Days of Exercise per Week: 3 days    Minutes of Exercise per Session: 30 min  Stress: No Stress Concern Present (03/17/2023)   [employer] of Occupational Health - Occupational Stress Questionnaire    Feeling of Stress : Not at all  Social Connections: Moderately Isolated (03/17/2023)   Social Connection and Isolation Panel [NHANES]    Frequency of Communication with Friends and Family: More than three times a week    Frequency of Social Gatherings with Friends and Family: More than three times a week    Attends Religious Services: 1 to 4 times per year    Active Member of [employer] or Organizations: No    Attends Banker Meetings: Never    Marital Status: Widowed       Objective:   Physical Exam Vitals reviewed.  Constitutional:      General: She is not in acute  distress.    Appearance: She is well-developed.  HENT:     Head: Normocephalic and atraumatic.     Right Ear: Tympanic membrane normal.     Left Ear: Tympanic membrane normal.  Eyes:     Pupils: Pupils are equal, round, and reactive to light.  Neck:     Thyroid : No thyromegaly.  Cardiovascular:     Rate and Rhythm: Normal rate and regular rhythm.     Heart sounds: Normal heart sounds. No murmur heard. Pulmonary:     Effort: Pulmonary effort is normal. No respiratory distress.     Breath sounds: Normal breath sounds. No wheezing.  Abdominal:     General: Bowel sounds are normal. There is no distension.     Palpations: Abdomen is soft.  Tenderness: There is no abdominal tenderness.  Musculoskeletal:        General: No tenderness. Normal range of motion.     Cervical back: Normal range of motion and neck supple.  Skin:    General: Skin is warm and dry.  Neurological:     Mental Status: She is alert and oriented to person, place, and time.     Cranial Nerves: No cranial nerve deficit.     Deep Tendon Reflexes: Reflexes are normal and symmetric.  Psychiatric:        Behavior: Behavior normal.        Thought Content: Thought content normal.        Judgment: Judgment normal.     BP 132/85   Pulse 90   Temp 98.2 F (36.8 C) (Temporal)   Ht 5\' 1"  (1.549 m)   Wt 130 lb 3.2 oz (59.1 kg)   BMI 24.60 kg/m        Assessment & Plan:  DALISA FORRER comes in today with chief complaint of Medical Management of Chronic Issues (BLOOD IN STOOL MONDAY AND TUESDAY. BRIGHT RED NO PAIN SINCE AND NO MORE )   Diagnosis and orders addressed:  1. Anxiety - ALPRAZolam  (XANAX ) 0.5 MG tablet; Take 1 tablet (0.5 mg total) by mouth 2 (two) times daily as needed for anxiety.  Dispense: 45 tablet; Refill: 2 - CMP14+EGFR - CBC with Differential/Platelet  2. Controlled substance agreement signed - ALPRAZolam  (XANAX ) 0.5 MG tablet; Take 1 tablet (0.5 mg total) by mouth 2 (two) times daily  as needed for anxiety.  Dispense: 45 tablet; Refill: 2 - CMP14+EGFR - CBC with Differential/Platelet  3. Benzodiazepine dependence (HCC) - ALPRAZolam  (XANAX ) 0.5 MG tablet; Take 1 tablet (0.5 mg total) by mouth 2 (two) times daily as needed for anxiety.  Dispense: 45 tablet; Refill: 2 - CMP14+EGFR - CBC with Differential/Platelet  4. Essential hypertension (Primary) - CMP14+EGFR - CBC with Differential/Platelet  5. Depression, recurrent (HCC) - CMP14+EGFR - CBC with Differential/Platelet  6. Moderate COPD (chronic obstructive pulmonary disease) (HCC) - CMP14+EGFR - CBC with Differential/Platelet  7. Gastroesophageal reflux disease with esophagitis, unspecified whether hemorrhage - CMP14+EGFR - CBC with Differential/Platelet  8. Stage 3b chronic kidney disease (HCC) - CMP14+EGFR - CBC with Differential/Platelet  9. Acquired hypothyroidism - CMP14+EGFR - CBC with Differential/Platelet  10. Rectal bleed - CMP14+EGFR - CBC with Differential/Platelet - Ambulatory referral to Pediatric Gastroenterology  Labs pending Denies any SOB at this time and refuses any long term inhaler. Will use albuterol  as needed.  Given rectal bleeding, mucus stools will do referral to GI Patient reviewed in [place] controlled database, no flags noted. Contract and drug screen are up to date.  Health Maintenance reviewed Diet and exercise encouraged  Follow up plan: 3 months   Tommas Fragmin, FNP

## 2023-12-18 ENCOUNTER — Ambulatory Visit: Payer: Self-pay | Admitting: Family

## 2023-12-18 LAB — CMP14+EGFR
ALT: 7 IU/L (ref 0–32)
AST: 17 IU/L (ref 0–40)
Albumin: 4.6 g/dL (ref 3.6–4.6)
Alkaline Phosphatase: 131 IU/L — ABNORMAL HIGH (ref 44–121)
BUN/Creatinine Ratio: 15 (ref 12–28)
BUN: 19 mg/dL (ref 10–36)
Bilirubin Total: 0.7 mg/dL (ref 0.0–1.2)
CO2: 21 mmol/L (ref 20–29)
Calcium: 9.2 mg/dL (ref 8.7–10.3)
Chloride: 98 mmol/L (ref 96–106)
Creatinine, Ser: 1.31 mg/dL — ABNORMAL HIGH (ref 0.57–1.00)
Globulin, Total: 2.5 g/dL (ref 1.5–4.5)
Glucose: 112 mg/dL — ABNORMAL HIGH (ref 70–99)
Potassium: 4.6 mmol/L (ref 3.5–5.2)
Sodium: 139 mmol/L (ref 134–144)
Total Protein: 7.1 g/dL (ref 6.0–8.5)
eGFR: 38 mL/min/{1.73_m2} — ABNORMAL LOW (ref >59)

## 2023-12-18 LAB — CBC WITH DIFFERENTIAL/PLATELET
Basophils Absolute: 0.1 10*3/uL (ref 0.0–0.2)
Basos: 1 %
EOS (ABSOLUTE): 0.1 10*3/uL (ref 0.0–0.4)
Eos: 2 %
Hematocrit: 36 % (ref 34.0–46.6)
Hemoglobin: 11.5 g/dL (ref 11.1–15.9)
Immature Grans (Abs): 0 10*3/uL (ref 0.0–0.1)
Immature Granulocytes: 0 %
Lymphocytes Absolute: 1.4 10*3/uL (ref 0.7–3.1)
Lymphs: 19 %
MCH: 29.8 pg (ref 26.6–33.0)
MCHC: 31.9 g/dL (ref 31.5–35.7)
MCV: 93 fL (ref 79–97)
Monocytes Absolute: 0.7 10*3/uL (ref 0.1–0.9)
Monocytes: 9 %
Neutrophils Absolute: 5.2 10*3/uL (ref 1.4–7.0)
Neutrophils: 69 %
Platelets: 203 10*3/uL (ref 150–450)
RBC: 3.86 x10E6/uL (ref 3.77–5.28)
RDW: 12.1 % (ref 11.7–15.4)
WBC: 7.5 10*3/uL (ref 3.4–10.8)

## 2024-01-12 ENCOUNTER — Other Ambulatory Visit: Payer: Self-pay | Admitting: Family

## 2024-01-12 DIAGNOSIS — K625 Hemorrhage of anus and rectum: Secondary | ICD-10-CM

## 2024-01-27 ENCOUNTER — Other Ambulatory Visit: Payer: Self-pay | Admitting: Family

## 2024-01-27 DIAGNOSIS — I10 Essential (primary) hypertension: Secondary | ICD-10-CM

## 2024-03-18 ENCOUNTER — Encounter: Payer: Self-pay | Admitting: Family

## 2024-03-18 ENCOUNTER — Ambulatory Visit: Admitting: Family

## 2024-03-18 VITALS — BP 130/70 | HR 80 | Temp 97.6°F | Ht 61.0 in | Wt 131.0 lb

## 2024-03-18 DIAGNOSIS — K2100 Gastro-esophageal reflux disease with esophagitis, without bleeding: Secondary | ICD-10-CM

## 2024-03-18 DIAGNOSIS — J449 Chronic obstructive pulmonary disease, unspecified: Secondary | ICD-10-CM

## 2024-03-18 DIAGNOSIS — F339 Major depressive disorder, recurrent, unspecified: Secondary | ICD-10-CM

## 2024-03-18 DIAGNOSIS — F419 Anxiety disorder, unspecified: Secondary | ICD-10-CM | POA: Diagnosis not present

## 2024-03-18 DIAGNOSIS — F1320 Sedative, hypnotic or anxiolytic dependence, uncomplicated: Secondary | ICD-10-CM | POA: Diagnosis not present

## 2024-03-18 DIAGNOSIS — Z79899 Other long term (current) drug therapy: Secondary | ICD-10-CM | POA: Diagnosis not present

## 2024-03-18 DIAGNOSIS — I10 Essential (primary) hypertension: Secondary | ICD-10-CM

## 2024-03-18 DIAGNOSIS — N1832 Chronic kidney disease, stage 3b: Secondary | ICD-10-CM | POA: Diagnosis not present

## 2024-03-18 DIAGNOSIS — E039 Hypothyroidism, unspecified: Secondary | ICD-10-CM

## 2024-03-18 DIAGNOSIS — E538 Deficiency of other specified B group vitamins: Secondary | ICD-10-CM | POA: Diagnosis not present

## 2024-03-18 MED ORDER — ALPRAZOLAM 0.5 MG PO TABS: 0.5000 mg | ORAL_TABLET | Freq: Two times a day (BID) | ORAL | 2 refills | Status: DC | PRN

## 2024-03-18 NOTE — Patient Instructions (Signed)
 Health Maintenance After Age 88 After age 27, you are at a higher risk for certain long-term diseases and infections as well as injuries from falls. Falls are a major cause of broken bones and head injuries in people who are older than age 73. Getting regular preventive care can help to keep you healthy and well. Preventive care includes getting regular testing and making lifestyle changes as recommended by your health care provider. Talk with your health care provider about: Which screenings and tests you should have. A screening is a test that checks for a disease when you have no symptoms. A diet and exercise plan that is right for you. What should I know about screenings and tests to prevent falls? Screening and testing are the best ways to find a health problem early. Early diagnosis and treatment give you the best chance of managing medical conditions that are common after age 90. Certain conditions and lifestyle choices may make you more likely to have a fall. Your health care provider may recommend: Regular vision checks. Poor vision and conditions such as cataracts can make you more likely to have a fall. If you wear glasses, make sure to get your prescription updated if your vision changes. Medicine review. Work with your health care provider to regularly review all of the medicines you are taking, including over-the-counter medicines. Ask your health care provider about any side effects that may make you more likely to have a fall. Tell your health care provider if any medicines that you take make you feel dizzy or sleepy. Strength and balance checks. Your health care provider may recommend certain tests to check your strength and balance while standing, walking, or changing positions. Foot health exam. Foot pain and numbness, as well as not wearing proper footwear, can make you more likely to have a fall. Screenings, including: Osteoporosis screening. Osteoporosis is a condition that causes  the bones to get weaker and break more easily. Blood pressure screening. Blood pressure changes and medicines to control blood pressure can make you feel dizzy. Depression screening. You may be more likely to have a fall if you have a fear of falling, feel depressed, or feel unable to do activities that you used to do. Alcohol  use screening. Using too much alcohol  can affect your balance and may make you more likely to have a fall. Follow these instructions at home: Lifestyle Do not drink alcohol  if: Your health care provider tells you not to drink. If you drink alcohol : Limit how much you have to: 0-1 drink a day for women. 0-2 drinks a day for men. Know how much alcohol  is in your drink. In the [place] one drink equals one 12 oz bottle of beer (355 mL), one 5 oz glass of wine (148 mL), or one 1 oz glass of hard liquor (44 mL). Do not use any products that contain nicotine or tobacco. These products include cigarettes, chewing tobacco, and vaping devices, such as e-cigarettes. If you need help quitting, ask your health care provider. Activity  Follow a regular exercise program to stay fit. This will help you maintain your balance. Ask your health care provider what types of exercise are appropriate for you. If you need a cane or walker, use it as recommended by your health care provider. Wear supportive shoes that have nonskid soles. Safety  Remove any tripping hazards, such as rugs, cords, and clutter. Install safety equipment such as grab bars in bathrooms and safety rails on stairs. Keep rooms and walkways  well-lit. General instructions Talk with your health care provider about your risks for falling. Tell your health care provider if: You fall. Be sure to tell your health care provider about all falls, even ones that seem minor. You feel dizzy, tiredness (fatigue), or off-balance. Take over-the-counter and prescription medicines only as told by your health care provider. These include  supplements. Eat a healthy diet and maintain a healthy weight. A healthy diet includes low-fat dairy products, low-fat (lean) meats, and fiber from whole grains, beans, and lots of fruits and vegetables. Stay current with your vaccines. Schedule regular health, dental, and eye exams. Summary Having a healthy lifestyle and getting preventive care can help to protect your health and wellness after age 15. Screening and testing are the best way to find a health problem early and help you avoid having a fall. Early diagnosis and treatment give you the best chance for managing medical conditions that are more common for people who are older than age 42. Falls are a major cause of broken bones and head injuries in people who are older than age 64. Take precautions to prevent a fall at home. Work with your health care provider to learn what changes you can make to improve your health and wellness and to prevent falls. This information is not intended to replace advice given to you by your health care provider. Make sure you discuss any questions you have with your health care provider. Document Revised: 11/26/2020 Document Reviewed: 11/26/2020 Elsevier Patient Education  2024 [employer].

## 2024-03-18 NOTE — Progress Notes (Signed)
 Subjective:    Patient ID: Kelly Peters, female    DOB: Sep 02, 1929, 89 y.o.   MRN: [id number]  Chief Complaint  Patient presents with   Medical Management of Chronic Issues   Pt presents to the office today for  chronic follow up.  She has COPD and states her breathing is fine. She has an albuterol , but doesn't have to use it. Quit smoking 07/2018.     She did not start the levothyroxine  because she did not want medications. Her TSH is mildly elevated.    She has CKD and limits NSAIDs.  Hypertension This is a chronic problem. The current episode started more than 1 year ago. The problem has been waxing and waning since onset. The problem is uncontrolled. Associated symptoms include anxiety. Pertinent negatives include no malaise/fatigue, peripheral edema or shortness of breath. Risk factors for coronary artery disease include dyslipidemia and sedentary lifestyle. The current treatment provides moderate improvement. Identifiable causes of hypertension include a thyroid  problem.  Gastroesophageal Reflux She complains of belching and a hoarse voice. This is a chronic problem. The current episode started more than 1 year ago. The problem occurs occasionally. The symptoms are aggravated by certain foods. Associated symptoms include fatigue. She has tried a PPI for the symptoms. The treatment provided moderate relief.  Thyroid  Problem Presents for follow-up visit. Symptoms include anxiety, fatigue and hoarse voice. Patient reports no cold intolerance or depressed mood. The symptoms have been stable.  Depression        This is a chronic problem.  The current episode started more than 1 year ago.   The onset quality is gradual.   The problem occurs intermittently.  Associated symptoms include fatigue.  Associated symptoms include no helplessness, no hopelessness and not sad.  Past treatments include nothing.  Past medical history includes thyroid  problem and anxiety.   Anxiety Presents for  follow-up visit. Symptoms include excessive worry and nervous/anxious behavior. Patient reports no depressed mood or shortness of breath. Symptoms occur occasionally. The severity of symptoms is mild.        Review of Systems  Constitutional:  Positive for fatigue. Negative for malaise/fatigue.  HENT:  Positive for hoarse voice.   Respiratory:  Negative for shortness of breath.   Endocrine: Negative for cold intolerance.  Psychiatric/Behavioral:  The patient is nervous/anxious.   All other systems reviewed and are negative.  Family History  Problem Relation Age of Onset   Stroke Mother    Heart attack Father    Heart attack Brother    Diabetes Sister    Stroke Sister    COPD Daughter    Hypertension Daughter    Heart disease Son    Hypertension Son    Cancer Brother        kidney cancer   Stroke Brother    COPD Brother    Diabetes Sister    Thyroid  disease Daughter    Social History   Socioeconomic History   Marital status: Widowed    Spouse name: Not on file   Number of children: 3   Years of education: 7   Highest education level: 7th grade  Occupational History   Occupation: Retired  Tobacco Use   Smoking status: Former    Current packs/day: 0.00    Average packs/day: 0.5 packs/day for 58.0 years (29.0 ttl pk-yrs)    Types: Cigarettes    Start date: 07/23/1960    Quit date: 07/23/2018    Years since quitting: 5.6  Smokeless tobacco: Never   Tobacco comments:    down to 4-5 cigarettes a day 05/12/18  Vaping Use   Vaping status: Never Used  Substance and Sexual Activity   Alcohol use: Not Currently   Drug use: Never   Sexual activity: Not Currently    Birth control/protection: Surgical  Other Topics Concern   Not on file  Social History Narrative   Daughter lives next door   2 of her children are here daily   One of them comes 2-3 times per week   Social Drivers of Health   Financial Resource Strain: Low Risk  (03/17/2023)   Overall Financial  Resource Strain (CARDIA)    Difficulty of Paying Living Expenses: Not hard at all  Food Insecurity: No Food Insecurity (03/17/2023)   Hunger Vital Sign    Worried About Running Out of Food in the Last Year: Never true    Ran Out of Food in the Last Year: Never true  Transportation Needs: No Transportation Needs (03/17/2023)   PRAPARE - Administrator, Civil Service (Medical): No    Lack of Transportation (Non-Medical): No  Physical Activity: Insufficiently Active (03/17/2023)   Exercise Vital Sign    Days of Exercise per Week: 3 days    Minutes of Exercise per Session: 30 min  Stress: No Stress Concern Present (03/17/2023)   [employer] of Occupational Health - Occupational Stress Questionnaire    Feeling of Stress : Not at all  Social Connections: Moderately Isolated (03/17/2023)   Social Connection and Isolation Panel    Frequency of Communication with Friends and Family: More than three times a week    Frequency of Social Gatherings with Friends and Family: More than three times a week    Attends Religious Services: 1 to 4 times per year    Active Member of [employer] or Organizations: No    Attends Banker Meetings: Never    Marital Status: Widowed       Objective:   Physical Exam Vitals reviewed.  Constitutional:      General: She is not in acute distress.    Appearance: She is well-developed.  HENT:     Head: Normocephalic and atraumatic.     Right Ear: Tympanic membrane normal.     Left Ear: Tympanic membrane normal.  Eyes:     Pupils: Pupils are equal, round, and reactive to light.  Neck:     Thyroid : No thyromegaly.  Cardiovascular:     Rate and Rhythm: Normal rate and regular rhythm.     Heart sounds: Normal heart sounds. No murmur heard. Pulmonary:     Effort: Pulmonary effort is normal. No respiratory distress.     Breath sounds: Normal breath sounds. No wheezing.  Abdominal:     General: Bowel sounds are normal. There is no distension.      Palpations: Abdomen is soft.     Tenderness: There is no abdominal tenderness.  Musculoskeletal:        General: No tenderness. Normal range of motion.     Cervical back: Normal range of motion and neck supple.  Skin:    General: Skin is warm and dry.  Neurological:     Mental Status: She is alert and oriented to person, place, and time.     Cranial Nerves: No cranial nerve deficit.     Deep Tendon Reflexes: Reflexes are normal and symmetric.  Psychiatric:        Behavior: Behavior normal.  Thought Content: Thought content normal.        Judgment: Judgment normal.     BP (!) 155/65   Pulse (!) 105   Temp 97.6 F (36.4 C)   Ht 5' 1 (1.549 m)   Wt 131 lb (59.4 kg)   SpO2 92%   BMI 24.75 kg/m        Assessment & Plan:  SHELLY SPENSER comes in today with chief complaint of Medical Management of Chronic Issues   Diagnosis and orders addressed:  1. Anxiety (Primary) - ALPRAZolam  (XANAX ) 0.5 MG tablet; Take 1 tablet (0.5 mg total) by mouth 2 (two) times daily as needed for anxiety.  Dispense: 45 tablet; Refill: 2 - ToxASSURE Select 13 (MW), Urine  2. Controlled substance agreement signed - ALPRAZolam  (XANAX ) 0.5 MG tablet; Take 1 tablet (0.5 mg total) by mouth 2 (two) times daily as needed for anxiety.  Dispense: 45 tablet; Refill: 2 - ToxASSURE Select 13 (MW), Urine  3. Benzodiazepine dependence (HCC)  - ALPRAZolam  (XANAX ) 0.5 MG tablet; Take 1 tablet (0.5 mg total) by mouth 2 (two) times daily as needed for anxiety.  Dispense: 45 tablet; Refill: 2 - ToxASSURE Select 13 (MW), Urine  4. Essential hypertension  5. Depression, recurrent (HCC)  6. Moderate COPD (chronic obstructive pulmonary disease) (HCC)   7. Vitamin B 12 deficiency  8. Gastroesophageal reflux disease with esophagitis, unspecified whether hemorrhage  9. Stage 3b chronic kidney disease (HCC)  10. Acquired hypothyroidism  Labs pending Denies any SOB at this time and refuses any  long term inhaler. Will use albuterol  as needed.  Patient reviewed in [place] controlled database, no flags noted. Contract and drug screen are up to date.  Health Maintenance reviewed Diet and exercise encouraged  Follow up plan: 3 months   Bari Learn, FNP

## 2024-03-22 LAB — TOXASSURE SELECT 13 (MW), URINE

## 2024-03-25 ENCOUNTER — Ambulatory Visit: Payer: Self-pay | Admitting: Family

## 2024-04-15 ENCOUNTER — Ambulatory Visit

## 2024-04-15 VITALS — BP 130/70 | HR 80 | Ht 61.0 in | Wt 131.0 lb

## 2024-04-15 DIAGNOSIS — Z0000 Encounter for general adult medical examination without abnormal findings: Secondary | ICD-10-CM | POA: Diagnosis not present

## 2024-04-15 NOTE — Patient Instructions (Signed)
 [name],  Thank you for taking the time for your Medicare Wellness Visit. I appreciate your continued commitment to your health goals. Please review the care plan we discussed, and feel free to reach out if I can assist you further.  Medicare recommends these wellness visits once per year to help you and your care team stay ahead of potential health issues. These visits are designed to focus on prevention, allowing your provider to concentrate on managing your acute and chronic conditions during your regular appointments.  Please note that Annual Wellness Visits do not include a physical exam. Some assessments may be limited, especially if the visit was conducted virtually. If needed, we may recommend a separate in-person follow-up with your provider.  Ongoing Care Seeing your primary care provider every 3 to 6 months helps us  monitor your health and provide consistent, personalized care.   Referrals If a referral was made during today's visit and you haven't received any updates within two weeks, please contact the referred provider directly to check on the status.  Recommended Screenings:  Health Maintenance  Topic Date Due   Medicare Annual Wellness Visit  03/16/2024   Zoster (Shingles) Vaccine (1 of 2) 06/18/2024*   Pneumococcal Vaccine for age over 37 (1 of 2 - PCV) 09/14/2024*   Flu Shot  10/18/2024*   DEXA scan (bone density measurement)  10/21/2024*   DTaP/Tdap/Td vaccine (2 - Td or Tdap) 07/01/2030   HPV Vaccine  Aged Out   Meningitis B Vaccine  Aged Out   COVID-19 Vaccine  Discontinued  *Topic was postponed. The date shown is not the original due date.       04/15/2024   11:29 AM  Advanced Directives  Does Patient Have a Medical Advance Directive? Yes  Type of Estate agent of [place];Living will  Copy of Healthcare Power of Attorney in Chart? No - copy requested   Advance Care Planning is important because it: Ensures you receive medical care  that aligns with your values, goals, and preferences. Provides guidance to your family and loved ones, reducing the emotional burden of decision-making during critical moments.  Vision: Annual vision screenings are recommended for early detection of glaucoma, cataracts, and diabetic retinopathy. These exams can also reveal signs of chronic conditions such as diabetes and high blood pressure.  Dental: Annual dental screenings help detect early signs of oral cancer, gum disease, and other conditions linked to overall health, including heart disease and diabetes.  Please see the attached documents for additional preventive care recommendations.

## 2024-04-15 NOTE — Progress Notes (Signed)
 Subjective:   Kelly Peters is a 89 y.o. who presents for a Medicare Wellness preventive visit.  As a reminder, Annual Wellness Visits don't include a physical exam, and some assessments may be limited, especially if this visit is performed virtually. We may recommend an in-person follow-up visit with your provider if needed.  Visit Complete: Virtual I connected with  Kelly Peters on 04/15/24 by a audio enabled telemedicine application and verified that I am speaking with the correct person using two identifiers.  Patient Location: Home  Provider Location: Home Office  I discussed the limitations of evaluation and management by telemedicine. The patient expressed understanding and agreed to proceed.  Vital Signs: Because this visit was a virtual/telehealth visit, some criteria may be missing or patient reported. Any vitals not documented were not able to be obtained and vitals that have been documented are patient reported.  VideoDeclined- This patient declined Librarian, academic. Therefore the visit was completed with audio only.  Persons Participating in Visit: Patient.  AWV Questionnaire: No: Patient Medicare AWV questionnaire was not completed prior to this visit.  Cardiac Risk Factors include: advanced age (>42men, >93 women);hypertension     Objective:    Today's Vitals   04/15/24 1120  BP: 130/70  Pulse: 80  Weight: 131 lb (59.4 kg)  Height: 5' 1 (1.549 m)   Body mass index is 24.75 kg/m.     04/15/2024   11:29 AM 03/17/2023    8:29 AM 02/26/2022   11:29 AM 12/19/2020    8:29 AM 07/01/2020    2:12 PM 07/01/2020   11:18 AM 12/15/2019    8:52 AM  Advanced Directives  Does Patient Have a Medical Advance Directive? Yes Yes Yes Yes Yes Yes Yes  Type of Estate agent of [place];Living will Healthcare Power of [place];Living will Healthcare Power of [place];Living will Living will Living will Living will Healthcare  Power of [place];Living will  Does patient want to make changes to medical advance directive?     No - Patient declined  No - Patient declined  Copy of Healthcare Power of Attorney in Chart? No - copy requested No - copy requested No - copy requested    No - copy requested  Would patient like information on creating a medical advance directive?       No - Patient declined    Current Medications (verified) Outpatient Encounter Medications as of 04/15/2024  Medication Sig   albuterol  (VENTOLIN  HFA) 108 (90 Base) MCG/ACT inhaler Inhale 2 puffs into the lungs QID.   ALPRAZolam  (XANAX ) 0.5 MG tablet Take 1 tablet (0.5 mg total) by mouth 2 (two) times daily as needed for anxiety.   amLODipine  (NORVASC ) 5 MG tablet TAKE ONE (1) TABLET BY MOUTH EVERY DAY   aspirin  EC 81 MG tablet Take 81 mg by mouth daily.   clobetasol  cream (TEMOVATE ) 0.05 % Apply 1 Application topically 2 (two) times daily.   omeprazole  (PRILOSEC) 20 MG capsule Take 1 capsule (20 mg total) by mouth daily.   levothyroxine  (SYNTHROID ) 50 MCG tablet Take 1 tablet (50 mcg total) by mouth daily. (Patient not taking: Reported on 04/15/2024)   No facility-administered encounter medications on file as of 04/15/2024.    Allergies (verified) Penicillins   History: Past Medical History:  Diagnosis Date   Allergy    Anxiety    COPD (chronic obstructive pulmonary disease) (HCC)    Depression    Hypertension    Past Surgical History:  Procedure Laterality Date   ABDOMINAL HYSTERECTOMY     CATARACT EXTRACTION W/PHACO Left 10/03/2016   Procedure: CATARACT EXTRACTION PHACO AND INTRAOCULAR LENS PLACEMENT LEFT EYE CDE - 25.42;  Surgeon: Lynwood Hermann, MD;  Location: [facility];  Service: Ophthalmology;  Laterality: Left;  left   CATARACT EXTRACTION W/PHACO Right 10/24/2016   Procedure: CATARACT EXTRACTION PHACO AND INTRAOCULAR LENS PLACEMENT RIGHT EYE CDE= 24.99;  Surgeon: Lynwood Hermann, MD;  Location: [facility];  Service: Ophthalmology;   Laterality: Right;  right   Family History  Problem Relation Age of Onset   Stroke Mother    Heart attack Father    Heart attack Brother    Diabetes Sister    Stroke Sister    COPD Daughter    Hypertension Daughter    Heart disease Son    Hypertension Son    Cancer Brother        kidney cancer   Stroke Brother    COPD Brother    Diabetes Sister    Thyroid  disease Daughter    Social History   Socioeconomic History   Marital status: Widowed    Spouse name: Not on file   Number of children: 3   Years of education: 7   Highest education level: 7th grade  Occupational History   Occupation: Retired  Tobacco Use   Smoking status: Former    Current packs/day: 0.00    Average packs/day: 0.5 packs/day for 58.0 years (29.0 ttl pk-yrs)    Types: Cigarettes    Start date: 07/23/1960    Quit date: 07/23/2018    Years since quitting: 5.7   Smokeless tobacco: Never   Tobacco comments:    down to 4-5 cigarettes a day 05/12/18  Vaping Use   Vaping status: Never Used  Substance and Sexual Activity   Alcohol use: Not Currently   Drug use: Never   Sexual activity: Not Currently    Birth control/protection: Surgical  Other Topics Concern   Not on file  Social History Narrative   Daughter lives next door   2 of her children are here daily   One of them comes 2-3 times per week   Social Drivers of Health   Financial Resource Strain: Low Risk  (04/15/2024)   Overall Financial Resource Strain (CARDIA)    Difficulty of Paying Living Expenses: Not hard at all  Food Insecurity: No Food Insecurity (04/15/2024)   Hunger Vital Sign    Worried About Running Out of Food in the Last Year: Never true    Ran Out of Food in the Last Year: Never true  Transportation Needs: No Transportation Needs (04/15/2024)   PRAPARE - Administrator, Civil Service (Medical): No    Lack of Transportation (Non-Medical): No  Physical Activity: Inactive (04/15/2024)   Exercise Vital Sign    Days of  Exercise per Week: 0 days    Minutes of Exercise per Session: 0 min  Stress: No Stress Concern Present (04/15/2024)   [employer] of Occupational Health - Occupational Stress Questionnaire    Feeling of Stress: Not at all  Social Connections: Socially Isolated (04/15/2024)   Social Connection and Isolation Panel    Frequency of Communication with Friends and Family: More than three times a week    Frequency of Social Gatherings with Friends and Family: More than three times a week    Attends Religious Services: Never    Database administrator or Organizations: No    Attends Club or  Organization Meetings: Never    Marital Status: Widowed    Tobacco Counseling Counseling given: Yes Tobacco comments: down to 4-5 cigarettes a day 05/12/18    Clinical Intake:  Pre-visit preparation completed: Yes  Pain : No/denies pain     BMI - recorded: 24.75 Nutritional Status: BMI of 19-24  Normal Nutritional Risks: None Diabetes: No  No results found for: HGBA1C   How often do you need to have someone help you when you read instructions, pamphlets, or other written materials from your doctor or pharmacy?: 1 - Never  Interpreter Needed?: No  Information entered by :: alia t/cma   Activities of Daily Living     04/15/2024   11:24 AM  In your present state of health, do you have any difficulty performing the following activities:  Hearing? 1  Vision? 0  Difficulty concentrating or making decisions? 0  Walking or climbing stairs? 0  Dressing or bathing? 0  Doing errands, shopping? 0  Preparing Food and eating ? N  Using the Toilet? N  In the past six months, have you accidently leaked urine? Y  Do you have problems with loss of bowel control? N  Managing your Medications? N  Managing your Finances? N  Housekeeping or managing your Housekeeping? N    Patient Care Team: Lavell Bari LABOR, FNP as PCP - General (Family Medicine)  I have updated your Care Teams any recent  Medical Services you may have received from other providers in the past year.     Assessment:   This is a routine wellness examination for Kelly Peters.  Hearing/Vision screen Hearing Screening - Comments:: Pt use hearing aids Vision Screening - Comments:: Pt denies vision dif/pt goes to [facility] in [place]/last ov been several yrs ago   Goals Addressed   None    Depression Screen     04/15/2024   11:30 AM 03/18/2024   10:33 AM 10/22/2023   12:03 PM 04/14/2023   10:43 AM 03/17/2023    8:28 AM 01/09/2023   11:27 AM 08/11/2022   11:28 AM  PHQ 2/9 Scores  PHQ - 2 Score 0 0 0 0 0 0 0  PHQ- 9 Score 0 0 0 0  0 0    Fall Risk     04/15/2024   11:22 AM 03/18/2024   10:33 AM 04/14/2023   10:42 AM 03/17/2023    8:27 AM 01/09/2023   11:27 AM  Fall Risk   Falls in the past year? 0 0 0 0 0  Number falls in past yr: 0 0 0 0 0  Injury with Fall? 0 0 0 0 0  Risk for fall due to : No Fall Risks No Fall Risks No Fall Risks No Fall Risks Orthopedic patient  Follow up Falls evaluation completed  Falls evaluation completed;Education provided Falls prevention discussed Falls evaluation completed;Education provided    MEDICARE RISK AT HOME:  Medicare Risk at Home Any stairs in or around the home?: Yes If so, are there any without handrails?: Yes Home free of loose throw rugs in walkways, pet beds, electrical cords, etc?: Yes Adequate lighting in your home to reduce risk of falls?: Yes Life alert?: No Use of a cane, walker or w/c?: No Grab bars in the bathroom?: No Shower chair or bench in shower?: Yes Elevated toilet seat or a handicapped toilet?: Yes  TIMED UP AND GO:  Was the test performed?  No  Cognitive Function: 6CIT completed        03/17/2023  8:30 AM 02/26/2022   11:31 AM 12/19/2020    8:32 AM 12/15/2019    8:58 AM 12/14/2018    8:54 AM  6CIT Screen  What Year? 0 points 0 points 0 points 0 points 0 points  What month? 0 points 0 points 0 points 0 points 0 points  What time? 0  points 0 points 0 points 0 points 0 points  Count back from 20 0 points 0 points 2 points 0 points 2 points  Months in reverse 0 points 0 points 2 points 0 points 0 points  Repeat phrase 0 points 0 points 0 points 2 points 2 points  Total Score 0 points 0 points 4 points 2 points 4 points    Immunizations Immunization History  Administered Date(s) Administered   INFLUENZA, HIGH DOSE SEASONAL PF 04/21/2018   Tdap 07/01/2020    Screening Tests Health Maintenance  Topic Date Due   Zoster Vaccines- Shingrix (1 of 2) 06/18/2024 (Originally 01/30/1949)   Pneumococcal Vaccine: 50+ Years (1 of 2 - PCV) 09/14/2024 (Originally 01/30/1949)   Influenza Vaccine  10/18/2024 (Originally 02/19/2024)   DEXA SCAN  10/21/2024 (Originally 01/29/1997)   Medicare Annual Wellness (AWV)  04/15/2025   DTaP/Tdap/Td (2 - Td or Tdap) 07/01/2030   HPV VACCINES  Aged Out   Meningococcal B Vaccine  Aged Out   COVID-19 Vaccine  Discontinued    Health Maintenance Items Addressed: See Nurse Notes at the end of this note  Additional Screening:  Vision Screening: Recommended annual ophthalmology exams for early detection of glaucoma and other disorders of the eye. Is the patient up to date with their annual eye exam?  Yes  Who is the provider or what is the name of the office in which the patient attends annual eye exams? [facility]  Dental Screening: Recommended annual dental exams for proper oral hygiene  Community Resource Referral / Chronic Care Management: CRR required this visit?  No   CCM required this visit?  No   Plan:    I have personally reviewed and noted the following in the patient's chart:   Medical and social history Use of alcohol, tobacco or illicit drugs  Current medications and supplements including opioid prescriptions. Patient is not currently taking opioid prescriptions. Functional ability and status Nutritional status Physical activity Advanced directives List of  other physicians Hospitalizations, surgeries, and ER visits in previous 12 months Vitals Screenings to include cognitive, depression, and falls Referrals and appointments  In addition, I have reviewed and discussed with patient certain preventive protocols, quality metrics, and best practice recommendations. A written personalized care plan for preventive services as well as general preventive health recommendations were provided to patient.   Ozie Ned, CMA   04/15/2024   After Visit Summary: (Declined) Due to this being a telephonic visit, with patients personalized plan was offered to patient but patient Declined AVS at this time   Notes: Nothing significant to report at this time.

## 2024-04-25 ENCOUNTER — Other Ambulatory Visit: Payer: Self-pay | Admitting: Family

## 2024-04-25 DIAGNOSIS — I10 Essential (primary) hypertension: Secondary | ICD-10-CM

## 2024-05-23 ENCOUNTER — Other Ambulatory Visit: Payer: Self-pay | Admitting: *Deleted

## 2024-05-23 DIAGNOSIS — F419 Anxiety disorder, unspecified: Secondary | ICD-10-CM

## 2024-05-23 DIAGNOSIS — Z79899 Other long term (current) drug therapy: Secondary | ICD-10-CM

## 2024-05-23 DIAGNOSIS — F1320 Sedative, hypnotic or anxiolytic dependence, uncomplicated: Secondary | ICD-10-CM

## 2024-06-20 ENCOUNTER — Ambulatory Visit: Payer: Self-pay | Admitting: Family

## 2024-06-20 ENCOUNTER — Encounter: Payer: Self-pay | Admitting: Family

## 2024-06-20 VITALS — BP 143/73 | HR 103 | Temp 98.4°F | Ht 61.0 in | Wt 131.4 lb

## 2024-06-20 DIAGNOSIS — F339 Major depressive disorder, recurrent, unspecified: Secondary | ICD-10-CM

## 2024-06-20 DIAGNOSIS — N1832 Chronic kidney disease, stage 3b: Secondary | ICD-10-CM

## 2024-06-20 DIAGNOSIS — F1320 Sedative, hypnotic or anxiolytic dependence, uncomplicated: Secondary | ICD-10-CM

## 2024-06-20 DIAGNOSIS — E039 Hypothyroidism, unspecified: Secondary | ICD-10-CM

## 2024-06-20 DIAGNOSIS — F419 Anxiety disorder, unspecified: Secondary | ICD-10-CM | POA: Diagnosis not present

## 2024-06-20 DIAGNOSIS — K2100 Gastro-esophageal reflux disease with esophagitis, without bleeding: Secondary | ICD-10-CM

## 2024-06-20 DIAGNOSIS — I10 Essential (primary) hypertension: Secondary | ICD-10-CM | POA: Diagnosis not present

## 2024-06-20 DIAGNOSIS — J449 Chronic obstructive pulmonary disease, unspecified: Secondary | ICD-10-CM

## 2024-06-20 DIAGNOSIS — Z79899 Other long term (current) drug therapy: Secondary | ICD-10-CM

## 2024-06-20 DIAGNOSIS — E538 Deficiency of other specified B group vitamins: Secondary | ICD-10-CM

## 2024-06-20 LAB — CMP14+EGFR
ALT: 7 IU/L (ref 0–32)
AST: 15 IU/L (ref 0–40)
Albumin: 4.5 g/dL (ref 3.6–4.6)
Alkaline Phosphatase: 127 IU/L (ref 48–129)
BUN/Creatinine Ratio: 12 (ref 12–28)
BUN: 17 mg/dL (ref 10–36)
Bilirubin Total: 0.5 mg/dL (ref 0.0–1.2)
CO2: 24 mmol/L (ref 20–29)
Calcium: 9.3 mg/dL (ref 8.7–10.3)
Chloride: 100 mmol/L (ref 96–106)
Creatinine, Ser: 1.37 mg/dL — ABNORMAL HIGH (ref 0.57–1.00)
Globulin, Total: 2.7 g/dL (ref 1.5–4.5)
Glucose: 115 mg/dL — ABNORMAL HIGH (ref 70–99)
Potassium: 4.5 mmol/L (ref 3.5–5.2)
Sodium: 140 mmol/L (ref 134–144)
Total Protein: 7.2 g/dL (ref 6.0–8.5)
eGFR: 36 mL/min/1.73 — ABNORMAL LOW (ref >59)

## 2024-06-20 LAB — TSH: TSH: 6.19 u[IU]/mL — ABNORMAL HIGH (ref 0.450–4.500)

## 2024-06-20 MED ORDER — ALPRAZOLAM 0.5 MG PO TABS: 0.5000 mg | ORAL_TABLET | Freq: Two times a day (BID) | ORAL | 2 refills | Status: AC | PRN

## 2024-06-20 MED ORDER — AMLODIPINE BESYLATE 5 MG PO TABS: 5.0000 mg | ORAL_TABLET | Freq: Every day | ORAL | 2 refills | Status: AC

## 2024-06-20 NOTE — Patient Instructions (Signed)
 Hypertension, Adult High blood pressure (hypertension) is when the force of blood pumping through the arteries is too strong. The arteries are the blood vessels that carry blood from the heart throughout the body. Hypertension forces the heart to work harder to pump blood and may cause arteries to become narrow or stiff. Untreated or uncontrolled hypertension can lead to a heart attack, heart failure, a stroke, kidney disease, and other problems. A blood pressure reading consists of a higher number over a lower number. Ideally, your blood pressure should be below 120/80. The first ("top") number is called the systolic pressure. It is a measure of the pressure in your arteries as your heart beats. The second ("bottom") number is called the diastolic pressure. It is a measure of the pressure in your arteries as the heart relaxes. What are the causes? The exact cause of this condition is not known. There are some conditions that result in high blood pressure. What increases the risk? Certain factors may make you more likely to develop high blood pressure. Some of these risk factors are under your control, including: Smoking. Not getting enough exercise or physical activity. Being overweight. Having too much fat, sugar, calories, or salt (sodium) in your diet. Drinking too much alcohol. Other risk factors include: Having a personal history of heart disease, diabetes, high cholesterol, or kidney disease. Stress. Having a family history of high blood pressure and high cholesterol. Having obstructive sleep apnea. Age. The risk increases with age. What are the signs or symptoms? High blood pressure may not cause symptoms. Very high blood pressure (hypertensive crisis) may cause: Headache. Fast or irregular heartbeats (palpitations). Shortness of breath. Nosebleed. Nausea and vomiting. Vision changes. Severe chest pain, dizziness, and seizures. How is this diagnosed? This condition is diagnosed by  measuring your blood pressure while you are seated, with your arm resting on a flat surface, your legs uncrossed, and your feet flat on the floor. The cuff of the blood pressure monitor will be placed directly against the skin of your upper arm at the level of your heart. Blood pressure should be measured at least twice using the same arm. Certain conditions can cause a difference in blood pressure between your right and left arms. If you have a high blood pressure reading during one visit or you have normal blood pressure with other risk factors, you may be asked to: Return on a different day to have your blood pressure checked again. Monitor your blood pressure at home for 1 week or longer. If you are diagnosed with hypertension, you may have other blood or imaging tests to help your health care provider understand your overall risk for other conditions. How is this treated? This condition is treated by making healthy lifestyle changes, such as eating healthy foods, exercising more, and reducing your alcohol intake. You may be referred for counseling on a healthy diet and physical activity. Your health care provider may prescribe medicine if lifestyle changes are not enough to get your blood pressure under control and if: Your systolic blood pressure is above 130. Your diastolic blood pressure is above 80. Your personal target blood pressure may vary depending on your medical conditions, your age, and other factors. Follow these instructions at home: Eating and drinking  Eat a diet that is high in fiber and potassium, and low in sodium, added sugar, and fat. An example of this eating plan is called the DASH diet. DASH stands for Dietary Approaches to Stop Hypertension. To eat this way: Eat  plenty of fresh fruits and vegetables. Try to fill one half of your plate at each meal with fruits and vegetables. Eat whole grains, such as whole-wheat pasta, brown rice, or whole-grain bread. Fill about one  fourth of your plate with whole grains. Eat or drink low-fat dairy products, such as skim milk or low-fat yogurt. Avoid fatty cuts of meat, processed or cured meats, and poultry with skin. Fill about one fourth of your plate with lean proteins, such as fish, chicken without skin, beans, eggs, or tofu. Avoid pre-made and processed foods. These tend to be higher in sodium, added sugar, and fat. Reduce your daily sodium intake. Many people with hypertension should eat less than 1,500 mg of sodium a day. Do not drink alcohol if: Your health care provider tells you not to drink. You are pregnant, may be pregnant, or are planning to become pregnant. If you drink alcohol: Limit how much you have to: 0-1 drink a day for women. 0-2 drinks a day for men. Know how much alcohol is in your drink. In the [place] one drink equals one 12 oz bottle of beer (355 mL), one 5 oz glass of wine (148 mL), or one 1 oz glass of hard liquor (44 mL). Lifestyle  Work with your health care provider to maintain a healthy body weight or to lose weight. Ask what an ideal weight is for you. Get at least 30 minutes of exercise that causes your heart to beat faster (aerobic exercise) most days of the week. Activities may include walking, swimming, or biking. Include exercise to strengthen your muscles (resistance exercise), such as Pilates or lifting weights, as part of your weekly exercise routine. Try to do these types of exercises for 30 minutes at least 3 days a week. Do not use any products that contain nicotine or tobacco. These products include cigarettes, chewing tobacco, and vaping devices, such as e-cigarettes. If you need help quitting, ask your health care provider. Monitor your blood pressure at home as told by your health care provider. Keep all follow-up visits. This is important. Medicines Take over-the-counter and prescription medicines only as told by your health care provider. Follow directions carefully. Blood  pressure medicines must be taken as prescribed. Do not skip doses of blood pressure medicine. Doing this puts you at risk for problems and can make the medicine less effective. Ask your health care provider about side effects or reactions to medicines that you should watch for. Contact a health care provider if you: Think you are having a reaction to a medicine you are taking. Have headaches that keep coming back (recurring). Feel dizzy. Have swelling in your ankles. Have trouble with your vision. Get help right away if you: Develop a severe headache or confusion. Have unusual weakness or numbness. Feel faint. Have severe pain in your chest or abdomen. Vomit repeatedly. Have trouble breathing. These symptoms may be an emergency. Get help right away. Call 911. Do not wait to see if the symptoms will go away. Do not drive yourself to the hospital. Summary Hypertension is when the force of blood pumping through your arteries is too strong. If this condition is not controlled, it may put you at risk for serious complications. Your personal target blood pressure may vary depending on your medical conditions, your age, and other factors. For most people, a normal blood pressure is less than 120/80. Hypertension is treated with lifestyle changes, medicines, or a combination of both. Lifestyle changes include losing weight, eating a healthy,  low-sodium diet, exercising more, and limiting alcohol. This information is not intended to replace advice given to you by your health care provider. Make sure you discuss any questions you have with your health care provider. Document Revised: 05/14/2021 Document Reviewed: 05/14/2021 Elsevier Patient Education  2024 [employer].

## 2024-06-20 NOTE — Progress Notes (Signed)
 Subjective:    Patient ID: Kelly Peters, female    DOB: March 31, 1930, 89 y.o.   MRN: [id number]  Chief Complaint  Patient presents with   Medical Management of Chronic Issues   Pt presents to the office today for  chronic follow up.  She has COPD and states her breathing is fine. She has an albuterol , but doesn't have to use it. Quit smoking 07/2018.     She did not start the levothyroxine  because she did not want medications. Her TSH is mildly elevated.    She has CKD and limits NSAIDs.  Hypertension This is a chronic problem. The current episode started more than 1 year ago. The problem has been waxing and waning since onset. The problem is uncontrolled. Associated symptoms include anxiety. Pertinent negatives include no malaise/fatigue, peripheral edema or shortness of breath. Risk factors for coronary artery disease include dyslipidemia and sedentary lifestyle. The current treatment provides moderate improvement. Identifiable causes of hypertension include a thyroid  problem.  Gastroesophageal Reflux She complains of belching and a hoarse voice. This is a chronic problem. The current episode started more than 1 year ago. The problem occurs occasionally. The symptoms are aggravated by certain foods. Associated symptoms include fatigue. She has tried a PPI for the symptoms. The treatment provided moderate relief.  Thyroid  Problem Presents for follow-up visit. Symptoms include anxiety, fatigue and hoarse voice. Patient reports no cold intolerance or depressed mood. The symptoms have been stable.  Depression        This is a chronic problem.  The current episode started more than 1 year ago.   The onset quality is gradual.   The problem occurs intermittently.  Associated symptoms include fatigue.  Associated symptoms include no helplessness, no hopelessness and not sad.  Past treatments include nothing.  Past medical history includes thyroid  problem and anxiety.   Anxiety Presents for  follow-up visit. Symptoms include excessive worry and nervous/anxious behavior. Patient reports no depressed mood or shortness of breath. Symptoms occur occasionally. The severity of symptoms is mild.        Review of Systems  Constitutional:  Positive for fatigue. Negative for malaise/fatigue.  HENT:  Positive for hoarse voice.   Respiratory:  Negative for shortness of breath.   Endocrine: Negative for cold intolerance.  Psychiatric/Behavioral:  The patient is nervous/anxious.   All other systems reviewed and are negative.  Family History  Problem Relation Age of Onset   Stroke Mother    Heart attack Father    Heart attack Brother    Diabetes Sister    Stroke Sister    COPD Daughter    Hypertension Daughter    Heart disease Son    Hypertension Son    Cancer Brother        kidney cancer   Stroke Brother    COPD Brother    Diabetes Sister    Thyroid  disease Daughter    Social History   Socioeconomic History   Marital status: Widowed    Spouse name: Not on file   Number of children: 3   Years of education: 7   Highest education level: 7th grade  Occupational History   Occupation: Retired  Tobacco Use   Smoking status: Former    Current packs/day: 0.00    Average packs/day: 0.5 packs/day for 58.0 years (29.0 ttl pk-yrs)    Types: Cigarettes    Start date: 07/23/1960    Quit date: 07/23/2018    Years since quitting: 5.9  Smokeless tobacco: Never   Tobacco comments:    down to 4-5 cigarettes a day 05/12/18  Vaping Use   Vaping status: Never Used  Substance and Sexual Activity   Alcohol use: Not Currently   Drug use: Never   Sexual activity: Not Currently    Birth control/protection: Surgical  Other Topics Concern   Not on file  Social History Narrative   Daughter lives next door   2 of her children are here daily   One of them comes 2-3 times per week   Social Drivers of Health   Financial Resource Strain: Low Risk  (04/15/2024)   Overall Financial  Resource Strain (CARDIA)    Difficulty of Paying Living Expenses: Not hard at all  Food Insecurity: No Food Insecurity (04/15/2024)   Hunger Vital Sign    Worried About Running Out of Food in the Last Year: Never true    Ran Out of Food in the Last Year: Never true  Transportation Needs: No Transportation Needs (04/15/2024)   PRAPARE - Administrator, Civil Service (Medical): No    Lack of Transportation (Non-Medical): No  Physical Activity: Inactive (04/15/2024)   Exercise Vital Sign    Days of Exercise per Week: 0 days    Minutes of Exercise per Session: 0 min  Stress: No Stress Concern Present (04/15/2024)   [employer] of Occupational Health - Occupational Stress Questionnaire    Feeling of Stress: Not at all  Social Connections: Socially Isolated (04/15/2024)   Social Connection and Isolation Panel    Frequency of Communication with Friends and Family: More than three times a week    Frequency of Social Gatherings with Friends and Family: More than three times a week    Attends Religious Services: Never    Database Administrator or Organizations: No    Attends Banker Meetings: Never    Marital Status: Widowed       Objective:   Physical Exam Vitals reviewed.  Constitutional:      General: She is not in acute distress.    Appearance: She is well-developed.  HENT:     Head: Normocephalic and atraumatic.     Right Ear: Tympanic membrane normal.     Left Ear: Tympanic membrane normal.  Eyes:     Pupils: Pupils are equal, round, and reactive to light.  Neck:     Thyroid : No thyromegaly.  Cardiovascular:     Rate and Rhythm: Normal rate and regular rhythm.     Heart sounds: Normal heart sounds. No murmur heard. Pulmonary:     Effort: Pulmonary effort is normal. No respiratory distress.     Breath sounds: Normal breath sounds. No wheezing.  Abdominal:     General: Bowel sounds are normal. There is no distension.     Palpations: Abdomen is  soft.     Tenderness: There is no abdominal tenderness.  Musculoskeletal:        General: No tenderness. Normal range of motion.     Cervical back: Normal range of motion and neck supple.  Skin:    General: Skin is warm and dry.  Neurological:     Mental Status: She is alert and oriented to person, place, and time.     Cranial Nerves: No cranial nerve deficit.     Deep Tendon Reflexes: Reflexes are normal and symmetric.  Psychiatric:        Behavior: Behavior normal.        Thought  Content: Thought content normal.        Judgment: Judgment normal.     BP (!) 171/85   Pulse (!) 103   Temp 98.4 F (36.9 C) (Temporal)   Ht 5' 1 (1.549 m)   Wt 131 lb 6.4 oz (59.6 kg)   SpO2 93%   BMI 24.83 kg/m        Assessment & Plan:  JOSEFINE FUHR comes in today with chief complaint of Medical Management of Chronic Issues   Diagnosis and orders addressed:  1. Anxiety - ALPRAZolam  (XANAX ) 0.5 MG tablet; Take 1 tablet (0.5 mg total) by mouth 2 (two) times daily as needed for anxiety.  Dispense: 45 tablet; Refill: 2 - CMP14+EGFR  2. Controlled substance agreement signed - ALPRAZolam  (XANAX ) 0.5 MG tablet; Take 1 tablet (0.5 mg total) by mouth 2 (two) times daily as needed for anxiety.  Dispense: 45 tablet; Refill: 2 - CMP14+EGFR  3. Benzodiazepine dependence (HCC) - ALPRAZolam  (XANAX ) 0.5 MG tablet; Take 1 tablet (0.5 mg total) by mouth 2 (two) times daily as needed for anxiety.  Dispense: 45 tablet; Refill: 2 - CMP14+EGFR  4. Essential hypertension (Primary)  - amLODipine  (NORVASC ) 5 MG tablet; Take 1 tablet (5 mg total) by mouth daily.  Dispense: 90 tablet; Refill: 2 - CMP14+EGFR  5. Moderate COPD (chronic obstructive pulmonary disease) (HCC) - CMP14+EGFR  6. Stage 3b chronic kidney disease (HCC) - CMP14+EGFR  7. Vitamin B 12 deficiency  - CMP14+EGFR  8. Gastroesophageal reflux disease with esophagitis, unspecified whether hemorrhage  - CMP14+EGFR  9. Depression,  recurrent - CMP14+EGFR  10. Acquired hypothyroidism - TSH   Labs pending Denies any SOB at this time and refuses any long term inhaler. Will use albuterol  as needed.  Patient reviewed in [place] controlled database, no flags noted. Contract and drug screen are up to date.  Health Maintenance reviewed Diet and exercise encouraged  Follow up plan: 3 months   Bari Learn, FNP

## 2024-06-21 ENCOUNTER — Ambulatory Visit: Payer: Self-pay | Admitting: Family

## 2024-08-19 ENCOUNTER — Other Ambulatory Visit: Payer: Self-pay | Admitting: Family

## 2024-08-19 DIAGNOSIS — F1320 Sedative, hypnotic or anxiolytic dependence, uncomplicated: Secondary | ICD-10-CM

## 2024-08-19 DIAGNOSIS — Z79899 Other long term (current) drug therapy: Secondary | ICD-10-CM

## 2024-08-19 DIAGNOSIS — F419 Anxiety disorder, unspecified: Secondary | ICD-10-CM

## 2024-09-19 ENCOUNTER — Ambulatory Visit: Admitting: Family

## 2025-04-18 ENCOUNTER — Ambulatory Visit: Payer: Self-pay
